# Patient Record
Sex: Female | Born: 1960 | Hispanic: No | Marital: Single | Smoking: Current every day smoker
Health system: Southern US, Community
[De-identification: ages and names within clinical notes are randomized; demographics above are authoritative.]

## PROBLEM LIST (undated history)

## (undated) ENCOUNTER — Emergency Department (HOSPITAL_BASED_OUTPATIENT_CLINIC_OR_DEPARTMENT_OTHER): Admission: EM | Payer: Self-pay | Source: Home / Self Care

## (undated) DIAGNOSIS — K8020 Calculus of gallbladder without cholecystitis without obstruction: Secondary | ICD-10-CM

## (undated) DIAGNOSIS — F419 Anxiety disorder, unspecified: Secondary | ICD-10-CM

## (undated) DIAGNOSIS — F32A Depression, unspecified: Secondary | ICD-10-CM

## (undated) DIAGNOSIS — B1920 Unspecified viral hepatitis C without hepatic coma: Secondary | ICD-10-CM

## (undated) DIAGNOSIS — K5790 Diverticulosis of intestine, part unspecified, without perforation or abscess without bleeding: Secondary | ICD-10-CM

## (undated) DIAGNOSIS — I10 Essential (primary) hypertension: Secondary | ICD-10-CM

## (undated) DIAGNOSIS — F329 Major depressive disorder, single episode, unspecified: Secondary | ICD-10-CM

## (undated) DIAGNOSIS — M1990 Unspecified osteoarthritis, unspecified site: Secondary | ICD-10-CM

## (undated) DIAGNOSIS — M25569 Pain in unspecified knee: Secondary | ICD-10-CM

## (undated) DIAGNOSIS — A539 Syphilis, unspecified: Secondary | ICD-10-CM

## (undated) DIAGNOSIS — G8929 Other chronic pain: Secondary | ICD-10-CM

## (undated) HISTORY — DX: Unspecified viral hepatitis C without hepatic coma: B19.20

## (undated) HISTORY — DX: Syphilis, unspecified: A53.9

## (undated) HISTORY — DX: Diverticulosis of intestine, part unspecified, without perforation or abscess without bleeding: K57.90

## (undated) HISTORY — PX: ECTOPIC PREGNANCY SURGERY: SHX613

## (undated) HISTORY — PX: CARPAL TUNNEL RELEASE: SHX101

## (undated) HISTORY — DX: Unspecified osteoarthritis, unspecified site: M19.90

---

## 2004-03-22 ENCOUNTER — Emergency Department: Payer: Self-pay | Admitting: Emergency Medicine

## 2004-04-01 ENCOUNTER — Ambulatory Visit: Payer: Self-pay | Admitting: *Deleted

## 2004-10-07 ENCOUNTER — Emergency Department: Payer: Self-pay | Admitting: Emergency Medicine

## 2004-11-15 ENCOUNTER — Emergency Department: Payer: Self-pay | Admitting: Emergency Medicine

## 2005-04-20 ENCOUNTER — Emergency Department: Payer: Self-pay | Admitting: Emergency Medicine

## 2005-08-20 ENCOUNTER — Other Ambulatory Visit: Admission: RE | Admit: 2005-08-20 | Discharge: 2005-08-20 | Payer: Self-pay | Admitting: Family Medicine

## 2005-11-25 ENCOUNTER — Emergency Department (HOSPITAL_COMMUNITY): Admission: EM | Admit: 2005-11-25 | Discharge: 2005-11-25 | Payer: Self-pay | Admitting: Emergency Medicine

## 2005-12-28 ENCOUNTER — Emergency Department (HOSPITAL_COMMUNITY): Admission: EM | Admit: 2005-12-28 | Discharge: 2005-12-28 | Payer: Self-pay | Admitting: Emergency Medicine

## 2006-02-04 ENCOUNTER — Encounter: Admission: RE | Admit: 2006-02-04 | Discharge: 2006-02-04 | Payer: Self-pay | Admitting: Internal Medicine

## 2006-08-17 ENCOUNTER — Encounter: Admission: RE | Admit: 2006-08-17 | Discharge: 2006-08-17 | Payer: Self-pay | Admitting: Internal Medicine

## 2006-09-10 ENCOUNTER — Emergency Department (HOSPITAL_COMMUNITY): Admission: EM | Admit: 2006-09-10 | Discharge: 2006-09-10 | Payer: Self-pay | Admitting: Family Medicine

## 2006-10-28 ENCOUNTER — Emergency Department (HOSPITAL_COMMUNITY): Admission: EM | Admit: 2006-10-28 | Discharge: 2006-10-28 | Payer: Self-pay | Admitting: Family Medicine

## 2006-12-27 ENCOUNTER — Encounter: Admission: RE | Admit: 2006-12-27 | Discharge: 2006-12-27 | Payer: Self-pay | Admitting: Neurological Surgery

## 2007-02-18 ENCOUNTER — Telehealth (INDEPENDENT_AMBULATORY_CARE_PROVIDER_SITE_OTHER): Payer: Self-pay | Admitting: *Deleted

## 2007-02-22 ENCOUNTER — Emergency Department (HOSPITAL_COMMUNITY): Admission: EM | Admit: 2007-02-22 | Discharge: 2007-02-22 | Payer: Self-pay | Admitting: Emergency Medicine

## 2007-04-03 ENCOUNTER — Emergency Department (HOSPITAL_COMMUNITY): Admission: EM | Admit: 2007-04-03 | Discharge: 2007-04-03 | Payer: Self-pay | Admitting: Emergency Medicine

## 2007-07-20 ENCOUNTER — Ambulatory Visit (HOSPITAL_COMMUNITY): Admission: RE | Admit: 2007-07-20 | Discharge: 2007-07-20 | Payer: Self-pay | Admitting: Family Medicine

## 2007-09-27 ENCOUNTER — Emergency Department (HOSPITAL_COMMUNITY): Admission: EM | Admit: 2007-09-27 | Discharge: 2007-09-27 | Payer: Self-pay | Admitting: Emergency Medicine

## 2007-12-13 ENCOUNTER — Ambulatory Visit (HOSPITAL_COMMUNITY): Admission: RE | Admit: 2007-12-13 | Discharge: 2007-12-13 | Payer: Self-pay | Admitting: Orthopaedic Surgery

## 2007-12-25 ENCOUNTER — Emergency Department: Payer: Self-pay | Admitting: Internal Medicine

## 2008-01-05 ENCOUNTER — Inpatient Hospital Stay (HOSPITAL_COMMUNITY): Admission: AD | Admit: 2008-01-05 | Discharge: 2008-01-05 | Payer: Self-pay | Admitting: Gynecology

## 2008-01-31 ENCOUNTER — Ambulatory Visit: Payer: Self-pay | Admitting: Psychiatry

## 2008-01-31 ENCOUNTER — Emergency Department (HOSPITAL_COMMUNITY): Admission: EM | Admit: 2008-01-31 | Discharge: 2008-02-01 | Payer: Self-pay | Admitting: Emergency Medicine

## 2008-02-01 ENCOUNTER — Inpatient Hospital Stay (HOSPITAL_COMMUNITY): Admission: RE | Admit: 2008-02-01 | Discharge: 2008-02-02 | Payer: Self-pay | Admitting: Psychiatry

## 2008-02-09 ENCOUNTER — Ambulatory Visit (HOSPITAL_COMMUNITY): Payer: Self-pay | Admitting: Psychiatry

## 2008-03-20 ENCOUNTER — Ambulatory Visit (HOSPITAL_COMMUNITY): Payer: Self-pay | Admitting: Psychiatry

## 2008-04-16 ENCOUNTER — Other Ambulatory Visit (HOSPITAL_COMMUNITY): Admission: RE | Admit: 2008-04-16 | Discharge: 2008-05-03 | Payer: Self-pay | Admitting: Psychiatry

## 2008-04-17 ENCOUNTER — Other Ambulatory Visit (HOSPITAL_COMMUNITY): Payer: Self-pay | Admitting: Psychiatry

## 2008-05-14 ENCOUNTER — Inpatient Hospital Stay (HOSPITAL_COMMUNITY): Admission: AD | Admit: 2008-05-14 | Discharge: 2008-05-14 | Payer: Self-pay | Admitting: Family Medicine

## 2008-08-27 ENCOUNTER — Emergency Department: Payer: Self-pay | Admitting: Emergency Medicine

## 2008-09-01 ENCOUNTER — Emergency Department: Payer: Self-pay | Admitting: Emergency Medicine

## 2008-09-09 ENCOUNTER — Emergency Department: Payer: Self-pay | Admitting: Emergency Medicine

## 2008-11-26 ENCOUNTER — Emergency Department (HOSPITAL_COMMUNITY): Admission: EM | Admit: 2008-11-26 | Discharge: 2008-11-26 | Payer: Self-pay | Admitting: Emergency Medicine

## 2009-07-20 ENCOUNTER — Emergency Department (HOSPITAL_COMMUNITY): Admission: EM | Admit: 2009-07-20 | Discharge: 2009-07-20 | Payer: Self-pay | Admitting: Emergency Medicine

## 2009-07-29 ENCOUNTER — Emergency Department (HOSPITAL_COMMUNITY): Admission: EM | Admit: 2009-07-29 | Discharge: 2009-07-29 | Payer: Self-pay | Admitting: Family Medicine

## 2010-04-02 ENCOUNTER — Emergency Department (HOSPITAL_COMMUNITY)
Admission: EM | Admit: 2010-04-02 | Discharge: 2010-04-02 | Payer: Self-pay | Source: Home / Self Care | Admitting: Emergency Medicine

## 2010-05-09 ENCOUNTER — Ambulatory Visit (HOSPITAL_COMMUNITY)
Admission: RE | Admit: 2010-05-09 | Discharge: 2010-05-09 | Payer: Self-pay | Source: Home / Self Care | Attending: Internal Medicine | Admitting: Internal Medicine

## 2010-06-15 ENCOUNTER — Encounter: Payer: Self-pay | Admitting: Family Medicine

## 2010-08-05 LAB — URINALYSIS, ROUTINE W REFLEX MICROSCOPIC
Glucose, UA: NEGATIVE mg/dL
Hgb urine dipstick: NEGATIVE
pH: 7 (ref 5.0–8.0)

## 2010-08-05 LAB — POCT CARDIAC MARKERS: Troponin i, poc: <0.05 ng/mL (ref 0.00–0.09)

## 2010-08-05 LAB — CBC
HCT: 38.4 % (ref 36.0–46.0)
Hemoglobin: 13.3 g/dL (ref 12.0–15.0)
MCH: 31.1 pg (ref 26.0–34.0)
MCV: 89.8 fL (ref 78.0–100.0)
Platelets: 229 10*3/uL (ref 150–400)
RBC: 4.28 MIL/uL (ref 3.87–5.11)
WBC: 9.1 10*3/uL (ref 4.0–10.5)

## 2010-08-05 LAB — HEPATIC FUNCTION PANEL
ALT: 15 U/L (ref 0–35)
AST: 21 U/L (ref 0–37)
Albumin: 3.2 g/dL — ABNORMAL LOW (ref 3.5–5.2)
Total Bilirubin: 0.2 mg/dL — ABNORMAL LOW (ref 0.3–1.2)

## 2010-08-05 LAB — BASIC METABOLIC PANEL
CO2: 28 mEq/L (ref 19–32)
Chloride: 105 mEq/L (ref 96–112)
GFR calc Af Amer: >60 mL/min (ref >60)
Potassium: 3.8 mEq/L (ref 3.5–5.1)
Sodium: 140 mEq/L (ref 135–145)

## 2010-08-05 LAB — PREGNANCY, URINE: Preg Test, Ur: NEGATIVE

## 2010-08-17 LAB — CBC
Hemoglobin: 14.5 g/dL (ref 12.0–15.0)
Platelets: 213 10*3/uL (ref 150–400)
RDW: 13.4 % (ref 11.5–15.5)
WBC: 8.9 10*3/uL (ref 4.0–10.5)

## 2010-08-17 LAB — POCT I-STAT, CHEM 8
BUN: 11 mg/dL (ref 6–23)
Chloride: 107 mEq/L (ref 96–112)
Glucose, Bld: 103 mg/dL — ABNORMAL HIGH (ref 70–99)
HCT: 45 % (ref 36.0–46.0)
Potassium: 4.2 mEq/L (ref 3.5–5.1)

## 2010-10-07 NOTE — Discharge Summary (Signed)
NAME:  [name], Vanessa              ACCOUNT NO.:  [id number]   MEDICAL RECORD NO.:  [id number]          PATIENT TYPE:  IPS   LOCATION:  0501                          FACILITY:  [facility]   PHYSICIAN:  Geoffery Lyons, M.D.      DATE OF BIRTH:  Dec 09, 1960   DATE OF ADMISSION:  02/01/2008  DATE OF DISCHARGE:  02/02/2008                               DISCHARGE SUMMARY   CHIEF COMPLAINT:  This was the first admission to [facility] for this 50 year old female voluntarily admitted.  History of  opiate dependence.  Has been using Vicodin up to 10 per day.  Has been  using for 4 years.  Using medication that are prescribed and buying them  off the street.  Last used the day before.  Tried to taper herself off  but developed significant withdrawal.  She has interest in going to  school and needs to get off this medication.   PAST PSYCHIATRIC HISTORY:  [facility] time at [facility].  Has been on  Zoloft in the past.   ALCOHOL AND DRUG HISTORY:  As already stated persistent use of opiates.   MEDICAL HISTORY:  Hand surgery when she got started on Vicodin.   MEDICATION:  Effexor XR, also has been on hydrochlorothiazide and  lisinopril.   Physical examination failed to show any acute findings.   LABORATORY:  Urinalysis red blood cells 3-6.  CBC within normal limits.  UDS positive for opiates.   MENTAL STATUS EXAM:  Reveals a fully alert cooperative female, good eye  contact.  Speech is clear, normal rate, tempo and production.  Endorsed  feeling weak, having some withdrawal symptoms.  Appears in no acute  distress.  No suicidal or homicidal ideas.  No hallucinations.  No  delusions.  Cognition well-preserved.   DIAGNOSES:  AXIS I:  Opiate dependence, opiate withdrawal.  AXIS II:  No diagnosis.  AXIS III:  Hypertension.  AXIS IV:  Moderate.  AXIS V:  Upon admission 35, highest GAF in the last year 70.   COURSE IN THE HOSPITAL:  She was admitted, started individual and group  psychotherapy.  We went ahead and start detox with clonidine.  She was  placed back on the Effexor.  As already stated got dependent on Vicodin  when she had surgery.  She had nausea, vomiting, diarrhea, weakness,  aches.  Has a daughter that is 89 years old.  She needs to let this go.  Has prior history of depression on Zoloft.  We started detox with  clonidine.  Detox went uneventfully.  By September 10 she felt so much  better.  Endorsed that she was ready to go, was willing to pursue the  taper back at home but said she was ready to get her life back together  and pursue outpatient followup.   AXIS I:  Opiate dependence, opiate withdrawal.  Depressive disorder not  otherwise specified.  AXIS II:  No diagnosis.  AXIS III:  Hypertension.  AXIS IV:  Moderate.  AXIS V:  Upon discharge 50/55.   Discharged home on clonidine 0.1 to taper per  protocol, Effexor XR 75 mg  per day, Librium 25 one every 6 hours as needed for anxiety, 3 capsules  given.  Follow up with Dr. Lolly Mustache and Florencia Reasons in [place].      Geoffery Lyons, M.D.  Electronically Signed     IL/MEDQ  D:  02/08/2008  T:  02/10/2008  Job:  [id number]

## 2010-10-07 NOTE — Op Note (Signed)
NAME:  Vanessa [name], Vanessa [name]              ACCOUNT NO.:  [id number]   MEDICAL RECORD NO.:  [id number]          PATIENT TYPE:  AMB   LOCATION:  DAY                           FACILITY:  [facility]   PHYSICIAN:  J. Darreld Mclean, M.D. DATE OF BIRTH:  08-Jul-1960   DATE OF PROCEDURE:  DATE OF DISCHARGE:                               OPERATIVE REPORT   PREOPERATIVE DIAGNOSIS:  Carpal tunnel syndrome, left.   POSTOPERATIVE DIAGNOSIS:  Carpal tunnel syndrome, left.   PROCEDURE:  Open release of left carpal tunnel with incision of volar  carpal ligament, saline neurolysis, epineurectomy.   ANESTHESIA:  Bier block.   TOURNIQUET TIME:  Per anesthesia.   SURGEON:  J. Darreld Mclean, MD   INDICATIONS:  The patient is a 50 year old female with pain and  tenderness in both hands, left greater than right with night  paresthesias dropping things.  She has not gotten better with  conservative treatment.  EMG show carpal tunnel syndrome on the left.  Surgery has been recommended if she is not improved with conservative  treatment.  Risks and imponderables of the procedure were discussed  preoperatively and she appears to understand and agree with the  procedure as outlined.   The patient was seen in the holding area.  She previously marked the  left wrist area, I marked the left wrist.  She was brought to the  operating room and placed supine on the operating room table.  Bier  block anesthesia was given by Anesthesia.  She was prepped and draped in  the usual manner.   We had a time-out identify the patient, identify the left hand as the  correct surgical site.  The entire staff was introduced, stated the  length of the case anticipated to make sure that all instrumentation was  properly placed, positioned, and available.   Incision was made over the volar carpal ligament area volarly and  proximally the palmaris longus was identified.  This was retracted.  The  retinaculum was entered and the median  nerve was identified proximally.  A vessel loop placed around the nerve.  A groove director was then  placed within the carpal tunnel space.  The volar carpal ligaments were  easily identified and then incised.  Nerve was obviously compressed.  The retinaculum was cut proximally.  Specimen of volar carpal ligament  was sent to pathology.  Saline neurolysis and epineurectomy carried out.  The nerve was inspected, no injury apparent.  The wound was then  reapproximated using 3-0 nylon interrupted vertical mattress manner.  The hand was cleansed with Betadine and then a sterile dressing was  applied.  Bulky dressing applied.  Ace bandage applied loosely.  The  sheet cotton was applied, was cut on the dorsal side of the hand  before application of the Ace bandage.  She tolerated the procedure  well.  Prescription of Vicodin ES given for pain.  I will see her in the  office in approximately a week for dressing change and then a week after  that for removal of sutures.  If she has any difficulty at all, she  is  to contact me through the office or through the hospital beeper system.           ______________________________  Shela Commons. Darreld Mclean, M.D.     JWK/MEDQ  D:  12/13/2007  T:  12/13/2007  Job:  [id number]

## 2010-10-07 NOTE — H&P (Signed)
NAME:  Vanessa [name], Vanessa [name]              ACCOUNT NO.:  [id number]   MEDICAL RECORD NO.:  [id number]          PATIENT TYPE:  AMB   LOCATION:  DAY                           FACILITY:  [facility]   PHYSICIAN:  J. Darreld Mclean, M.D. DATE OF BIRTH:  [birth year]   DATE OF ADMISSION:  DATE OF DISCHARGE:  LH                              HISTORY & PHYSICAL   CHIEF COMPLAINT:  Carpal tunnel syndrome left.   The patient is a 50 year old female with pain and tenderness in both  hands, particularly the left, since around February or March of this  year.  I first saw her on May 14 with complaints of pain and tenderness  more on the left than the right.  She had night pain, night  paresthesias.  She decreased sensation in the ulnar nerve.  I was  concerned about carpal tunnel syndrome more on the left than on the  right.  She underwent EMGs in Dr. Chriss Driver office on Oct 07, 2007.  EMG  showed left median mononeuropathy across the wrist consistent with  carpal tunnel syndrome without denervation.  I informed her of the  findings of the study on May 19.  She wanted to think about having any  surgery until later in the mid summer.  She returned to the office on  July 6 stating she wanted to have surgery on July 21.  The risks and  imponderables of the procedure were discussed with her.  She appeared to  understand.   PAST HISTORY:  Negative.  She denies heart disease, lung disease, kidney  disease, stroke, hypertension, diabetes, cancer, ulcer disease, or  circulatory problems.   She has no allergies.   She takes Vicodin ES for pain.   She smokes.  She does not use alcohol.   Dr. Mikeal Hawthorne is her family doctor.   She denies any previous surgery.   She denies any diseases that run in the family.   She works for [employer] [place] in [place] and lives in [place].  She is not married.   PHYSICAL EXAMINATION:  VITAL SIGNS:  Normal.  HEENT: Negative.  NECK:  Supple.  LUNGS:  Clear to P&A.  HEART:   Regular rhythm without murmur heard.  ABDOMEN:  Soft, nontender without masses.  EXTREMITIES:  Positive Phalen's, positive Tinel's on the left.  Decreased sensation in median nerve distribution.  Other extremities  negative.  CNS:  Intact except as stated.  SKIN:  Intact.   IMPRESSION:  Carpal tunnel syndrome on the left.   PLAN:  Carpal tunnel release on the left.  Labs pending.                                            ______________________________  Shela Commons. Darreld Mclean, M.D.     JWK/MEDQ  D:  12/12/2007  T:  12/12/2007  Job:  [id number]

## 2010-10-07 NOTE — H&P (Signed)
NAME:  Vanessa [name], Vanessa [name]              ACCOUNT NO.:  [id number]   MEDICAL RECORD NO.:  [id number]         PATIENT TYPE:  BIPS   LOCATION:  501                           FACILITY:  [facility]   PHYSICIAN:  Landry Corporal, N.P.    DATE OF BIRTH:  1960/07/28   DATE OF ADMISSION:  01/31/2008  DATE OF DISCHARGE:                       PSYCHIATRIC ADMISSION ASSESSMENT   This is a 50 year old female voluntarily admitted on January 31, 2008.   HISTORY OF PRESENT ILLNESS:  The patient presents with a history of  opiate addiction.  Has been using Vicodin up to 10 per day.  Has been  using for approximately 4 years.  Using medications that are prescribed  and buying them off the streets.  Her last use was yesterday.  She had  tried to taper herself off, but developed significant withdrawal  symptoms.  She has interest in going to school and needs to get off  these medications.   PAST PSYCHIATRIC HISTORY:  First admission to [facility].  Has been on Zoloft in the past.   SOCIAL HISTORY:  A 50 year old female.  She lives with her 49 year old  daughter.  She reports her financial issues are satisfactory.  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   FAMILY HISTORY:  Unknown.   ALCOHOL/DRUG HISTORY:  She denies any alcohol use.  Again has been using  opiates as above.  Denies any IV drug use.   PRIMARY CARE Rajendra Spiller:  Dr. Vedia Coffer in [facility].   MEDICAL PROBLEMS:  Had some recent hand surgery which had got her  started on the Vicodin.   MEDICATIONS:  1. Effexor SR, the dose is unclear.  2. Also was on blood pressure medicine.  3. Hydrochlorothiazide.  4. Lisinopril.  Again, the patient is unclear on her dose.   DRUG ALLERGIES:  NO KNOWN ALLERGIES.   PHYSICAL EXAMINATION:  GENERAL:  Feeling very weak this morning.  She  was assessed at [facility].  Her physical exam was  reviewed with no significant findings.   VITAL SIGNS:  The patient did  receive clonidine 0.1 mg for a blood pressure 178/108, temperature 98,  82 heart rate, 22 respirations, blood pressure is 150/96, 155 pounds,  98% saturated.   LABORATORY DATA:  Urinalysis shows RBCs 3-6.  CBC within normal limits.  Urine drug screen is positive for opiates.  Alcohol level less than 5.   MENTAL STATUS EXAM:  She is fully alert, cooperative, good eye contact.  Her speech is clear, normal pace and tone.  The patient's mood is again  feeling weak and having some withdrawal symptoms.  The patient appears  in no acute distress.  Thought processes are coherent.  No evidence of  psychotic symptoms.  Cognitive function intact.  Her memory is good.  Judgment and insight is good.   AXIS I:  Opiate dependence.  AXIS II:  Deferred.  AXIS III:  Hypertension.  AXIS IV:  Psychosocial problems related to substance use.  AXIS V:  Current is 45-50.   PLAN:  Detox the patient  with the clonidine protocol.  We will work on  relapse prevention.  We will continue to assess comorbidities.  Case  manager and interdisciplinary team will assess her follow up plan along  with the patient.  Will have trazodone as well for sleep.  Her tentative  length of stay is 3-4 days.      Landry Corporal, N.P.     JO/MEDQ  D:  02/01/2008  T:  02/01/2008  Job:  [id number]

## 2010-11-03 ENCOUNTER — Other Ambulatory Visit: Payer: Self-pay | Admitting: Internal Medicine

## 2010-11-03 DIAGNOSIS — Z1231 Encounter for screening mammogram for malignant neoplasm of breast: Secondary | ICD-10-CM

## 2010-11-07 ENCOUNTER — Inpatient Hospital Stay: Admission: RE | Admit: 2010-11-07 | Payer: Self-pay | Source: Ambulatory Visit

## 2011-02-20 LAB — DIFFERENTIAL
Basophils Absolute: 0.1
Basophils Relative: 1
Eosinophils Absolute: 0.1
Eosinophils Relative: 1
Lymphocytes Relative: 30
Lymphs Abs: 2.7
Monocytes Absolute: 0.3
Monocytes Relative: 3
Neutro Abs: 5.8
Neutrophils Relative %: 65

## 2011-02-20 LAB — URINALYSIS, ROUTINE W REFLEX MICROSCOPIC
Bilirubin Urine: NEGATIVE
Bilirubin Urine: NEGATIVE
Glucose, UA: NEGATIVE
Hgb urine dipstick: NEGATIVE
Hgb urine dipstick: NEGATIVE
Ketones, ur: NEGATIVE
Nitrite: NEGATIVE
Protein, ur: NEGATIVE
Protein, ur: NEGATIVE
Urobilinogen, UA: 0.2
Urobilinogen, UA: 0.2

## 2011-02-20 LAB — WET PREP, GENITAL
Trich, Wet Prep: NONE SEEN
Yeast Wet Prep HPF POC: NONE SEEN

## 2011-02-20 LAB — CBC
HCT: 36.6
Hemoglobin: 12.6
MCHC: 34.4
MCV: 92.9
Platelets: 233
RBC: 3.94
RDW: 13.3
WBC: 8.9

## 2011-02-20 LAB — COMPREHENSIVE METABOLIC PANEL
Alkaline Phosphatase: 30 — ABNORMAL LOW
BUN: 13
Chloride: 107
GFR calc non Af Amer: >60
Glucose, Bld: 129 — ABNORMAL HIGH
Potassium: 4.1
Total Bilirubin: 0.5

## 2011-02-20 LAB — HCG, QUANTITATIVE, PREGNANCY: hCG, Beta Chain, Quant, S: <2

## 2011-02-20 LAB — POCT PREGNANCY, URINE: Preg Test, Ur: NEGATIVE

## 2011-02-24 LAB — OPIATE, QUANTITATIVE, URINE
Hydrocodone: NEGATIVE ng/mL
Hydromorphone GC/MS Conf: NEGATIVE ng/mL
Oxycodone, ur: >5000 ng/mL
Oxymorphone: 4300 ng/mL

## 2011-02-24 LAB — BENZODIAZEPINE, QUANTITATIVE, URINE: Alprazolam (GC/LC/MS), ur confirm: NEGATIVE

## 2011-02-24 LAB — URINE DRUGS OF ABUSE SCREEN W ALC, ROUTINE (REF LAB)
Barbiturate Quant, Ur: NEGATIVE
Cocaine Metabolites: NEGATIVE
Ethyl Alcohol: <10 mg/dL (ref <10)
Opiate Screen, Urine: POSITIVE — AB
Phencyclidine (PCP): NEGATIVE

## 2011-02-25 LAB — URINALYSIS, ROUTINE W REFLEX MICROSCOPIC
Bilirubin Urine: NEGATIVE
Glucose, UA: NEGATIVE
Ketones, ur: NEGATIVE
Leukocytes, UA: NEGATIVE
Nitrite: NEGATIVE
Protein, ur: NEGATIVE
Specific Gravity, Urine: 1.024
Urobilinogen, UA: 0.2
pH: 5.5

## 2011-02-25 LAB — COMPREHENSIVE METABOLIC PANEL
ALT: 16
AST: 17
Albumin: 3.3 — ABNORMAL LOW
Calcium: 8.7
GFR calc Af Amer: >60
Sodium: 140
Total Protein: 6.7

## 2011-02-25 LAB — RAPID URINE DRUG SCREEN, HOSP PERFORMED
Amphetamines: NOT DETECTED
Barbiturates: NOT DETECTED
Benzodiazepines: NOT DETECTED
Cocaine: NOT DETECTED
Opiates: POSITIVE — AB
Tetrahydrocannabinol: NOT DETECTED

## 2011-02-25 LAB — DIFFERENTIAL
Basophils Absolute: 0.1
Basophils Relative: 1
Eosinophils Absolute: 0.1
Eosinophils Relative: 1
Lymphocytes Relative: 33
Lymphs Abs: 2.8
Monocytes Absolute: 0.3
Monocytes Relative: 4
Neutro Abs: 5.2
Neutrophils Relative %: 61

## 2011-02-25 LAB — URINE MICROSCOPIC-ADD ON

## 2011-02-25 LAB — CBC
MCHC: 33.5
RDW: 14

## 2011-02-27 LAB — URINALYSIS, ROUTINE W REFLEX MICROSCOPIC
Glucose, UA: NEGATIVE mg/dL
Ketones, ur: NEGATIVE mg/dL
pH: 5.5 (ref 5.0–8.0)

## 2011-02-27 LAB — WET PREP, GENITAL: Clue Cells Wet Prep HPF POC: NONE SEEN

## 2011-03-03 LAB — BASIC METABOLIC PANEL
BUN: 6
CO2: 25
Chloride: 108
Glucose, Bld: 108 — ABNORMAL HIGH
Potassium: 4

## 2011-03-03 LAB — PREGNANCY, URINE: Preg Test, Ur: NEGATIVE

## 2011-03-03 LAB — URINALYSIS, ROUTINE W REFLEX MICROSCOPIC
Bilirubin Urine: NEGATIVE
Glucose, UA: NEGATIVE
Hgb urine dipstick: NEGATIVE
Ketones, ur: NEGATIVE
Nitrite: NEGATIVE
pH: 6

## 2011-03-03 LAB — DIFFERENTIAL
Basophils Absolute: 0
Eosinophils Absolute: 0
Eosinophils Relative: 0
Monocytes Absolute: 0.1 — ABNORMAL LOW

## 2011-03-03 LAB — CBC
HCT: 43.4
MCHC: 34
MCV: 90.9
Platelets: 230
RDW: 13.2

## 2011-03-05 LAB — URINALYSIS, ROUTINE W REFLEX MICROSCOPIC
Bilirubin Urine: NEGATIVE
Ketones, ur: NEGATIVE
Nitrite: POSITIVE — AB
Protein, ur: NEGATIVE

## 2011-03-05 LAB — I-STAT 8, (EC8 V) (CONVERTED LAB)
BUN: 11
Bicarbonate: 26.1 — ABNORMAL HIGH
Chloride: 107
Hemoglobin: 14.3
Operator id: 265201
Potassium: 4.2
Sodium: 139

## 2011-03-05 LAB — CBC
HCT: 38.2
Hemoglobin: 12.9
MCV: 91.5
RBC: 4.17
WBC: 5.7

## 2011-03-05 LAB — RPR: RPR Ser Ql: NONREACTIVE

## 2011-03-05 LAB — DIFFERENTIAL
Eosinophils Absolute: 0.1
Eosinophils Relative: 2
Lymphocytes Relative: 40
Lymphs Abs: 2.3
Monocytes Absolute: 0.3
Monocytes Relative: 5

## 2011-03-05 LAB — URINE MICROSCOPIC-ADD ON

## 2011-08-24 ENCOUNTER — Ambulatory Visit: Payer: Self-pay

## 2011-11-05 ENCOUNTER — Ambulatory Visit
Admission: RE | Admit: 2011-11-05 | Discharge: 2011-11-05 | Disposition: A | Discharge status: Home / Self Care | Payer: Medicaid Other | Source: Ambulatory Visit | Attending: Internal Medicine | Admitting: Internal Medicine

## 2011-11-05 DIAGNOSIS — Z1231 Encounter for screening mammogram for malignant neoplasm of breast: Secondary | ICD-10-CM

## 2012-07-16 ENCOUNTER — Encounter (HOSPITAL_BASED_OUTPATIENT_CLINIC_OR_DEPARTMENT_OTHER): Payer: Self-pay | Admitting: *Deleted

## 2012-07-16 ENCOUNTER — Emergency Department (HOSPITAL_BASED_OUTPATIENT_CLINIC_OR_DEPARTMENT_OTHER): Payer: BC Managed Care – PPO

## 2012-07-16 ENCOUNTER — Emergency Department (HOSPITAL_BASED_OUTPATIENT_CLINIC_OR_DEPARTMENT_OTHER)
Admission: EM | Admit: 2012-07-16 | Discharge: 2012-07-16 | Disposition: A | Discharge status: Home / Self Care | Payer: BC Managed Care – PPO | Attending: Emergency Medicine | Admitting: Emergency Medicine

## 2012-07-16 DIAGNOSIS — I10 Essential (primary) hypertension: Secondary | ICD-10-CM | POA: Insufficient documentation

## 2012-07-16 DIAGNOSIS — L0390 Cellulitis, unspecified: Secondary | ICD-10-CM

## 2012-07-16 DIAGNOSIS — F17200 Nicotine dependence, unspecified, uncomplicated: Secondary | ICD-10-CM | POA: Insufficient documentation

## 2012-07-16 DIAGNOSIS — Z79899 Other long term (current) drug therapy: Secondary | ICD-10-CM | POA: Insufficient documentation

## 2012-07-16 DIAGNOSIS — L02619 Cutaneous abscess of unspecified foot: Secondary | ICD-10-CM | POA: Insufficient documentation

## 2012-07-16 HISTORY — DX: Essential (primary) hypertension: I10

## 2012-07-16 MED ORDER — CLINDAMYCIN HCL 150 MG PO CAPS: 300.0000 mg | ORAL_CAPSULE | Freq: Three times a day (TID) | ORAL | Status: DC

## 2012-07-16 MED ORDER — IBUPROFEN 800 MG PO TABS
800.0000 mg | ORAL_TABLET | Freq: Once | ORAL | Status: AC
  Administered 2012-07-16: 800 mg via ORAL
  Filled 2012-07-16: qty 1

## 2012-07-16 NOTE — ED Notes (Signed)
ED PAC at bedside. 

## 2012-07-16 NOTE — ED Notes (Addendum)
Pt states she may have injured her left foot while at work last Tuesday "trying to help everybody else" Takes medication for BP and goes to pain clinic, but does not know name of meds taken.

## 2012-07-16 NOTE — ED Provider Notes (Signed)
Medical screening examination/treatment/procedure(s) were conducted as a shared visit with non-physician practitioner(s) and myself.  I personally evaluated the patient during the encounter  Derwood Kaplan, MD 07/16/12 2338

## 2012-07-16 NOTE — ED Provider Notes (Signed)
History     CSN: [id number]  Arrival date & time 07/16/12  1441   First MD Initiated Contact with Patient 07/16/12 1525      Chief Complaint  Patient presents with  . Foot Injury    (Consider location/radiation/quality/duration/timing/severity/associated sxs/prior treatment) HPI Comments: Patient is a 52 year old female who presents with left foot pain for the past 5 days. Patient reports the pain started gradually and progressively worsened since the onset. The pain is dull and moderate without radiation. Patient reports associated redness and swelling. Weight bearing activity makes the pain worse. Nothing makes the pain better. Patient has not tried anything for pain relief. No known injury.   Past Medical History  Diagnosis Date  . Hypertension     History reviewed. No pertinent past surgical history.  History reviewed. No pertinent family history.  History  Substance Use Topics  . Smoking status: Current Every Day Smoker  . Smokeless tobacco: Not on file  . Alcohol Use: No    OB History   Grav Para Term Preterm Abortions TAB SAB Ect Mult Living                  Review of Systems  Musculoskeletal: Positive for joint swelling and arthralgias.  All other systems reviewed and are negative.    Allergies  Review of patient's allergies indicates no known allergies.  Home Medications   Current Outpatient Rx  Name  Route  Sig  Dispense  Refill  . ALPRAZolam (XANAX) 0.5 MG tablet   Oral   Take 0.5 mg by mouth at bedtime as needed for sleep.           BP 125/83  Pulse 104  Temp(Src) 98.1 F (36.7 C) (Oral)  Resp 20  Ht 5\' 1"  (1.549 m)  Wt 160 lb (72.576 kg)  BMI 30.25 kg/m2  SpO2 98%  Physical Exam  Nursing note and vitals reviewed. Constitutional: She is oriented to person, place, and time. She appears well-developed and well-nourished. No distress.  HENT:  Head: Normocephalic and atraumatic.  Eyes: Conjunctivae are normal.  Neck: Normal range of  motion.  Cardiovascular: Normal rate and regular rhythm.  Exam reveals no gallop and no friction rub.   No murmur heard. Pulmonary/Chest: Effort normal and breath sounds normal. She has no wheezes. She has no rales. She exhibits no tenderness.  Abdominal: Soft. There is no tenderness.  Musculoskeletal: Normal range of motion.  Localized swelling to dorsal left foot that is tender to palpation with associated erythema.   Neurological: She is alert and oriented to person, place, and time. Coordination normal.  Speech is goal-oriented. Moves limbs without ataxia.   Skin: Skin is warm and dry.  See musculoskeletal.   Psychiatric: She has a normal mood and affect. Her behavior is normal.    ED Course  Procedures (including critical care time)  Labs Reviewed - No data to display Dg Foot Complete Left  07/16/2012  *RADIOLOGY REPORT*  Clinical Data: Foot injury  LEFT FOOT - COMPLETE 3+ VIEW  Comparison: None.  Findings: Three views of the left foot submitted.  No acute fracture or subluxation.  No radiopaque foreign body.  Tiny plantar spur of the calcaneus.  IMPRESSION: No acute fracture or subluxation.   Original Report Authenticated By: Natasha Mead, M.D.      1. Cellulitis       MDM  4:26 PM Patient given ibuprofen for pain. Xray unremarkable for acute changes. No signs of neurovascular compromise.  4:47 PM Dr. Rhunette Croft saw the patient who thinks the patient has an infectious etiology. I will order US soft tissue.   6:08 PM Ultrasound shows no abscess in the presence of soft tissue swelling indicating cellulitis. Patient will be treated with an antibiotic. Patient is afebrile and vitals stable for discharge. No further evaluation needed at this time. Patient instructed to return with worsening or concerning symptoms.    Emilia Beck, PA-C 07/16/12 1812

## 2012-10-23 LAB — HM MAMMOGRAPHY

## 2013-02-27 ENCOUNTER — Emergency Department (HOSPITAL_COMMUNITY)
Admission: EM | Admit: 2013-02-27 | Discharge: 2013-02-27 | Discharge status: Against Medical Advice | Payer: BC Managed Care – PPO | Attending: Emergency Medicine | Admitting: Emergency Medicine

## 2013-02-27 ENCOUNTER — Encounter (HOSPITAL_COMMUNITY): Payer: Self-pay | Admitting: Emergency Medicine

## 2013-02-27 ENCOUNTER — Emergency Department (HOSPITAL_COMMUNITY): Payer: BC Managed Care – PPO

## 2013-02-27 DIAGNOSIS — R1110 Vomiting, unspecified: Secondary | ICD-10-CM | POA: Insufficient documentation

## 2013-02-27 DIAGNOSIS — R1013 Epigastric pain: Secondary | ICD-10-CM | POA: Insufficient documentation

## 2013-02-27 DIAGNOSIS — I10 Essential (primary) hypertension: Secondary | ICD-10-CM | POA: Insufficient documentation

## 2013-02-27 DIAGNOSIS — R072 Precordial pain: Secondary | ICD-10-CM | POA: Insufficient documentation

## 2013-02-27 DIAGNOSIS — F17200 Nicotine dependence, unspecified, uncomplicated: Secondary | ICD-10-CM | POA: Insufficient documentation

## 2013-02-27 HISTORY — DX: Depression, unspecified: F32.A

## 2013-02-27 HISTORY — DX: Major depressive disorder, single episode, unspecified: F32.9

## 2013-02-27 NOTE — ED Notes (Signed)
Pt c/o mid sternal CP and epigastric pain x 3 days; pt sts switched depression meds last week and not feeling well since; pt sts vomiting x 3 days

## 2013-02-28 ENCOUNTER — Emergency Department: Payer: Self-pay | Admitting: Emergency Medicine

## 2013-02-28 LAB — TROPONIN I: Troponin-I: <0.02 ng/mL

## 2013-02-28 LAB — BASIC METABOLIC PANEL
Anion Gap: 5 — ABNORMAL LOW (ref 7–16)
Calcium, Total: 8.6 mg/dL (ref 8.5–10.1)
Chloride: 101 mmol/L (ref 98–107)
Co2: 30 mmol/L (ref 21–32)
EGFR (African American): >60
EGFR (Non-African Amer.): >60
Glucose: 114 mg/dL — ABNORMAL HIGH (ref 65–99)
Osmolality: 272 (ref 275–301)
Potassium: 3.5 mmol/L (ref 3.5–5.1)

## 2013-02-28 LAB — CBC
HCT: 40.1 % (ref 35.0–47.0)
MCH: 29.7 pg (ref 26.0–34.0)
MCHC: 34.2 g/dL (ref 32.0–36.0)
WBC: 9.4 10*3/uL (ref 3.6–11.0)

## 2013-02-28 LAB — URINALYSIS, COMPLETE
Bacteria: NONE SEEN
Glucose,UR: NEGATIVE mg/dL (ref 0–75)
Ketone: NEGATIVE
Leukocyte Esterase: NEGATIVE
Protein: NEGATIVE
Squamous Epithelial: 3
WBC UR: 1 /HPF (ref 0–5)

## 2013-03-27 DIAGNOSIS — F32A Depression, unspecified: Secondary | ICD-10-CM | POA: Insufficient documentation

## 2013-03-27 DIAGNOSIS — F329 Major depressive disorder, single episode, unspecified: Secondary | ICD-10-CM | POA: Insufficient documentation

## 2013-03-27 DIAGNOSIS — I10 Essential (primary) hypertension: Secondary | ICD-10-CM | POA: Insufficient documentation

## 2013-03-28 ENCOUNTER — Encounter: Payer: BC Managed Care – PPO | Admitting: Interventional Cardiology

## 2013-04-18 ENCOUNTER — Encounter: Payer: BC Managed Care – PPO | Admitting: Interventional Cardiology

## 2013-04-21 ENCOUNTER — Encounter: Payer: Self-pay | Admitting: Interventional Cardiology

## 2013-04-26 ENCOUNTER — Telehealth: Payer: Self-pay

## 2013-04-26 NOTE — Telephone Encounter (Signed)
New patient Left message for call back identifiable

## 2013-04-27 ENCOUNTER — Ambulatory Visit (INDEPENDENT_AMBULATORY_CARE_PROVIDER_SITE_OTHER): Payer: BC Managed Care – PPO | Admitting: Family Medicine

## 2013-04-27 ENCOUNTER — Encounter: Payer: Self-pay | Admitting: Gastroenterology

## 2013-04-27 ENCOUNTER — Encounter: Payer: Self-pay | Admitting: Family Medicine

## 2013-04-27 VITALS — BP 134/82 | HR 78 | Temp 98.3°F | Resp 18 | Ht 61.0 in | Wt 176.8 lb

## 2013-04-27 DIAGNOSIS — F1920 Other psychoactive substance dependence, uncomplicated: Secondary | ICD-10-CM

## 2013-04-27 DIAGNOSIS — R151 Fecal smearing: Secondary | ICD-10-CM

## 2013-04-27 DIAGNOSIS — F329 Major depressive disorder, single episode, unspecified: Secondary | ICD-10-CM

## 2013-04-27 DIAGNOSIS — B182 Chronic viral hepatitis C: Secondary | ICD-10-CM

## 2013-04-27 DIAGNOSIS — F32A Depression, unspecified: Secondary | ICD-10-CM

## 2013-04-27 DIAGNOSIS — Z8619 Personal history of other infectious and parasitic diseases: Secondary | ICD-10-CM | POA: Insufficient documentation

## 2013-04-27 DIAGNOSIS — IMO0002 Reserved for concepts with insufficient information to code with codable children: Secondary | ICD-10-CM

## 2013-04-27 DIAGNOSIS — F1911 Other psychoactive substance abuse, in remission: Secondary | ICD-10-CM | POA: Insufficient documentation

## 2013-04-27 DIAGNOSIS — I10 Essential (primary) hypertension: Secondary | ICD-10-CM

## 2013-04-27 DIAGNOSIS — K649 Unspecified hemorrhoids: Secondary | ICD-10-CM

## 2013-04-27 MED ORDER — VENLAFAXINE HCL ER 150 MG PO CP24: 150.0000 mg | ORAL_CAPSULE | Freq: Every day | ORAL | Status: DC

## 2013-04-27 NOTE — Progress Notes (Signed)
   Subjective:    Patient ID: Vanessa [name], female    DOB: 31-Aug-1960, 52 y.o.   MRN: [id number]  HPI Pre visit review using our clinic review tool, if applicable. No additional management support is needed unless otherwise documented below in the visit note.  New to establish.  Previous MD- Vanessa [name]  HTN- chronic problem, dx'd ~7 yrs ago.  On Benazepril HCTZ.  No current CP- had CP earlier this year when she was started on a new depression med.  Went to ER, had normal work up in [employer].  Was referred to Cardiology but missed appt.  No SOB, HAs, visual changes, edema.  Depression- chronic problem, on Venlafaxine and Alprazolam.  Pt doesn't feel like depression is well controlled.  'i go to work, i go home.  i don't do nothing'.  Very worried about her daughter- has mental health issues.  Has not seen psychiatrist.  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  Has never received treatment.  Substance abuse- previously used crack cocaine, ETOH.  'got my life together after my daughter was born'.  Got hooked on 'pain killers' and is currently on Suboxone (managed by [facility]).  Pt wants to wean off Suboxone and doesn't feel that Vanessa [name] is going to help her wean.  Bleeding hemorrhoids- hemorrhoids are so large that pt is having fecal leakage and bleeding when they flare.  Last flare 1 week ago.  Hemorrhoids are not painful.  sxs 1st became severe 4-6 months ago.   Review of Systems For ROS see HPI     Objective:   Physical Exam  Vitals reviewed. Constitutional: She is oriented to person, place, and time. She appears well-developed and well-nourished. No distress.  HENT:  Head: Normocephalic and atraumatic.  Eyes: Conjunctivae and EOM are normal. Pupils are equal, round, and reactive to light.  Neck: Normal range of motion. Neck supple. No thyromegaly present.  Cardiovascular: Normal rate, regular rhythm, normal heart sounds and intact distal pulses.   No murmur  heard. Pulmonary/Chest: Effort normal and breath sounds normal. No respiratory distress.  Abdominal: Soft. She exhibits no distension. There is no tenderness.  Musculoskeletal: She exhibits no edema.  Lymphadenopathy:    She has no cervical adenopathy.  Neurological: She is alert and oriented to person, place, and time.  Skin: Skin is warm and dry.  Psychiatric: She has a normal mood and affect. Her behavior is normal.          Assessment & Plan:

## 2013-04-27 NOTE — Patient Instructions (Signed)
Follow up in 1 month to recheck mood Increase the Effexor to 150mg  daily- 2 of your current med and 1 of the new script Continue the Benazepril daily for BP We'll call you with your GI, pain management, and Hepatitis appts We'll notify you of your lab results and make any changes if needed Call with any questions or concerns Welcome!  We're glad to have you!! Happy Holidays!!

## 2013-04-27 NOTE — Progress Notes (Signed)
Pre visit review using our clinic review tool, if applicable. No additional management support is needed unless otherwise documented below in the visit note. 

## 2013-04-28 ENCOUNTER — Encounter: Payer: Self-pay | Admitting: General Practice

## 2013-04-28 LAB — HEPATIC FUNCTION PANEL
AST: 30 U/L (ref 0–37)
Alkaline Phosphatase: 50 U/L (ref 39–117)
Total Bilirubin: 0.3 mg/dL (ref 0.3–1.2)

## 2013-04-28 LAB — CBC WITH DIFFERENTIAL/PLATELET
Eosinophils Absolute: 0.1 10*3/uL (ref 0.0–0.7)
Eosinophils Relative: 1.2 % (ref 0.0–5.0)
Lymphocytes Relative: 35.5 % (ref 12.0–46.0)
MCHC: 33.1 g/dL (ref 30.0–36.0)
MCV: 88.6 fl (ref 78.0–100.0)
Monocytes Absolute: 0.3 10*3/uL (ref 0.1–1.0)
Neutrophils Relative %: 58.8 % (ref 43.0–77.0)
Platelets: 229 10*3/uL (ref 150.0–400.0)
WBC: 8 10*3/uL (ref 4.5–10.5)

## 2013-04-28 LAB — LIPID PANEL
HDL: 43.1 mg/dL (ref >39.00)
LDL Cholesterol: 131 mg/dL — ABNORMAL HIGH (ref 0–99)
Total CHOL/HDL Ratio: 4
VLDL: 19.4 mg/dL (ref 0.0–40.0)

## 2013-04-28 LAB — BASIC METABOLIC PANEL
Chloride: 102 mEq/L (ref 96–112)
GFR: 98.33 mL/min (ref >60.00)
Potassium: 3.8 mEq/L (ref 3.5–5.1)
Sodium: 137 mEq/L (ref 135–145)

## 2013-04-30 NOTE — Assessment & Plan Note (Signed)
New.  Heme (-) in office today.  Refer to GI for evaluation and tx.

## 2013-04-30 NOTE — Assessment & Plan Note (Signed)
New.  Suspect this occurs when internal hemorrhoids get large/inflammed and she has leakage around the hemorrhoids due to rectal distension.  Refer to GI for complete evaluation and tx.

## 2013-04-30 NOTE — Assessment & Plan Note (Signed)
New to provider, ongoing for pt.  Adequate control.  Asymptomatic.  Check labs.  No anticipated med changes. 

## 2013-04-30 NOTE — Assessment & Plan Note (Signed)
New to provider, ongoing for pt.  Check LFTs.  Refer to hep C clinic.

## 2013-04-30 NOTE — Assessment & Plan Note (Signed)
New to provider.  Ongoing for pt.  Not well controlled.  Will increase the dose of Effexor and monitor for improvement.

## 2013-04-30 NOTE — Assessment & Plan Note (Signed)
New to provider, chronic for pt.  Currently on suboxone.  Would like to wean but doesn't feel current pain management office is willing to work w/ her to accomplish this.  Will refer to new provider.

## 2013-05-01 LAB — HEPATITIS C RNA QUANTITATIVE: HCV Quantitative: 13360057 IU/mL — ABNORMAL HIGH (ref <15)

## 2013-05-09 ENCOUNTER — Ambulatory Visit (INDEPENDENT_AMBULATORY_CARE_PROVIDER_SITE_OTHER): Payer: BC Managed Care – PPO | Admitting: Gastroenterology

## 2013-05-09 ENCOUNTER — Encounter: Payer: Self-pay | Admitting: Gastroenterology

## 2013-05-09 VITALS — BP 126/80 | HR 84 | Ht 61.0 in | Wt 179.4 lb

## 2013-05-09 DIAGNOSIS — K625 Hemorrhage of anus and rectum: Secondary | ICD-10-CM

## 2013-05-09 DIAGNOSIS — Z800 Family history of malignant neoplasm of digestive organs: Secondary | ICD-10-CM

## 2013-05-09 MED ORDER — MOVIPREP 100 G PO SOLR: 1.0000 | Freq: Once | ORAL | Status: DC

## 2013-05-09 NOTE — Progress Notes (Signed)
HPI: This is a   very pleasant 52 year old woman whom I am meeting for the first time today.  Recent cbc, cmet, 04/2013 were normal.  Fecal soilage, leakage. Occurs about every other week.  Tends to be constipated.  Can go 4-5 days without BM.  Reallly has to push and strain.  HAs tried stool softners.  Tried fiber a while ago, not much improvement.  She cannot tell if she has hemorrhoids.  Saw blood in her stool 3-4 months ago.  Never had colonoscopy.  Her brother has colon cancer.  Other brother died of esophageal cancer.  Overall stable weight.  Review of systems: Pertinent positive and negative review of systems were noted in the above HPI section. Complete review of systems was performed and was otherwise normal.    Past Medical History  Diagnosis Date  . Hypertension   . Depression   . Hepatitis C   . Frequent headaches   . History of heroin abuse     Past Surgical History  Procedure Laterality Date  . Ectopic pregnancy surgery    . Carpal tunnel release Left     Current Outpatient Prescriptions  Medication Sig Dispense Refill  . ALPRAZolam (XANAX) 0.5 MG tablet Take 0.5 mg by mouth at bedtime as needed for sleep.      . benazepril-hydrochlorthiazide (LOTENSIN HCT) 10-12.5 MG per tablet Take 1 tablet by mouth daily.      . fluticasone (FLONASE) 50 MCG/ACT nasal spray As directed      . ibuprofen (ADVIL,MOTRIN) 800 MG tablet Take 1 tablet by mouth 2 (two) times daily.      . SUBOXONE 8-2 MG FILM Place 0.5 strips under the tongue 2 (two) times daily.      Marland Kitchen venlafaxine XR (EFFEXOR-XR) 150 MG 24 hr capsule Take 1 capsule (150 mg total) by mouth daily with breakfast.  30 capsule  3   No current facility-administered medications for this visit.    Allergies as of 05/09/2013  . (No Known Allergies)    Family History  Problem Relation Age of Onset  . Drug abuse Brother   . Esophageal cancer Brother   . Hypertension Mother   . Colon cancer Brother   .  Hypertension Sister     x's 2  . Diabetes Sister     oldest sister  . Diabetes Mother     History   Social History  . Marital Status: Single    Spouse Name: N/A    Number of Children: 1  . Years of Education: N/A   Occupational History  . Dialysis Tech    Social History Main Topics  . Smoking status: Current Every Day Smoker  . Smokeless tobacco: Never Used  . Alcohol Use: No  . Drug Use: No  . Sexual Activity: Not on file   Other Topics Concern  . Not on file   Social History Narrative  . No narrative on file       Physical Exam: BP 126/80  Pulse 84  Ht 5\' 1"  (1.549 m)  Wt 179 lb 6.4 oz (81.375 kg)  BMI 33.91 kg/m2 Constitutional: generally well-appearing Psychiatric: alert and oriented x3 Eyes: extraocular movements intact Mouth: oral pharynx moist, no lesions Neck: supple no lymphadenopathy Cardiovascular: heart regular rate and rhythm Lungs: clear to auscultation bilaterally Abdomen: soft, nontender, nondistended, no obvious ascites, no peritoneal signs, normal bowel sounds Extremities: no lower extremity edema bilaterally Skin: no lesions on visible extremities Rectal exam deferred for upcoming  colonoscopy   Assessment and plan: 52 y.o. female with  constipation, intermittent fecal soilage, one episode of rectal bleeding 3-4 months ago., Family history of colon cancer  Her brother had colon cancer. She needs colonoscopy for screening and also to help explain her constipation, intermittent fecal soilage. Minor rectal bleeding. She is going to start fiber supplements on a daily basis in the meantime. I see no reason for any further blood tests or imaging studies prior to then.

## 2013-05-09 NOTE — Patient Instructions (Addendum)
One of your biggest health concerns is your smoking.  This increases your risk for most cancers and serious cardiovascular diseases such as strokes, heart attacks.  You should try your best to stop.  If you need assistance, please contact your PCP or Smoking Cessation Class at [facility] [phone]) or [facility] Quit-Line (1-800-QUIT-NOW). Please start taking citrucel (orange flavored) powder fiber supplement.  This may cause some bloating at first but that usually goes away. Begin with a small spoonful and work your way up to a large, heaping spoonful daily over a week. You will be set up for a colonoscopy (LEC, mac sedation).

## 2013-05-15 ENCOUNTER — Encounter: Payer: Self-pay | Admitting: Gastroenterology

## 2013-05-19 ENCOUNTER — Ambulatory Visit: Payer: BC Managed Care – PPO | Admitting: Nurse Practitioner

## 2013-05-19 ENCOUNTER — Encounter: Payer: Self-pay | Admitting: Family Medicine

## 2013-05-19 ENCOUNTER — Ambulatory Visit (INDEPENDENT_AMBULATORY_CARE_PROVIDER_SITE_OTHER): Payer: BC Managed Care – PPO | Admitting: Family Medicine

## 2013-05-19 VITALS — BP 133/93 | HR 92 | Temp 98.4°F | Resp 18 | Ht 61.0 in | Wt 179.0 lb

## 2013-05-19 DIAGNOSIS — J069 Acute upper respiratory infection, unspecified: Secondary | ICD-10-CM

## 2013-05-19 DIAGNOSIS — J029 Acute pharyngitis, unspecified: Secondary | ICD-10-CM

## 2013-05-19 NOTE — Progress Notes (Signed)
OFFICE NOTE  05/19/2013  CC:  Chief Complaint  Patient presents with  . Sinusitis    x couple days  . Otalgia     HPI: Patient is a 52 y.o. Caucasian female who is here for 2d hx of cough, nasal congestion/mucous primarily in mornings, pressure in head.  No fever.  +ST.   Ibuprofen taken, nothing else.  Decreased appetite.  No n/v/d. Smoker: current. She did get a flu vaccine this season.  Pertinent PMH:  Past Medical History  Diagnosis Date  . Hypertension   . Depression   . Hepatitis C   . Frequent headaches   . History of heroin abuse     MEDS:  Outpatient Prescriptions Prior to Visit  Medication Sig Dispense Refill  . ALPRAZolam (XANAX) 0.5 MG tablet Take 0.5 mg by mouth at bedtime as needed for sleep.      . benazepril-hydrochlorthiazide (LOTENSIN HCT) 10-12.5 MG per tablet Take 1 tablet by mouth daily.      Marland Kitchen ibuprofen (ADVIL,MOTRIN) 800 MG tablet Take 1 tablet by mouth 2 (two) times daily.      . SUBOXONE 8-2 MG FILM Place 0.5 strips under the tongue 2 (two) times daily.      Marland Kitchen venlafaxine XR (EFFEXOR-XR) 150 MG 24 hr capsule Take 1 capsule (150 mg total) by mouth daily with breakfast.  30 capsule  3  . fluticasone (FLONASE) 50 MCG/ACT nasal spray As directed      . MOVIPREP 100 G SOLR Take 1 kit (200 g total) by mouth once.  1 kit  0   No facility-administered medications prior to visit.    PE: Blood pressure 133/93, pulse 92, temperature 98.4 F (36.9 C), temperature source Temporal, resp. rate 18, height 5\' 1"  (1.549 m), weight 179 lb (81.194 kg), SpO2 94.00%. VS: noted--normal. Gen: alert, NAD, NONTOXIC APPEARING. HEENT: eyes without injection, drainage, or swelling.  Ears: EACs clear, TMs with normal light reflex and landmarks.  Nose: Clear rhinorrhea, with some dried, crusty exudate adherent to mildly injected mucosa.  No purulent d/c.  No paranasal sinus TTP.  No facial swelling.  Throat and mouth without focal lesion.  No pharyngial swelling, erythema,  or exudate.   Neck: supple, no LAD.   LUNGS: CTA bilat, nonlabored resps.   CV: RRR, no m/r/g. EXT: no c/c/e SKIN: no rash  LAB: rapid strep negative  IMPRESSION AND PLAN:  Viral URI, symptomatic care discussed. Add robitussin DM and allegra D. Push fluids, rest. Signs/symptoms to call or return for were reviewed and pt expressed understanding.  An After Visit Summary was printed and given to the patient.  FOLLOW UP: prn

## 2013-05-19 NOTE — Progress Notes (Signed)
Pre visit review using our clinic review tool, if applicable. No additional management support is needed unless otherwise documented below in the visit note. 

## 2013-05-19 NOTE — Patient Instructions (Signed)
Buy OTC robitussin DM (generic) and take as directed on package. Buy OTC allegra D 24h and take one per day as needed.

## 2013-05-21 LAB — CULTURE, GROUP A STREP: Organism ID, Bacteria: NORMAL

## 2013-05-24 ENCOUNTER — Telehealth: Payer: Self-pay | Admitting: *Deleted

## 2013-05-24 MED ORDER — AMOXICILLIN 875 MG PO TABS: 875.0000 mg | ORAL_TABLET | Freq: Two times a day (BID) | ORAL | Status: DC

## 2013-05-24 NOTE — Telephone Encounter (Signed)
Patient called and stated that she was seen at [facility] on 12/26 for facial pressure, nasal congestion, and cough. Patient states that she was told to take Robitussin DM and Allegra. Patients that she ha been taking this on a regular and is not feeling any better. Patient states that she believes that she needs an antibiotic. She states that she returns to work on Friday and needs to feel better. Please advise. SW

## 2013-05-24 NOTE — Telephone Encounter (Signed)
Med sent to pharmacy and patient notified 

## 2013-05-24 NOTE — Telephone Encounter (Signed)
Ok for Amoxicillin 875mg  bid x10 days, #20, no refills.  If no improvement after meds needs f/u appt

## 2013-05-26 ENCOUNTER — Encounter: Payer: BC Managed Care – PPO | Admitting: Gastroenterology

## 2013-05-29 ENCOUNTER — Other Ambulatory Visit: Payer: Self-pay | Admitting: Family Medicine

## 2013-05-29 NOTE — Telephone Encounter (Signed)
Med filled.  

## 2013-05-30 ENCOUNTER — Ambulatory Visit: Payer: BC Managed Care – PPO | Admitting: Family Medicine

## 2013-06-07 ENCOUNTER — Ambulatory Visit: Payer: BC Managed Care – PPO | Admitting: Internal Medicine

## 2013-06-07 ENCOUNTER — Telehealth: Payer: Self-pay | Admitting: *Deleted

## 2013-06-07 NOTE — Telephone Encounter (Signed)
Requested pt call for new appt for Hep C evaluation.

## 2013-06-21 ENCOUNTER — Telehealth: Payer: Self-pay | Admitting: Gastroenterology

## 2013-06-21 ENCOUNTER — Encounter: Payer: BC Managed Care – PPO | Admitting: Gastroenterology

## 2013-06-21 NOTE — Telephone Encounter (Signed)
I think she needs to try anti-emetics, different prep first. Admission to hospital just for prepping is quite unusual for people who are completely ambulatory, well otherwise.  Not sure that her insurance co would pay for that admission without at least trying usual OP ideas first.    I'm happy to discuss this in office if she needs. Otherwise, see before for prep.

## 2013-06-21 NOTE — Telephone Encounter (Signed)
Left message on machine to call back  

## 2013-06-21 NOTE — Telephone Encounter (Signed)
Dr Christella HartiganJacobs see below, do you want to admit for prep?

## 2013-06-21 NOTE — Telephone Encounter (Signed)
OK, please let me know.  If she will reschedule, let's try her on miralax prep (split dose).  Also, will give her script for zofran 4mg , two pills, take one pill about 2 hours prior to starting the prep in evening and may repeat one more if needed.

## 2013-06-21 NOTE — Telephone Encounter (Signed)
Pt called and scheduled previsit and colon note left for previsit to follow new prep instructions and zofran order

## 2013-06-21 NOTE — Telephone Encounter (Signed)
I tried to reschedule her Colonoscopy when she walked into our office now. But she insists she will need to be admitted. That there is no possible way she can drink any prep or any solutions at all. She is off from work next week on 06-28-13 & 06-29-13 and wonders if she can have at that time in the hospital?

## 2013-06-26 ENCOUNTER — Other Ambulatory Visit: Payer: Self-pay | Admitting: Family Medicine

## 2013-06-26 NOTE — Telephone Encounter (Signed)
Last OV 04-27-13 Do not show when last OV was filled.

## 2013-06-26 NOTE — Telephone Encounter (Signed)
Med filled and faxed.  

## 2013-06-30 ENCOUNTER — Ambulatory Visit: Payer: BC Managed Care – PPO | Admitting: Family Medicine

## 2013-07-05 ENCOUNTER — Ambulatory Visit (AMBULATORY_SURGERY_CENTER): Payer: BC Managed Care – PPO

## 2013-07-05 VITALS — Ht 61.0 in | Wt 170.0 lb

## 2013-07-05 DIAGNOSIS — K5900 Constipation, unspecified: Secondary | ICD-10-CM

## 2013-07-05 MED ORDER — ONDANSETRON HCL 4 MG PO TABS: 4.0000 mg | ORAL_TABLET | Freq: Three times a day (TID) | ORAL | Status: DC | PRN

## 2013-07-05 MED ORDER — METOCLOPRAMIDE HCL 10 MG PO TABS: 10.0000 mg | ORAL_TABLET | Freq: Four times a day (QID) | ORAL | Status: DC

## 2013-07-11 ENCOUNTER — Encounter: Payer: BC Managed Care – PPO | Admitting: Gastroenterology

## 2013-08-02 ENCOUNTER — Encounter: Payer: Self-pay | Admitting: Family Medicine

## 2013-08-07 ENCOUNTER — Encounter: Payer: Self-pay | Admitting: Family Medicine

## 2013-08-07 ENCOUNTER — Ambulatory Visit (INDEPENDENT_AMBULATORY_CARE_PROVIDER_SITE_OTHER): Payer: BC Managed Care – PPO | Admitting: Family Medicine

## 2013-08-07 VITALS — BP 118/80 | HR 100 | Temp 98.3°F | Resp 16 | Wt 181.1 lb

## 2013-08-07 DIAGNOSIS — M25569 Pain in unspecified knee: Secondary | ICD-10-CM

## 2013-08-07 DIAGNOSIS — M25561 Pain in right knee: Secondary | ICD-10-CM

## 2013-08-07 DIAGNOSIS — J410 Simple chronic bronchitis: Secondary | ICD-10-CM | POA: Insufficient documentation

## 2013-08-07 MED ORDER — BENAZEPRIL-HYDROCHLOROTHIAZIDE 10-12.5 MG PO TABS: ORAL_TABLET | ORAL | Status: DC

## 2013-08-07 MED ORDER — VENLAFAXINE HCL ER 150 MG PO CP24: 150.0000 mg | ORAL_CAPSULE | Freq: Every day | ORAL | Status: DC

## 2013-08-07 NOTE — Assessment & Plan Note (Signed)
New.  Pt advised to stop smoking to improve both cough and lung function.  Pt to start OTC mucinex DM prn.

## 2013-08-07 NOTE — Progress Notes (Signed)
Pre visit review using our clinic review tool, if applicable. No additional management support is needed unless otherwise documented below in the visit note. 

## 2013-08-07 NOTE — Assessment & Plan Note (Signed)
R knee swollen and w/ deformity typically associated w/ degenerative changes.  Due to pain w/ flexion, concern for possible degenerative meniscal tear.  Refer to ortho for complete evaluation and tx.

## 2013-08-07 NOTE — Progress Notes (Signed)
   Subjective:    Patient ID: Vanessa [name], female    DOB: 06/21/1960, 53 y.o.   MRN: [id number]  HPI R knee pain and swelling- sxs started 'a couple of months or weeks or so'.  'i think it's arthritis'.  Swelling will improve if pt is off feet for 3-4 days.  Painful.  Particularly difficult to get up and about after prolonged sitting or sleeping.  No known injury.  Cough- persistent, 'all the time i'm spitting up mucous'.  Typically white.  No fevers.  Current smoker.   Review of Systems For ROS see HPI     Objective:   Physical Exam  Vitals reviewed. Constitutional: She is oriented to person, place, and time. She appears well-developed and well-nourished.  Cardiovascular: Normal rate, regular rhythm, normal heart sounds and intact distal pulses.   Pulmonary/Chest: Effort normal and breath sounds normal. No respiratory distress. She has no wheezes. She has no rales.  Intermittent hacking cough heard  Musculoskeletal: She exhibits edema (of R knee both superior-lateral to patella and inferior medial to patella) and tenderness (over R knee).  Decreased flexion of R knee w/ pain and crepitus  Neurological: She is alert and oriented to person, place, and time. No cranial nerve deficit. Coordination normal.  Skin: Skin is warm and dry. No erythema.          Assessment & Plan:

## 2013-08-07 NOTE — Patient Instructions (Signed)
We'll notify you of your ortho appt Please ICE and elevate your knee as much as possible Start Mucinex DM for cough and chest congestion Try and stop smoking Call GI and get instructions on the Miralax Call with any questions or concerns Hang in there!!!

## 2013-08-08 ENCOUNTER — Telehealth: Payer: Self-pay | Admitting: Family Medicine

## 2013-08-08 NOTE — Telephone Encounter (Signed)
Relevant patient education mailed to patient.  

## 2013-08-17 ENCOUNTER — Telehealth: Payer: Self-pay | Admitting: Gastroenterology

## 2013-08-17 NOTE — Telephone Encounter (Signed)
Talked with pt: she wanted to know which dose of Miralax to buy to mix prep.  Instructed pt to buy a 238 GM bottle of Miralax.

## 2013-08-23 ENCOUNTER — Ambulatory Visit: Payer: BC Managed Care – PPO | Admitting: Family Medicine

## 2013-09-13 ENCOUNTER — Other Ambulatory Visit: Payer: Self-pay | Admitting: *Deleted

## 2013-09-18 ENCOUNTER — Telehealth: Payer: Self-pay | Admitting: Gastroenterology

## 2013-09-18 NOTE — Telephone Encounter (Signed)
Called patient back. No answer. Will try again later.

## 2013-09-18 NOTE — Telephone Encounter (Signed)
Pt reached at work.  Prep reviewed.  Pt voiced understanding.

## 2013-09-19 ENCOUNTER — Telehealth: Payer: Self-pay | Admitting: Family Medicine

## 2013-09-19 DIAGNOSIS — S83209A Unspecified tear of unspecified meniscus, current injury, unspecified knee, initial encounter: Secondary | ICD-10-CM

## 2013-09-19 NOTE — Telephone Encounter (Signed)
Order for ortho sx placed per last office visit note. Left detailed message on pts voice mail to make aware

## 2013-09-19 NOTE — Telephone Encounter (Signed)
Caller name:Rhys Armacost Relation to pt: patient Call back number:[phone] Pharmacy:  Reason for call: Patient called and stated that [facility]. There is no referral for ortho in the system for her please advise.

## 2013-09-20 ENCOUNTER — Other Ambulatory Visit: Payer: Self-pay | Admitting: Family Medicine

## 2013-09-20 ENCOUNTER — Telehealth: Payer: Self-pay | Admitting: *Deleted

## 2013-09-20 ENCOUNTER — Encounter: Payer: BC Managed Care – PPO | Admitting: Gastroenterology

## 2013-09-20 NOTE — Progress Notes (Signed)
Spoke with pt, who states she tried to call the on call MD.  She did the Miralax split prep as directed and has had no results.  I reviewed with her the instructions, which she states that she did exactly.  She does have constipation- only "1 or 2 BMs a week"

## 2013-09-20 NOTE — Telephone Encounter (Signed)
Pt will need 2 day prep with Moviprep for her next colonoscopy

## 2013-09-20 NOTE — Telephone Encounter (Signed)
Med filled and faxed.  

## 2013-09-20 NOTE — Progress Notes (Signed)
Spoke with Dr. Christella HartiganJacobs and procedure cancelled for today.  She is to be on a 2 day prep with moviprep the next time.  I spoke with pt who states she will call to set up a PV and procedure when she gets her work schedule

## 2013-09-20 NOTE — Telephone Encounter (Signed)
Last OV 08-07-13 Med filled 06-26-13 #30 with 2

## 2013-09-22 ENCOUNTER — Telehealth: Payer: Self-pay | Admitting: Gastroenterology

## 2013-09-22 NOTE — Telephone Encounter (Signed)
Pt has tried to do a movi prep and miralax prep with no results, she was told to reschedule procedure but is very concerned about prepping.  She wants to discuss being admitted for prepping.  She was put on [facility] sch for 09/25/13

## 2013-09-25 ENCOUNTER — Ambulatory Visit (INDEPENDENT_AMBULATORY_CARE_PROVIDER_SITE_OTHER): Payer: BC Managed Care – PPO | Admitting: Physician Assistant

## 2013-09-25 ENCOUNTER — Encounter: Payer: Self-pay | Admitting: Physician Assistant

## 2013-09-25 ENCOUNTER — Ambulatory Visit (INDEPENDENT_AMBULATORY_CARE_PROVIDER_SITE_OTHER)
Admission: RE | Admit: 2013-09-25 | Discharge: 2013-09-25 | Disposition: A | Discharge status: Home / Self Care | Payer: BC Managed Care – PPO | Source: Ambulatory Visit | Attending: Physician Assistant | Admitting: Physician Assistant

## 2013-09-25 VITALS — BP 112/70 | HR 80 | Ht 61.0 in | Wt 177.8 lb

## 2013-09-25 DIAGNOSIS — K5900 Constipation, unspecified: Secondary | ICD-10-CM

## 2013-09-25 DIAGNOSIS — Z800 Family history of malignant neoplasm of digestive organs: Secondary | ICD-10-CM

## 2013-09-25 DIAGNOSIS — K5909 Other constipation: Secondary | ICD-10-CM

## 2013-09-25 MED ORDER — LUBIPROSTONE 24 MCG PO CAPS: 24.0000 ug | ORAL_CAPSULE | Freq: Two times a day (BID) | ORAL | Status: DC

## 2013-09-25 MED ORDER — MOVIPREP 100 G PO SOLR: 1.0000 | ORAL | Status: DC

## 2013-09-25 NOTE — Patient Instructions (Addendum)
You have been given a separate informational sheet regarding your tobacco use, the importance of quitting and local resources to help you quit. We sent a prescription to [facility] [address] For the Amitiza 24 mcg , take 1 tab twice daily. Samples provided. Take 1 dose of Miralax 17 grams in 8 0a of juice or water daily at bedtime.  Please go to the basement level to our radiology department for an abdominal x-ray. We will call you with the results.

## 2013-09-25 NOTE — Progress Notes (Signed)
Subjective:    Patient ID: Vanessa [name], female    DOB: 12-29-1960, 53 y.o.   MRN: [id number]  HPI  Vanessa [name] is a 53 year old female known to Dr. Christella HartiganJacobs. She has history of hypertension, hepatitis C prior history of heroin abuse for which she is on Suboxone chronically over the past 5 years. She has family history of colon cancer with a brother diagnosed in his early 8650s.  Patient had come in for colon neoplasia surveillance and also with complaints of chronic constipation. She has been scheduled for colonoscopy a couple of times over the past 4 or 5 months but due to chronic constipation has had difficulty with the prep since says she has not had much if any result. Her last procedure was scheduled last week, she took the MiraLax prep incisional he passed a small amount of liquid and therefore the procedure was canceled. She complains of ongoing problems with abdominal bloating and discomfort and significant constipation. She says she usually just has a bowel movement once a week and may only pass a small amount of stool. She says she's been having problems over the past several months with worsening constipation intermittent small amounts of rectal bleeding and says that she will have seepage of loose to liquid stools very frequently. She is not on any chronic laxative regimen. She denies any current rectal pain.    Review of Systems  Constitutional: Negative.   HENT: Negative.   Eyes: Negative.   Respiratory: Negative.   Cardiovascular: Negative.   Gastrointestinal: Positive for abdominal pain, constipation and abdominal distention.  Endocrine: Negative.   Genitourinary: Negative.   Musculoskeletal: Negative.   Skin: Negative.   Allergic/Immunologic: Negative.   Neurological: Negative.   Hematological: Negative.   Psychiatric/Behavioral: Negative.    Outpatient Prescriptions Prior to Visit  Medication Sig Dispense Refill  . ALPRAZolam (XANAX) 0.5 MG tablet TAKE 1 TABLET BY MOUTH  DAILY AS NEEDED  30 tablet  2  . benazepril-hydrochlorthiazide (LOTENSIN HCT) 10-12.5 MG per tablet TAKE 1 TABLET EVERY DAY  90 tablet  1  . fluticasone (FLONASE) 50 MCG/ACT nasal spray As directed      . ibuprofen (ADVIL,MOTRIN) 800 MG tablet Take 1 tablet by mouth 2 (two) times daily.      . SUBOXONE 8-2 MG FILM Place 0.5 strips under the tongue 2 (two) times daily.      Marland Kitchen. venlafaxine XR (EFFEXOR-XR) 150 MG 24 hr capsule Take 1 capsule (150 mg total) by mouth daily with breakfast.  90 capsule  1  . metoCLOPramide (REGLAN) 10 MG tablet Take 1 tablet (10 mg total) by mouth 4 (four) times daily.  2 tablet  0   No facility-administered medications prior to visit.   No Known Allergies Patient Active Problem List   Diagnosis Date Noted  . Knee pain, right 08/07/2013  . Smokers' cough 08/07/2013  . Viral URI 05/19/2013  . Chronic hepatitis C 04/27/2013  . Substance abuse or dependence 04/27/2013  . Bleeding hemorrhoids 04/27/2013  . Fecal soiling 04/27/2013  . Hypertension   . Depression    History  Substance Use Topics  . Smoking status: Current Every Day Smoker  . Smokeless tobacco: Never Used  . Alcohol Use: No   family history includes Colon cancer in her brother; Diabetes in her mother and sister; Drug abuse in her brother; Esophageal cancer in her brother; Hypertension in her mother and sister.     Objective:   Physical Exam   white female in no  acute distress blood pressure 112/70 pulse 80 height 5 foot 1 weight 177. HEENT ;nontraumatic normocephalic EOMI PERRLA sclera anicteric, Supple ;no JVD, Cardiovascular; regular rate and rhythm with S1-S2 no murmur or gallop, Pulmonary; clear bilaterally, Abdomen ;protuberant but soft no focal tenderness no guarding or rebound no palpable mass or hepatosplenomegaly bowel sounds are present, Rectal ;exam no significant stool in the rectal vault no impaction stool is brown and Hemoccult negative is, Extremities; no clubbing cyanosis or edema  skin warm and dry, Psych; mood and affect appropriate        Assessment & Plan:  #601  53 year old female with chronic constipation and complaints of abdominal bloating discomfort and intermittent fecal soilage which may represent overflow. She does not have an impaction on exam today. I suspect that her constipation is exacerbated by Suboxone use. Patient had an unsuccessful bowel prep last week and colonoscopy was canceled  #2 history of heroin abuse on chronic Suboxone #3 hepatitis C #4 hypertension #5 family history of colon cancer in patient's brother  Plan; start on the cheese a high dose at 24 mcg twice daily samples and prescription given today Also add MiraLax 17 g in 8 ounces of water every day KUB today to assess degree of obstipation Will reschedule for colonoscopy with Dr. Christella HartiganJacobs in several weeks and try to get her on a good bowel regimen between now and then so  the prep will be more effective, and will need a 2 day prep prior to the colonoscopy. Will discuss Suboxone use with Dr. Rubye OaksJacobs/anesthesia regarding any difficulty with propofol sedation

## 2013-09-25 NOTE — Progress Notes (Signed)
i agree with the above note. OK to proceed with colonoscopy (no interactions between propofol and naloxone)

## 2013-10-04 ENCOUNTER — Encounter: Payer: Self-pay | Admitting: Gastroenterology

## 2013-10-20 ENCOUNTER — Ambulatory Visit (AMBULATORY_SURGERY_CENTER): Payer: BC Managed Care – PPO | Admitting: Gastroenterology

## 2013-10-20 ENCOUNTER — Encounter: Payer: Self-pay | Admitting: Gastroenterology

## 2013-10-20 VITALS — BP 120/85 | HR 74 | Temp 97.0°F | Resp 35 | Ht 61.0 in | Wt 177.0 lb

## 2013-10-20 DIAGNOSIS — Z800 Family history of malignant neoplasm of digestive organs: Secondary | ICD-10-CM

## 2013-10-20 DIAGNOSIS — K5900 Constipation, unspecified: Secondary | ICD-10-CM

## 2013-10-20 DIAGNOSIS — R933 Abnormal findings on diagnostic imaging of other parts of digestive tract: Secondary | ICD-10-CM

## 2013-10-20 DIAGNOSIS — K5730 Diverticulosis of large intestine without perforation or abscess without bleeding: Secondary | ICD-10-CM

## 2013-10-20 DIAGNOSIS — K5909 Other constipation: Secondary | ICD-10-CM

## 2013-10-20 DIAGNOSIS — K6389 Other specified diseases of intestine: Secondary | ICD-10-CM

## 2013-10-20 MED ORDER — SODIUM CHLORIDE 0.9 % IV SOLN: 500.0000 mL | INTRAVENOUS | Status: DC

## 2013-10-20 NOTE — Progress Notes (Signed)
Called to room to assist during endoscopic procedure.  Patient ID and intended procedure confirmed with present staff. Received instructions for my participation in the procedure from the performing physician.  

## 2013-10-20 NOTE — Patient Instructions (Addendum)
One of your biggest health concerns is your smoking.  This increases your risk for most cancers and serious cardiovascular diseases such as strokes, heart attacks.  You should try your best to stop.  If you need assistance, please contact your PCP or Smoking Cessation Class at [place] ([phone]) or [place] Quit-Line (1-800-QUIT-NOW).  YOU HAD AN ENDOSCOPIC PROCEDURE TODAY AT THE [facility]: Refer to the procedure report that was given to you for any specific questions about what was found during the examination.  If the procedure report does not answer your questions, please call your gastroenterologist to clarify.  If you requested that your care partner not be given the details of your procedure findings, then the procedure report has been included in a sealed envelope for you to review at your convenience later.  YOU SHOULD EXPECT: Some feelings of bloating in the abdomen. Passage of more gas than usual.  Walking can help get rid of the air that was put into your GI tract during the procedure and reduce the bloating. If you had a lower endoscopy (such as a colonoscopy or flexible sigmoidoscopy) you may notice spotting of blood in your stool or on the toilet paper. If you underwent a bowel prep for your procedure, then you may not have a normal bowel movement for a few days.  DIET: Your first meal following the procedure should be a light meal and then it is ok to progress to your normal diet.  A half-sandwich or bowl of soup is an example of a good first meal.  Heavy or fried foods are harder to digest and may make you feel nauseous or bloated.  Likewise meals heavy in dairy and vegetables can cause extra gas to form and this can also increase the bloating.  Drink plenty of fluids but you should avoid alcoholic beverages for 24 hours.  ACTIVITY: Your care partner should take you home directly after the procedure.  You should plan to take it easy, moving slowly for the rest of  the day.  You can resume normal activity the day after the procedure however you should NOT DRIVE or use heavy machinery for 24 hours (because of the sedation medicines used during the test).    SYMPTOMS TO REPORT IMMEDIATELY: A gastroenterologist can be reached at any hour.  During normal business hours, 8:30 AM to 5:00 PM Monday through Friday, call [phone].  After hours and on weekends, please call the GI answering service at [phone] who will take a message and have the physician on call contact you.   Following lower endoscopy (colonoscopy or flexible sigmoidoscopy):  Excessive amounts of blood in the stool  Significant tenderness or worsening of abdominal pains  Swelling of the abdomen that is new, acute  Fever of 100F or higher  FOLLOW UP: If any biopsies were taken you will be contacted by phone or by letter within the next 1-3 weeks.  Call your gastroenterologist if you have not heard about the biopsies in 3 weeks.  Our staff will call the home number listed on your records the next business day following your procedure to check on you and address any questions or concerns that you may have at that time regarding the information given to you following your procedure. This is a courtesy call and so if there is no answer at the home number and we have not heard from you through the emergency physician on call, we will assume that you have returned to   your regular daily activities without incident.  SIGNATURES/CONFIDENTIALITY: You and/or your care partner have signed paperwork which will be entered into your electronic medical record.  These signatures attest to the fact that that the information above on your After Visit Summary has been reviewed and is understood.  Full responsibility of the confidentiality of this discharge information lies with you and/or your care-partner. 

## 2013-10-20 NOTE — Op Note (Signed)
[facility] [address] [place], [address]   COLONOSCOPY PROCEDURE REPORT  PATIENT: Vanessa [name], Vanessa [name]  MR#: [id number] BIRTHDATE: Apr 15, 1961 , 53  yrs. old GENDER: Female ENDOSCOPIST: Rachael Fee, MD REFERRED JO:[id number] Assunta Found, M.D. PROCEDURE DATE:  10/20/2013 PROCEDURE:   Colonoscopy with biopsy First Screening Colonoscopy - Avg.  risk and is 50 yrs.  old or older - No.  Prior Negative Screening - Now for repeat screening. N/A  History of Adenoma - Now for follow-up colonoscopy & has been > or = to 3 yrs.  N/A  Polyps Removed Today? No.  Recommend repeat exam, <10 yrs? Yes.  High risk (family or personal hx). ASA CLASS:   Class III INDICATIONS:fecal incontinence, brother had colon cancer MEDICATIONS: MAC sedation, administered by CRNA and propofol (Diprivan) 250mg  IV  DESCRIPTION OF PROCEDURE:   After the risks benefits and alternatives of the procedure were thoroughly explained, informed consent was obtained.  A digital rectal exam revealed no abnormalities of the rectum.   The LB [id number]  endoscope was introduced through the anus and advanced to the terminal ileum which was intubated for a short distance. No adverse events experienced.   The quality of the prep was adequate.  The instrument was then slowly withdrawn as the colon was fully examined.  COLON FINDINGS: The terminal ileum was normal.  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  Biopseis were taken from the erythematous mucosa, sent to pathology.  The examination was otherwise normal. Retroflexed views revealed no abnormalities. The time to cecum=4 minutes 10 seconds.  Withdrawal time=5 minutes 35 seconds.  The scope was withdrawn and the procedure completed. COMPLICATIONS: There were no complications.  ENDOSCOPIC IMPRESSION: The terminal ileum was normal.  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  Biopseis were taken from the erythematous mucosa, sent to pathology.  The examination was otherwise normal.  RECOMMENDATIONS: 1.  Await final pathology. 2.  Given your significant family history of colon cancer, you should have a repeat colonoscopy in 5 years   eSigned:  Rachael Fee, MD 10/20/2013 2:52 PM

## 2013-10-20 NOTE — Progress Notes (Signed)
Procedure ends, to recovery, report given and VSS. 

## 2013-10-23 ENCOUNTER — Telehealth: Payer: Self-pay | Admitting: *Deleted

## 2013-10-23 NOTE — Telephone Encounter (Signed)
  Follow up Call-  Call back number 10/20/2013  Post procedure Call Back phone  # [phone]  Permission to leave phone message Yes     Patient questions:  Do you have a fever, pain , or abdominal swelling? no Pain Score  0 *  Have you tolerated food without any problems? yes  Have you been able to return to your normal activities? yes  Do you have any questions about your discharge instructions: Diet   no Medications  no Follow up visit  no  Do you have questions or concerns about your Care? no  Actions: * If pain score is 4 or above: No action needed, pain <4.

## 2013-10-25 ENCOUNTER — Encounter: Payer: Self-pay | Admitting: Gastroenterology

## 2013-11-25 ENCOUNTER — Other Ambulatory Visit: Payer: Self-pay | Admitting: Family Medicine

## 2013-11-27 NOTE — Telephone Encounter (Signed)
Rx sent to the pharmacy by e-script.//AB/CMA 

## 2013-11-29 ENCOUNTER — Ambulatory Visit (INDEPENDENT_AMBULATORY_CARE_PROVIDER_SITE_OTHER): Payer: BC Managed Care – PPO | Admitting: Internal Medicine

## 2013-11-29 ENCOUNTER — Telehealth: Payer: Self-pay | Admitting: Family Medicine

## 2013-11-29 ENCOUNTER — Encounter: Payer: Self-pay | Admitting: Internal Medicine

## 2013-11-29 VITALS — BP 115/76 | HR 83 | Temp 97.8°F | Wt 177.0 lb

## 2013-11-29 DIAGNOSIS — N390 Urinary tract infection, site not specified: Secondary | ICD-10-CM

## 2013-11-29 DIAGNOSIS — J069 Acute upper respiratory infection, unspecified: Secondary | ICD-10-CM

## 2013-11-29 LAB — POCT URINALYSIS DIPSTICK
Bilirubin, UA: NEGATIVE
Blood, UA: NEGATIVE
GLUCOSE UA: NEGATIVE
Ketones, UA: NEGATIVE
NITRITE UA: POSITIVE
PH UA: 5
Spec Grav, UA: 1.02
UROBILINOGEN UA: 0.2

## 2013-11-29 MED ORDER — CIPROFLOXACIN HCL 500 MG PO TABS: 500.0000 mg | ORAL_TABLET | Freq: Two times a day (BID) | ORAL | Status: DC

## 2013-11-29 NOTE — Progress Notes (Signed)
Subjective:    Patient ID: Jana HakimSusie Vickers, female    DOB: [birth year], 53 y.o.   MRN: [id number]  DOS:  11/29/2013 Type of visit - description: acute, here for urinary sx  History: Symptoms started a week ago with dysuria. Also reports postnasal dripping and sinus pain for the last few days. Not taking any specific medication for her symptoms.   ROS Denies fever, chills no sore throat. Denies nausea, vomiting, abdominal or flank pain. Appetite normal Mild cough with small amount of white sputum.  Denies any suicidal ideas No hematuria, difficulty urinating. +Urinary frequency.   Past Medical History  Diagnosis Date  . Hypertension   . Depression   . Hepatitis C   . Frequent headaches   . History of heroin abuse     Past Surgical History  Procedure Laterality Date  . Ectopic pregnancy surgery    . Carpal tunnel release Left     History   Social History  . Marital Status: Single    Spouse Name: N/A    Number of Children: 1  . Years of Education: N/A   Occupational History  . Dialysis Tech    Social History Main Topics  . Smoking status: Current Every Day Smoker -- 0.50 packs/day  . Smokeless tobacco: Never Used  . Alcohol Use: No  . Drug Use: No  . Sexual Activity: Not on file   Other Topics Concern  . Not on file   Social History Narrative  . No narrative on file        Medication List       This list is accurate as of: 11/29/13  6:06 PM.  Always use your most recent med list.               ALPRAZolam 0.5 MG tablet  Commonly known as:  XANAX  TAKE 1 TABLET BY MOUTH DAILY AS NEEDED     benazepril-hydrochlorthiazide 10-12.5 MG per tablet  Commonly known as:  LOTENSIN HCT  TAKE 1 TABLET EVERY DAY     ciprofloxacin 500 MG tablet  Commonly known as:  CIPRO  Take 1 tablet (500 mg total) by mouth 2 (two) times daily.     fluticasone 50 MCG/ACT nasal spray  Commonly known as:  FLONASE  As directed     ibuprofen 800 MG tablet  Commonly known  as:  ADVIL,MOTRIN  Take 1 tablet by mouth 2 (two) times daily.     SUBOXONE 8-2 MG Film  Generic drug:  Buprenorphine HCl-Naloxone HCl  Place 0.5 strips under the tongue 2 (two) times daily.     venlafaxine XR 150 MG 24 hr capsule  Commonly known as:  EFFEXOR-XR  Take 1 capsule (150 mg total) by mouth daily with breakfast.           Objective:   Physical Exam BP 115/76  Pulse 83  Temp(Src) 97.8 F (36.6 C)  Wt 177 lb (80.287 kg)  SpO2 95% General -- alert, well-developed, NAD.    HEENT-- Not pale. TMs: R  Normal, L obscured by wax ; throat symmetric, no redness or discharge. Face symmetric, sinuses not tender to palpation. Nose slt  congested. Lungs -- normal respiratory effort, no intercostal retractions, no accessory muscle use, and normal breath sounds.  Heart-- normal rate, regular rhythm, no murmur.  Abdomen-- Not distended, good bowel sounds,soft,  slt tender lower abd w/o  rebound or rigidity.  No CVA tenderness   Extremities-- no pretibial edema bilaterally  Neurologic--  alert & oriented X3. Speech normal, gait appropriate for age, strength symmetric and appropriate for age.  Psych-- Cognition and judgment appear intact. Cooperative with normal attention span and concentration. No anxious or depressed appearing.        Assessment & Plan:   UTI, Sx and udip c/w UTI, Will send a urine culture and start empiric Cipro  URI, Recommend conservative treatment, see Instructions  The patient also likes to increase her benzodiazepines and Effexor doses, she has a history of polysubstance abuse, denies suicidality. Recommend to schedule a visit with PCP to discuss those issues.

## 2013-11-29 NOTE — Progress Notes (Signed)
Pre visit review using our clinic review tool, if applicable. No additional management support is needed unless otherwise documented below in the visit note. 

## 2013-11-29 NOTE — Telephone Encounter (Signed)
Since i haven't seen pt since March, I would like to see her prior to adjusting meds.

## 2013-11-29 NOTE — Telephone Encounter (Signed)
Pt came in today for an acute visit for UTI and saw Dr. Drue NovelPaz.  At check out, pt mentioned she is also having issues with her meds.  She thinks the dosages need to be increased for her Alprazolam and Venlafaxine.  Panic attacks during the day and waking at night with hot flashes.  Please advise.

## 2013-11-29 NOTE — Telephone Encounter (Signed)
Please advise, Pt has not been seen by you since march. I know she needs a 15 minute mood follow up. Just not sure about changing meds and having her come back in 4-6 weeks?

## 2013-11-29 NOTE — Patient Instructions (Signed)
Drink plenty of fluids Take ciprofloxacin for 5 days    If  cough, take Mucinex DM twice a day as needed  If nasal congestion use OTC Nasocort: 2 nasal sprays on each side of the nose daily until you feel better Call if no better in few days   Call anytime if the symptoms are severe,   high fever, stomach pain, blood in the urine  See Dr Beverely Lowabori regards xanax and Effexor

## 2013-11-29 NOTE — Telephone Encounter (Signed)
Spoke with pt and informed on the need for appt. Pt scheduled for 7/15 due to her being off of work.

## 2013-12-02 LAB — URINE CULTURE

## 2013-12-06 ENCOUNTER — Encounter: Payer: Self-pay | Admitting: General Practice

## 2013-12-06 ENCOUNTER — Encounter: Payer: Self-pay | Admitting: Family Medicine

## 2013-12-06 ENCOUNTER — Ambulatory Visit (INDEPENDENT_AMBULATORY_CARE_PROVIDER_SITE_OTHER): Payer: BC Managed Care – PPO | Admitting: Family Medicine

## 2013-12-06 VITALS — BP 130/82 | HR 79 | Temp 98.0°F | Resp 16 | Wt 180.0 lb

## 2013-12-06 DIAGNOSIS — F329 Major depressive disorder, single episode, unspecified: Secondary | ICD-10-CM

## 2013-12-06 DIAGNOSIS — F3289 Other specified depressive episodes: Secondary | ICD-10-CM

## 2013-12-06 DIAGNOSIS — F32A Depression, unspecified: Secondary | ICD-10-CM

## 2013-12-06 MED ORDER — BUPROPION HCL ER (XL) 150 MG PO TB24: 150.0000 mg | ORAL_TABLET | Freq: Every day | ORAL | Status: DC

## 2013-12-06 NOTE — Patient Instructions (Signed)
Follow up in 4-6 weeks to recheck mood Continue the Effexor daily at 150mg  ADD the Wellbutrin 150mg  daily Use the alprazolam as needed for those panicked moments Call with any questions or concerns Hang in there!  You can do this!

## 2013-12-06 NOTE — Progress Notes (Signed)
Pre visit review using our clinic review tool, if applicable. No additional management support is needed unless otherwise documented below in the visit note. 

## 2013-12-06 NOTE — Assessment & Plan Note (Signed)
Deteriorated.  Pt able to contract for safety.  Will add Wellbutrin to current dose of Effexor as pt is hesitant to max out SNRI.  Will not increase dose or amount of xanax due to pt's previous hx of substance abuse.  Will follow closely.

## 2013-12-06 NOTE — Progress Notes (Signed)
   Subjective:    Patient ID: Vanessa [name], female    DOB: July 02, 1960, 53 y.o.   MRN: [id number]  HPI Depression/anxiety- pt reports increased anxiety recently, 'panic attacks'.  Also having hot flashes at night- menopausal x2 yrs.  Crying frequently, feeling overwhelmed.  Pt taking Effexor XR 150mg .  Taking Alprazolam 0.5mg  daily.  Pt feels Effexor was effective but has now plateau-d.    Hep C- pt has not been to Hep C clinic due to high copay.  Plans to go when financial situation has improved.   Review of Systems For ROS see HPI     Objective:   Physical Exam  Vitals reviewed. Constitutional: She is oriented to person, place, and time. She appears well-developed and well-nourished. No distress.  Neurological: She is alert and oriented to person, place, and time.  Skin: Skin is warm and dry.  Psychiatric: Her behavior is normal. Judgment and thought content normal.  Somewhat withdrawn, mildly flat affect          Assessment & Plan:

## 2013-12-10 ENCOUNTER — Other Ambulatory Visit: Payer: Self-pay | Admitting: Family Medicine

## 2013-12-11 NOTE — Telephone Encounter (Signed)
Med filled.  

## 2013-12-22 ENCOUNTER — Other Ambulatory Visit: Payer: Self-pay | Admitting: Family Medicine

## 2013-12-22 NOTE — Telephone Encounter (Signed)
Med filled.  

## 2014-01-05 ENCOUNTER — Emergency Department (HOSPITAL_BASED_OUTPATIENT_CLINIC_OR_DEPARTMENT_OTHER): Payer: BC Managed Care – PPO

## 2014-01-05 ENCOUNTER — Telehealth: Payer: Self-pay

## 2014-01-05 ENCOUNTER — Observation Stay (HOSPITAL_BASED_OUTPATIENT_CLINIC_OR_DEPARTMENT_OTHER)
Admission: EM | Admit: 2014-01-05 | Discharge: 2014-01-06 | Disposition: A | Discharge status: Home / Self Care | Payer: BC Managed Care – PPO | Attending: Internal Medicine | Admitting: Internal Medicine

## 2014-01-05 ENCOUNTER — Encounter (HOSPITAL_BASED_OUTPATIENT_CLINIC_OR_DEPARTMENT_OTHER): Payer: Self-pay | Admitting: Emergency Medicine

## 2014-01-05 ENCOUNTER — Ambulatory Visit (INDEPENDENT_AMBULATORY_CARE_PROVIDER_SITE_OTHER): Payer: BC Managed Care – PPO

## 2014-01-05 VITALS — BP 124/92 | HR 88 | Temp 98.0°F

## 2014-01-05 DIAGNOSIS — F17200 Nicotine dependence, unspecified, uncomplicated: Secondary | ICD-10-CM | POA: Insufficient documentation

## 2014-01-05 DIAGNOSIS — F3289 Other specified depressive episodes: Secondary | ICD-10-CM

## 2014-01-05 DIAGNOSIS — I159 Secondary hypertension, unspecified: Secondary | ICD-10-CM

## 2014-01-05 DIAGNOSIS — F329 Major depressive disorder, single episode, unspecified: Secondary | ICD-10-CM

## 2014-01-05 DIAGNOSIS — I10 Essential (primary) hypertension: Secondary | ICD-10-CM | POA: Diagnosis present

## 2014-01-05 DIAGNOSIS — R079 Chest pain, unspecified: Secondary | ICD-10-CM

## 2014-01-05 DIAGNOSIS — I158 Other secondary hypertension: Secondary | ICD-10-CM

## 2014-01-05 DIAGNOSIS — F32A Depression, unspecified: Secondary | ICD-10-CM | POA: Diagnosis present

## 2014-01-05 DIAGNOSIS — Z720 Tobacco use: Secondary | ICD-10-CM

## 2014-01-05 DIAGNOSIS — B1920 Unspecified viral hepatitis C without hepatic coma: Secondary | ICD-10-CM | POA: Insufficient documentation

## 2014-01-05 HISTORY — DX: Anxiety disorder, unspecified: F41.9

## 2014-01-05 LAB — CBC
HCT: 40.5 % (ref 36.0–46.0)
HEMATOCRIT: 39.6 % (ref 36.0–46.0)
Hemoglobin: 13.5 g/dL (ref 12.0–15.0)
Hemoglobin: 13 g/dL (ref 12.0–15.0)
MCH: 29.6 pg (ref 26.0–34.0)
MCH: 28.9 pg (ref 26.0–34.0)
MCHC: 33.3 g/dL (ref 30.0–36.0)
MCHC: 32.8 g/dL (ref 30.0–36.0)
MCV: 88.8 fL (ref 78.0–100.0)
MCV: 88 fL (ref 78.0–100.0)
PLATELETS: 248 10*3/uL (ref 150–400)
Platelets: 248 10*3/uL (ref 150–400)
RBC: 4.56 MIL/uL (ref 3.87–5.11)
RBC: 4.5 MIL/uL (ref 3.87–5.11)
RDW: 14.7 % (ref 11.5–15.5)
RDW: 14.5 % (ref 11.5–15.5)
WBC: 8.9 10*3/uL (ref 4.0–10.5)
WBC: 7.9 10*3/uL (ref 4.0–10.5)

## 2014-01-05 LAB — I-STAT CHEM 8, ED
BUN: 16 mg/dL (ref 6–23)
CHLORIDE: 98 meq/L (ref 96–112)
CREATININE: 0.7 mg/dL (ref 0.50–1.10)
Calcium, Ion: 1.24 mmol/L — ABNORMAL HIGH (ref 1.12–1.23)
GLUCOSE: 86 mg/dL (ref 70–99)
HCT: 43 % (ref 36.0–46.0)
Hemoglobin: 14.6 g/dL (ref 12.0–15.0)
POTASSIUM: 4.3 meq/L (ref 3.7–5.3)
Sodium: 141 mEq/L (ref 137–147)
TCO2: 27 mmol/L (ref 0–100)

## 2014-01-05 LAB — TROPONIN I
Troponin I: <0.3 ng/mL (ref <0.30)
Troponin I: <0.3 ng/mL (ref <0.30)

## 2014-01-05 LAB — TSH: TSH: 3.48 u[IU]/mL (ref 0.350–4.500)

## 2014-01-05 LAB — PHOSPHORUS: Phosphorus: 3.4 mg/dL (ref 2.3–4.6)

## 2014-01-05 LAB — CREATININE, SERUM
Creatinine, Ser: 0.66 mg/dL (ref 0.50–1.10)
GFR calc Af Amer: >90 mL/min (ref >90)
GFR calc non Af Amer: >90 mL/min (ref >90)

## 2014-01-05 LAB — MAGNESIUM: Magnesium: 1.9 mg/dL (ref 1.5–2.5)

## 2014-01-05 MED ORDER — ACETAMINOPHEN 325 MG PO TABS
650.0000 mg | ORAL_TABLET | ORAL | Status: DC | PRN
Start: 2014-01-05 — End: 2014-01-06
  Administered 2014-01-05: 650 mg via ORAL

## 2014-01-05 MED ORDER — VENLAFAXINE HCL ER 150 MG PO CP24
150.0000 mg | ORAL_CAPSULE | Freq: Every day | ORAL | Status: DC
  Administered 2014-01-06: 150 mg via ORAL
  Filled 2014-01-05 (×2): qty 1

## 2014-01-05 MED ORDER — NITROGLYCERIN 0.4 MG SL SUBL
SUBLINGUAL_TABLET | SUBLINGUAL | Status: AC
  Administered 2014-01-05: 0.4 mg via SUBLINGUAL
  Filled 2014-01-05: qty 3

## 2014-01-05 MED ORDER — BENAZEPRIL-HYDROCHLOROTHIAZIDE 10-12.5 MG PO TABS: 1.0000 | ORAL_TABLET | Freq: Every day | ORAL | Status: DC

## 2014-01-05 MED ORDER — FLUTICASONE PROPIONATE 50 MCG/ACT NA SUSP
1.0000 | Freq: Every day | NASAL | Status: DC
  Administered 2014-01-05 – 2014-01-06 (×2): 1 via NASAL
  Filled 2014-01-05: qty 16

## 2014-01-05 MED ORDER — MORPHINE SULFATE 2 MG/ML IJ SOLN: 2.0000 mg | INTRAMUSCULAR | Status: DC | PRN

## 2014-01-05 MED ORDER — ENOXAPARIN SODIUM 40 MG/0.4ML ~~LOC~~ SOLN
40.0000 mg | SUBCUTANEOUS | Status: DC
  Administered 2014-01-05: 40 mg via SUBCUTANEOUS
  Filled 2014-01-05 (×2): qty 0.4

## 2014-01-05 MED ORDER — BENAZEPRIL HCL 10 MG PO TABS
10.0000 mg | ORAL_TABLET | Freq: Every day | ORAL | Status: DC
  Administered 2014-01-06: 10 mg via ORAL
  Filled 2014-01-05: qty 1

## 2014-01-05 MED ORDER — ONDANSETRON HCL 4 MG/2ML IJ SOLN: 4.0000 mg | Freq: Four times a day (QID) | INTRAMUSCULAR | Status: DC | PRN

## 2014-01-05 MED ORDER — BUPROPION HCL ER (XL) 150 MG PO TB24
150.0000 mg | ORAL_TABLET | Freq: Every day | ORAL | Status: DC
  Filled 2014-01-05 (×2): qty 1

## 2014-01-05 MED ORDER — ASPIRIN 81 MG PO CHEW
324.0000 mg | CHEWABLE_TABLET | Freq: Once | ORAL | Status: AC
  Administered 2014-01-05: 324 mg via ORAL

## 2014-01-05 MED ORDER — HYDROCHLOROTHIAZIDE 12.5 MG PO CAPS
12.5000 mg | ORAL_CAPSULE | Freq: Every day | ORAL | Status: DC
Start: 2014-01-06 — End: 2014-01-06
  Administered 2014-01-06: 12.5 mg via ORAL
  Filled 2014-01-05: qty 1

## 2014-01-05 MED ORDER — ALPRAZOLAM 0.5 MG PO TABS: 0.5000 mg | ORAL_TABLET | Freq: Every day | ORAL | Status: DC | PRN

## 2014-01-05 MED ORDER — ASPIRIN 81 MG PO CHEW
CHEWABLE_TABLET | ORAL | Status: AC
  Administered 2014-01-05: 324 mg via ORAL
  Filled 2014-01-05: qty 4

## 2014-01-05 MED ORDER — ACETAMINOPHEN 500 MG PO TABS
1000.0000 mg | ORAL_TABLET | Freq: Once | ORAL | Status: AC
  Administered 2014-01-05: 1000 mg via ORAL
  Filled 2014-01-05: qty 2

## 2014-01-05 MED ORDER — NITROGLYCERIN 0.4 MG SL SUBL
0.4000 mg | SUBLINGUAL_TABLET | SUBLINGUAL | Status: DC | PRN
  Administered 2014-01-05: 0.4 mg via SUBLINGUAL

## 2014-01-05 MED ORDER — METOPROLOL TARTRATE 12.5 MG HALF TABLET
12.5000 mg | ORAL_TABLET | Freq: Two times a day (BID) | ORAL | Status: DC
  Administered 2014-01-05: 12.5 mg via ORAL
  Filled 2014-01-05 (×3): qty 1

## 2014-01-05 NOTE — ED Provider Notes (Signed)
I saw and evaluated the patient, reviewed the resident's note and I agree with the findings and plan.   EKG Interpretation EKG independently reviewed by myself: Normal sinus rhythm with a rate of 78, no obvious ST elevation, T-wave inversions limited V1, otherwise normal EKG      Patient presents with chest pain. Onset approximately 6:30 this morning. She states that she is having sharp pain in her neck and upper chest that is nonradiating. She is also having left-sided chest pain. The chest pain is worse with exertion. Neck pain is worse with movement. Patient has a history of hypertension and early family history of heart disease. Was seen by her primary doctor and referred here for further management. She still complaining of 2/10 pain. Denies any pleuritic nature to the pain. Denies any shortness of breath or diaphoresis. On exam, she is well-appearing in no acute distress. Vital signs are reassuring. She does have reproducible neck tenderness with range of motion of the neck. However, chest wall tenderness is not reproducible. EKG is largely reassuring but does have isolated T wave inversions. I have also reviewed EKG from the patient's primary doctor's office. Initial troponin is negative and chest x-rays reassuring. Patient was given full dose aspirin. Given continued pain, patient was given nitroglycerin with improvement of her symptoms. While her presentation is not fully consistent with ACS, there are some features suggestive of ACS and patient does have risk factors. Heart score is 4. She also appears to have a musculoskeletal component of neck pain. Discuss with patient admission for observation and chest pain rule out. Patient is in agreement.  Shon Batonourtney F Ariahna Smiddy, MD 01/05/14 [phone]

## 2014-01-05 NOTE — Telephone Encounter (Signed)
Agree w/ advice given- see OV from today.

## 2014-01-05 NOTE — H&P (Signed)
[facility] History and Physical  Vanessa HakimSusie Touchette ZOX:[id number] DOB: [birth year] DOA: 01/05/2014  Referring physician:  PCP: Neena RhymesKatherine Tabori, MD  Specialists:   Chief Complaint: Chest Pain  HPI: Vanessa [name] is a 53 y.o. female  With a history of tobacco abuse, hypertension, depression and anxiety that presented to [facility] today for chest pain. Patient was at work chest pain started. She states is a left-sided chest, characterized as a 9/10, sharp stabbing pain which radiated to her neck and left arm. Patient states her pain was better after receiving a nitroglycerin at the hospital. Patient denied any shortness of breath, nausea, vomiting, dizziness, diaphoresis with this pain. She denies any history of having the same pain in the past. Patient does not take any aspirin at home. Patient denies any lifting pushing pulling at work recently. 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 At this time patient no longer complains of chest pain.  Review of Systems:  Constitutional: Denies fever, chills, diaphoresis, appetite change and fatigue.  HEENT: Denies photophobia, eye pain, redness, hearing loss, ear pain, congestion, sore throat, rhinorrhea, sneezing, mouth sores, trouble swallowing, neck pain, neck stiffness and tinnitus.   Respiratory: Denies SOB, DOE, cough, chest tightness,  and wheezing.   Cardiovascular: Had chest pain. See HPI Gastrointestinal: Denies nausea, vomiting, abdominal pain, diarrhea, constipation, blood in stool and abdominal distention.  Genitourinary: Denies dysuria, urgency, frequency, hematuria, flank pain and difficulty urinating.  Musculoskeletal: Denies myalgias, back pain, joint swelling, arthralgias and gait problem.  Skin: Denies pallor, rash and wound.  Neurological: Denies dizziness, seizures, syncope, weakness, light-headedness, numbness and  headaches.  Hematological: Denies adenopathy. Easy bruising, personal or family bleeding history  Psychiatric/Behavioral: Denies suicidal ideation, mood changes, confusion, nervousness, sleep disturbance and agitation  Past Medical History  Diagnosis Date  . Hypertension   . Depression   . Hepatitis C   . Frequent headaches   . History of heroin abuse   . Anxiety    Past Surgical History  Procedure Laterality Date  . Ectopic pregnancy surgery    . Carpal tunnel release Left    Social History:  reports that she has been smoking Cigarettes.  She has been smoking about 0.50 packs per day. She has never used smokeless tobacco. She reports that she does not drink alcohol or use illicit drugs.   No Known Allergies  Family History  Problem Relation Age of Onset  . Drug abuse Brother   . Esophageal cancer Brother   . Hypertension Mother   . Diabetes Mother   . Colon cancer Brother   . Hypertension Sister     x's 2  . Diabetes Sister     oldest sister  . Stomach cancer Neg Hx     Prior to Admission medications   Medication Sig Start Date End Date Taking? Authorizing Provider  ALPRAZolam Prudy Feeler(XANAX) 0.5 MG tablet TAKE 1 TABLET BY MOUTH DAILY AS NEEDED 09/20/13  Yes Sheliah HatchKatherine E Tabori, MD  benazepril-hydrochlorthiazide (LOTENSIN HCT) 10-12.5 MG per tablet TAKE 1 TABLET EVERY DAY 12/22/13  Yes Sheliah HatchKatherine E Tabori, MD  ibuprofen (ADVIL,MOTRIN) 800 MG tablet Take 1 tablet by mouth 2 (two) times daily. 04/25/13  Yes Historical Provider, MD  SUBOXONE 8-2 MG FILM Place 0.5 strips under the tongue 2 (two) times daily. 04/18/13  Yes Historical Provider, MD  venlafaxine XR (EFFEXOR-XR) 150 MG 24 hr capsule TAKE 1  CAPSULE (150 MG TOTAL) BY MOUTH DAILY WITH BREAKFAST.   Yes Sheliah Hatch, MD  buPROPion (WELLBUTRIN XL) 150 MG 24 hr tablet Take 1 tablet (150 mg total) by mouth daily. 12/06/13   Sheliah Hatch, MD  fluticasone Aleda Grana) 50 MCG/ACT nasal spray As directed 01/24/13   Historical  Provider, MD   Physical Exam: Filed Vitals:   01/05/14 1721  BP: 125/81  Pulse: 71  Temp: 98.5 F (36.9 C)  Resp: 15     General: Well developed, well nourished, NAD, appears stated age  HEENT: NCAT, PERRLA, EOMI, Anicteic Sclera, mucous membranes moist.   Neck: Supple, no JVD, no masses  Cardiovascular: S1 S2 auscultated, no rubs, murmurs or gallops. Regular rate and rhythm.  Respiratory: Clear to auscultation bilaterally with equal chest rise  Abdomen: Soft, nontender, nondistended, + bowel sounds  Extremities: warm dry without cyanosis clubbing or edema  Neuro: AAOx3, cranial nerves grossly intact. Strength 5/5 in patient's upper and lower extremities bilaterally  Skin: Without rashes exudates or nodules  Psych: Normal affect and demeanor with intact judgement and insight  Labs on Admission:  Basic Metabolic Panel:  Recent Labs Lab 01/05/14 1136  NA 141  K 4.3  CL 98  GLUCOSE 86  BUN 16  CREATININE 0.70   Liver Function Tests: No results found for this basename: AST, ALT, ALKPHOS, BILITOT, PROT, ALBUMIN,  in the last 168 hours No results found for this basename: LIPASE, AMYLASE,  in the last 168 hours No results found for this basename: AMMONIA,  in the last 168 hours CBC:  Recent Labs Lab 01/05/14 1130 01/05/14 1136  WBC 8.9  --   HGB 13.5 14.6  HCT 40.5 43.0  MCV 88.8  --   PLT 248  --    Cardiac Enzymes:  Recent Labs Lab 01/05/14 1130  TROPONINI <0.30    BNP (last 3 results) No results found for this basename: PROBNP,  in the last 8760 hours CBG: No results found for this basename: GLUCAP,  in the last 168 hours  Radiological Exams on Admission: Dg Chest 2 View  01/05/2014   CLINICAL DATA:  LEFT side chest pain for 1 day, history hypertension, hepatitis-C  EXAM: CHEST  2 VIEW  COMPARISON:  04/29/2013  FINDINGS: Normal heart size, mediastinal contours, and pulmonary vascularity.  Lungs clear.  No pleural effusion or pneumothorax.   Bones unremarkable.  IMPRESSION: No acute abnormalities.   Electronically Signed   By: Ulyses Southward M.D.   On: 01/05/2014 12:46    EKG: Independently reviewed. Sinus rhythm, rate 78  Assessment/Plan  Acute Chest pain, rule out acute coronary syndrome -Patient [facility] -Patient will be admitted for observation on telemetry to monitor for any tachycardia or bradycardia arrhythmias -First troponin negative, will continue to cycle her troponins every 6 hours -Will place patient on aspirin, beta blocker, continue her ACE inhibitor -Will obtain lipid panel, TSH, magnesium, phosphate level -Patient does have risk factors for coronary artery disease including family history, hypertension, tobacco abuse -Will consult cardiology for further intervention and possible stress -Will obtain echocardiogram  Tobacco abuse -Smoking cessation counseling given  Hypertension -Controlled, continue Lotensin -Will add on metoprolol  Depression and anxiety -Continue Xanax, Effexor, Wellbutrin   DVT prophylaxis: Lovenox  Code Status: Full  Condition: Guarded  Family Communication: None at bedside. Admission, patients condition and plan of care including tests being ordered have been discussed with the patient, who indicates understanding  and agrees with the plan and Code Status.  Disposition Plan: Admitted for observation  Time spent: 45 minutes  Shazia Mitchener D.O. [facility] Pager [phone]  If 7PM-7AM, please contact night-coverage [email or web address] Password [email or web address] 01/05/2014, 5:43 PM

## 2014-01-05 NOTE — ED Notes (Signed)
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  Denies sob, nausea or sweating.  Was seen by her PCP and sent here for further evaluation.

## 2014-01-05 NOTE — Patient Instructions (Signed)
Go to ER at [facility].

## 2014-01-05 NOTE — Progress Notes (Signed)
Pt walked into the clinic today at 10:20 am with complaints of left sided chest pain, left sided neck pain, and Headache/sinus pressure. Chest pain started this morning. Rated 8/10. Described as sore, throbbing chest pain that worsens with normal respirations. Denied shortness of breath, diaphoresis, nausea/vomiting, jaw pain. Pt stated that she took Xanax today 0800 to see if it would relieve pain. No relief. Left sided neck pain radiated to the middle of patient's upper arm. Started this morning as well. Rated 8/10. Described as a sharp, shooting pain down the side of neck to left arm. Denies recent injury to neck or arm. Head pressure-frontal lobe. + nasal drainage.   VS: T-98, P- 88, BP- 124/92, 02-95% RA   Spoke with Dr. Beverely Lowabori regarding patient's complaints. Verbal order given for EKG.   EKG- Sinus rhythm. Old anterior infarct. Inverted T waves in V1.   [facility]. Offered to call EMS multiple times. Pt declined each time.  Pt stated that she would drive herself.  Charge nurse, Nelva Bushorma, was made aware that patient is soon to arrive.

## 2014-01-05 NOTE — ED Provider Notes (Signed)
CSN: [id number]     Arrival date & time 01/05/14  1118 History   First MD Initiated Contact with Patient 01/05/14 1134   History provided by patient.  Chief Complaint  Patient presents with  . Chest Pain   HPI  Patient reports sudden onset Left Chest pain around 0630 today, pain associated with Left neck pain radiating to top of left shoulder and some down left arm. Left chest pain radiates across under left breast and to left lateral chest wall. Describes pain is worse to the touch, sharp pressure, constant pain with some intermittent flares of "tightening twinges" in left neck radiating down into arm, worse with movements of lifting left arm and certain head movements. Admits frontal headache/pressure, generalized weakness (gradual onset). Denies any SOB, abdominal pain, nausea, vomiting, numbness, tingling. Denies history of similar pain before. 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 Additional recent history of significant exertion with left arm strain about 1 week ago while working with a patient >400 lbs, patient stated she hurt her left arm and elbow afterwards, but it has since felt better.  Significant PMH HTN, Depression/Anxiety. No prior cardiac history. Reports family member death from MI at age 85.  Past Medical History  Diagnosis Date  . Hypertension   . Depression   . Hepatitis C   . Frequent headaches   . History of heroin abuse   . Anxiety    Past Surgical History  Procedure Laterality Date  . Ectopic pregnancy surgery    . Carpal tunnel release Left    Family History  Problem Relation Age of Onset  . Drug abuse Brother   . Esophageal cancer Brother   . Hypertension Mother   . Diabetes Mother   . Colon cancer Brother   . Hypertension Sister     x's 2  . Diabetes Sister     oldest sister  .  Stomach cancer Neg Hx    History  Substance Use Topics  . Smoking status: Current Every Day Smoker -- 0.50 packs/day    Types: Cigarettes  . Smokeless tobacco: Never Used  . Alcohol Use: No   OB History   Grav Para Term Preterm Abortions TAB SAB Ect Mult Living                 Review of Systems  See above HPI  Allergies  Review of patient's allergies indicates no known allergies.  Home Medications   Prior to Admission medications   Medication Sig Start Date End Date Taking? Authorizing Provider  ALPRAZolam Prudy Feeler) 0.5 MG tablet TAKE 1 TABLET BY MOUTH DAILY AS NEEDED 09/20/13  Yes Sheliah Hatch, MD  benazepril-hydrochlorthiazide (LOTENSIN HCT) 10-12.5 MG per tablet TAKE 1 TABLET EVERY DAY 12/22/13  Yes Sheliah Hatch, MD  ibuprofen (ADVIL,MOTRIN) 800 MG tablet Take 1 tablet by mouth 2 (two) times daily. 04/25/13  Yes Historical Provider, MD  SUBOXONE 8-2 MG FILM Place 0.5 strips under the tongue 2 (two) times daily. 04/18/13  Yes Historical Provider, MD  venlafaxine XR (EFFEXOR-XR) 150 MG 24 hr capsule TAKE 1 CAPSULE (150 MG TOTAL) BY MOUTH DAILY WITH BREAKFAST.   Yes Sheliah Hatch, MD  buPROPion (WELLBUTRIN XL) 150 MG 24 hr tablet Take 1 tablet (150 mg total) by mouth daily. 12/06/13   Sheliah Hatch, MD  fluticasone (  FLONASE) 50 MCG/ACT nasal spray As directed 01/24/13   Historical Provider, MD   BP 117/76  Pulse 84  Temp(Src) 97.9 F (36.6 C) (Oral)  Resp 16  Ht 5\' 1"  (1.549 m)  Wt 170 lb (77.111 kg)  BMI 32.14 kg/m2  SpO2 99% Physical Exam  Gen - mild discomfort due to neck / chest pain, but cooperative and NAD HEENT - NCAT, PERRL, EOMI, oropharynx clear, MMM Neck - supple, no C-spine tenderness, +tenderness left lateral SCM and radiation to left posterior shoulder girdle, FROM with inc pain with Right rotation. Heart - RRR, no murmurs heard Chest Wall - +reproducible tenderness anterior left chest wall, lower left lateral chest wall under breast Lungs  - CTAB, no wheezing, crackles, or rhonchi. Normal work of breathing. MSK - Back non-tender to palpation Abd - soft, NTND, no masses, +active BS Ext - non-tender, no edema, peripheral pulses intact +2 b/l Skin - warm, dry, no rashes Neuro - awake, alert, oriented, grossly non-focal, intact muscle strength 5/5 b/l, intact distal sensation to light touch   ED Course  Procedures (including critical care time) Labs Review Labs Reviewed  I-STAT CHEM 8, ED - Abnormal; Notable for the following:    Calcium, Ion 1.24 (*)    All other components within normal limits  TROPONIN I  CBC    Imaging Review Dg Chest 2 View  01/05/2014   CLINICAL DATA:  LEFT side chest pain for 1 day, history hypertension, hepatitis-C  EXAM: CHEST  2 VIEW  COMPARISON:  04/29/2013  FINDINGS: Normal heart size, mediastinal contours, and pulmonary vascularity.  Lungs clear.  No pleural effusion or pneumothorax.  Bones unremarkable.  IMPRESSION: No acute abnormalities.   Electronically Signed   By: Ulyses SouthwardMark  Boles M.D.   On: 01/05/2014 12:46     EKG Interpretation None      MDM   Final diagnoses:  Chest pain, unspecified chest pain type   [age] yr F with PMH HTN, Depression/Anxiety, no prior cardiac hx, presents for acute onset Left chest and Left neck pain with radiations to lateral chest wall and left arm, associated with some non-focal generalized weakness. Denies chest tightness, SOB, nausea/vomiting, abdominal pain. Suspect MSK etiology with costochondritis vs muscle strain in neck with radiating pain, however significant concern for potential cardiac etiology of CP (some typical features, with exertion, some risk factors, with age 53, hx HTN, +family hx early cardiac death (age 52, MI).  Proceed with initial EKG (NSR, no acute ST changes, with mild T wave inversion V1 only, no evidence of ischemia), reviewed prior EKG from PCP copy provided. Ordered CXR, CHEM-I Stat lab, Troponin-I. Patient given full dose ASA on  arrival.  UPDATE @ 1240 - Patient with some mild improved pain overall, remains on telemetry, vitals stable, CXR (negative for infiltrate, without acute findings). Troponin-I (negative @ 1130, about 6 hours after onset), CHEM panel unremarkable. Plan to try Nitro-SL x 1 dose to monitor response. Consider repeat Trponin-I in 3 hours (@ 1430).  UDPATE @ 1409 - Patient with improved chest pain s/p Nitro x 1 dose. Given patient's risk factors, with atypical CP with exertional component, calculated Heart Score 4, decision [facility]. Patient agrees with plan.  Discussed case with Dr. Lafe GarinGherge ([facility] will be accepting patient for admission to telemetry bed for chest pain.    Saralyn PilarAlexander Quinnten Calvin, DO 01/05/14 1446

## 2014-01-05 NOTE — Consult Note (Signed)
Referring Physician: Dr. Catha Gosselin Primary Physician: Dr. Kirtland Bouchard. 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 She had headache when she went to work.She was at work this morning about 6:30 AM. She felt as usual. She had had cereal for breakfast. She was putting the patient on dialysis and had sudden onset of substernal chest pain. It was sharp and stabbing. It was an 8/10. She then got left-sided neck and shoulder pain that was also sharp and 8/10. She did not tell anyone about this pain. The chest pain hurt worse with deep inspiration and so she felt a little short of breath. She was not diaphoretic or nauseated. When her symptoms did not resolve, she told a coworker and was sent to her primary care physician.   Dr. Beverely Low evaluated her and did an ECG. She was sent to [facility]. She received aspirin and sublingual nitroglycerin, which improved her pain. . She said that the discomfort went away after nitroglycerin however she took a nitroglycerin and fell asleep and when she woke up the pain was gone however she still had neck and arm pain which persists even now. She was transferred to [facility].  Currently, she still has the chest pain with deep inspiration and her chest wall is tender to palpation. Her neck and left shoulder still hurts, but it is not as severe as it was previously. She has had some occasional brief chest pains in the past, but never anything like this before. She does not exercise regularly but has no history of exertional symptoms.   Review of Systems:    Cardiac Review of Systems: {Y] = yes [ ]  = no  Chest Pain [  y  ]  Resting SOB [   ] Exertional SOB  [  ]  Orthopnea [  ]   Pedal Edema [   ]     Palpitations [  ] Syncope  [  ]   Presyncope [   ]  General Review of Systems: [Y] = yes [  ]=no Constitional: recent weight change [  ]; anorexia [  ]; fatigue [  ]; nausea [  ]; night sweats [  ]; fever [  ]; or chills [  ];                                                                                                                                          Dental: poor dentition[  ];   Eye : blurred vision [  ]; diplopia [   ]; vision changes [  ];  Amaurosis fugax[  ]; Resp: cough [  ];  wheezing[  ];  hemoptysis[  ]; shortness of breath[  ]; paroxysmal nocturnal dyspnea[  ]; dyspnea on exertion[  ]; or orthopnea[  ];  GI:  gallstones[  ], vomiting[  ];  dysphagia[  ]; melena[  ];  hematochezia [  ]; heartburn[  ];   Hx of  Colonoscopy[  ]; GU: kidney stones [  ]; hematuria[  ];   dysuria [  ];  nocturia[  ];  history of     obstruction [  ];                 Skin: rash, swelling[  ];, hair loss[  ];  peripheral edema[  ];  or itching[  ]; Musculosketetal: myalgias[ y ];  joint swelling[  ];  joint erythema[  ];  joint pain[  ];  back pain[  ];  Heme/Lymph: bruising[  ];  bleeding[  ];  anemia[  ];  Neuro: TIA[  ];  headaches[  ];  stroke[  ];  vertigo[  ];  seizures[  ];   paresthesias[  ];  difficulty walking[  ];  Psych:depression[  ]; anxiety[  ];  Endocrine: diabetes[  ];  thyroid dysfunction[  ];  Immunizations: Flu [  ]; Pneumococcal[  ];  Other:  Past Medical History  Diagnosis Date  . Hypertension   . Depression   . Hepatitis C   . Frequent headaches   . History of heroin abuse   . Anxiety    Past Surgical History  Procedure Laterality Date  . Ectopic pregnancy surgery    . Carpal tunnel release Left    Medications Prior to Admission  Medication Sig Dispense Refill  . ALPRAZolam (XANAX) 0.5 MG tablet Take 0.5 mg by mouth daily as needed for anxiety.      . benazepril-hydrochlorthiazide (LOTENSIN HCT) 10-12.5 MG per tablet Take 1 tablet by mouth daily.      Marland Kitchen  ibuprofen (ADVIL,MOTRIN) 800 MG tablet Take 800 mg by mouth 2 (two) times daily as needed for headache or mild pain.       . SUBOXONE 8-2 MG FILM Place 0.5 strips under the tongue 2 (two) times daily.      Marland Kitchen venlafaxine XR (EFFEXOR-XR) 150 MG 24 hr capsule Take 150 mg by mouth daily with breakfast.       No Known Allergies Scheduled Meds: . [START ON 01/06/2014] benazepril  10 mg Oral Daily   And  . [START ON 01/06/2014] hydrochlorothiazide  12.5 mg Oral Daily  . buPROPion  150 mg Oral Daily  . enoxaparin (LOVENOX) injection  40 mg Subcutaneous Q24H  . fluticasone  1 spray Each Nare Daily  . metoprolol tartrate  12.5 mg Oral BID  . [START ON 01/06/2014] venlafaxine XR  150 mg Oral Q breakfast   PRN Meds:.acetaminophen, ALPRAZolam, morphine injection, nitroGLYCERIN, ondansetron (ZOFRAN) IV  History   Social History  . Marital Status: Single    Spouse Name: N/A    Number of Children: 1  . Years of Education: N/A   Occupational History  . Dialysis Tech    Social History Main Topics  . Smoking status: Current Every Day Smoker -- 0.85 packs/day for 35 years    Types: Cigarettes  . Smokeless tobacco: Never Used  . Alcohol Use: No  . Drug Use: No  . Sexual Activity: Not on file   Other Topics Concern  . Not on file  Social History Narrative   Works at [employer] in [place]. Lives with daughter.   Family Status  Relation Status Death Age  . Mother Alive     No known CAD, born 8  . Father Deceased 51    MI    Family History  Problem Relation Age of Onset  . Drug abuse Brother   . Esophageal cancer Brother   . Hypertension Mother   . Diabetes Mother   . Colon cancer Brother   . Hypertension Sister     x's 2  . Diabetes Sister     oldest sister  . Stomach cancer Neg Hx     PHYSICAL EXAM: Filed Vitals:   01/05/14 1721  BP: 125/81  Pulse: 71  Temp: 98.5 F (36.9 C)  Resp: 15   General:  Well appearing. No respiratory difficulty HEENT: normal Neck: supple.  no JVD. Carotids 2+ bilat; no bruits. No lymphadenopathy or thryomegaly appreciated. Cor: PMI nondisplaced. Regular rate & rhythm. No rubs or gallops,  soft murmur noted, mild chest wall tenderness anteriorly Lungs: clear Abdomen: soft, nontender, nondistended. No hepatosplenomegaly. No bruits or masses. Good bowel sounds. Extremities: no cyanosis, clubbing, rash, no edema; distal pulses 2+ in all 4 extremities, no bruits noted Neuro: alert & oriented x 3, cranial nerves grossly intact. moves all 4 extremities w/o difficulty. Affect pleasant.  ECG: 01/05/2014 Sinus rhythm, no acute ischemic changes  Results for orders placed during the hospital encounter of 01/05/14 (from the past 24 hour(s))  TROPONIN I     Status: None   Collection Time    01/05/14 11:30 AM      Result Value Ref Range   Troponin I <0.30  <0.30 ng/mL  CBC     Status: None   Collection Time    01/05/14 11:30 AM      Result Value Ref Range   WBC 8.9  4.0 - 10.5 K/uL   RBC 4.56  3.87 - 5.11 MIL/uL   Hemoglobin 13.5  12.0 - 15.0 g/dL   HCT 09.8  11.9 - 14.7 %   MCV 88.8  78.0 - 100.0 fL   MCH 29.6  26.0 - 34.0 pg   MCHC 33.3  30.0 - 36.0 g/dL   RDW 82.9  56.2 - 13.0 %   Platelets 248  150 - 400 K/uL  I-STAT CHEM 8, ED     Status: Abnormal   Collection Time    01/05/14 11:36 AM      Result Value Ref Range   Sodium 141  137 - 147 mEq/L   Potassium 4.3  3.7 - 5.3 mEq/L   Chloride 98  96 - 112 mEq/L   BUN 16  6 - 23 mg/dL   Creatinine, Ser 8.65  0.50 - 1.10 mg/dL   Glucose, Bld 86  70 - 99 mg/dL   Calcium, Ion 7.84 (*) 1.12 - 1.23 mmol/L   TCO2 27  0 - 100 mmol/L   Hemoglobin 14.6  12.0 - 15.0 g/dL   HCT 69.6  29.5 - 28.4 %    Radiology: Chest x-ray shows a normal-sized heart, clear lung fields and no acute abnormality.  IMPRESSION:  1. Chest discomfort that has some atypical features that is ongoing in a patient with multiple risk factors 2. Hypertension poorly controlled 3. Obesity 4. Possible history  of hyperlipidemia 5. Tobacco abuse advised to stop  PLAN/DIS    Patient symptoms have many atypical features but she got improvement with nitroglycerin and has  multiple cardiac risk factors. Agree with admission and serial cardiac enzymes. If cardiac enzymes remain negative, will order a Lexi scan Myoview for a.m. As her ECG is not acute and initial enzymes are negative, we'll not heparinize at this time. Add daily aspirin 81 mg, agree with beta blocker and lipid profile. Further evaluation and treatment depending on results of this testing. One would wonder with the headache and the neck pain whether she could possibly have an element of cervical disc disease also. Needs to stop smoking.  Otherwise, per primary M.D.

## 2014-01-05 NOTE — Telephone Encounter (Signed)
Opened chart in error.

## 2014-01-05 NOTE — Progress Notes (Signed)
Pre visit review using our clinic review tool, if applicable. No additional management support is needed unless otherwise documented below in the visit note. 

## 2014-01-05 NOTE — ED Notes (Addendum)
Placed called to [facility] for transport, stated that [facility] were in [facility] and would be here in 1 hour, [facility] notifed and there transport canceled due to no available truck and waiting time

## 2014-01-05 NOTE — Telephone Encounter (Addendum)
Pt walked into the clinic today with complaints of left sided chest pain, left sided neck pain, and Headache/sinus pressure.  Chest pain started this morning.  Rated 8/10.  Described as sore, throbbing chest pain that worsens with normal respirations.   Denied shortness of breath, diaphoresis, nausea/vomiting, jaw pain.  Pt stated that she took Xanax today 0800 to see if it would relieve pain.  No relief.  Left sided neck pain radiated to the middle of patient's upper arm. Started this morning as well. Rated 8/10.  Described as a sharp, shooting pain down the side of neck to left arm.  Denies recent injury to neck or arm.  Head pressure-frontal lobe. + nasal drainage.    VS: T-98, P- 88, BP- 124/92, 02-95% RA   Spoke with Dr. Beverely Lowabori regarding patient's complaints.  Verbal order given for EKG.    EKG- Sinus rhythm.  Old anterior infarct.  Inverted T waves in V1.     Pt was [facility].  Offered to call EMS multiple times.  Pt declined each time.  Pt stated that she would drive herself.    Charge nurse, Nelva Bushorma was made aware that patient is soon to arrive.

## 2014-01-05 NOTE — ED Provider Notes (Signed)
I saw and evaluated the patient, reviewed the resident's note and I agree with the findings and plan.   EKG Interpretation See additional note including EKG interpretation          Shon Batonourtney F Kayen Grabel, MD 01/05/14 1455

## 2014-01-06 ENCOUNTER — Observation Stay (HOSPITAL_COMMUNITY): Payer: BC Managed Care – PPO

## 2014-01-06 DIAGNOSIS — R079 Chest pain, unspecified: Principal | ICD-10-CM

## 2014-01-06 DIAGNOSIS — R072 Precordial pain: Secondary | ICD-10-CM

## 2014-01-06 LAB — LIPID PANEL
CHOL/HDL RATIO: 5.4 ratio
CHOLESTEROL: 182 mg/dL (ref 0–200)
HDL: 34 mg/dL — AB (ref >39)
LDL Cholesterol: 122 mg/dL — ABNORMAL HIGH (ref 0–99)
TRIGLYCERIDES: 131 mg/dL (ref <150)
VLDL: 26 mg/dL (ref 0–40)

## 2014-01-06 LAB — TROPONIN I: Troponin I: <0.3 ng/mL (ref <0.30)

## 2014-01-06 MED ORDER — TECHNETIUM TC 99M SESTAMIBI GENERIC - CARDIOLITE
10.0000 | Freq: Once | INTRAVENOUS | Status: AC | PRN
  Administered 2014-01-06: 10 via INTRAVENOUS

## 2014-01-06 MED ORDER — TECHNETIUM TC 99M SESTAMIBI GENERIC - CARDIOLITE
30.0000 | Freq: Once | INTRAVENOUS | Status: AC | PRN
  Administered 2014-01-06: 30 via INTRAVENOUS

## 2014-01-06 MED ORDER — REGADENOSON 0.4 MG/5ML IV SOLN
INTRAVENOUS | Status: AC
  Filled 2014-01-06: qty 5

## 2014-01-06 MED ORDER — REGADENOSON 0.4 MG/5ML IV SOLN
0.4000 mg | Freq: Once | INTRAVENOUS | Status: AC
  Administered 2014-01-06: 0.4 mg via INTRAVENOUS
  Filled 2014-01-06: qty 5

## 2014-01-06 NOTE — Progress Notes (Signed)
  Echocardiogram 2D Echocardiogram has been performed.  Arvil ChacoFoster, Iyesha Such 01/06/2014, 2:17 PM

## 2014-01-06 NOTE — Progress Notes (Signed)
Down in nuclear stress lab. Dr. Donnie Aho note reviewed. Troponins have been normal. If low risk, okay with discharge. Continue with tobacco cessation.  Donato Schultz, MD

## 2014-01-06 NOTE — Discharge Summary (Addendum)
Physician Discharge Summary  Vanessa [name] ZOX:[id number] DOB: 04-Jul-1960 DOA: 01/05/2014  PCP: Neena Rhymes, MD  Admit date: 01/05/2014 Discharge date: 01/06/2014  Time spent: >45 minutes    Discharge Diagnoses:  Principal Problem:   Chest pain Active Problems:   Hypertension   Depression   Discharge Condition: stable  Diet recommendation: heart healthy  Filed Weights   01/05/14 1128 01/05/14 1721 01/06/14 0400  Weight: 77.111 kg (170 lb) 77.111 kg (170 lb) 77.212 kg (170 lb 3.6 oz)    History of present illness:  Vanessa [name] is a 53 y.o. female with a history of tobacco abuse, hypertension, depression and anxiety that presented to [facility] for chest pain. Patient was at work chest pain started. She states is a left-sided chest, characterized as a 9/10, sharp stabbing pain which radiated to her neck and left arm. Pain improved after receiving a nitroglycerin at the hospital. She denied any shortness of breath, nausea, vomiting, dizziness, diaphoresis with this pain. She denies any history of having the same pain in the past. She does not take any aspirin at home. Patient denies any lifting pushing pulling at work recently. 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 At this time patient no longer complains of chest pain.   Hospital Course:  Chest pain - 4 sets of Troponins were negative.  - underwent myoview stress test which was negative- pain did not recur while in the hospital - ECHO revealed EF of 60-65% with no wall motion abnormalities and Grade 1 diastolic dysfunction  Left neck and shoulder pain - advised to try Motrin and heat for next 2-3 days- if no resolution, can further discuss with her PCP  Smoker - advised to stop smoking  All other medical issues remained stable  Procedures:  Myoview stress test  Consultations:  Cardiology    Discharge Exam: Filed Vitals:   01/06/14 1356  BP: 146/80  Pulse: 87  Temp: 98.4 F (36.9 C)  Resp: 16    General: AAO x 3, no distress Cardiovascular: RRR, no murmurs Respiratory: CTA b/l  MSK: tenderness along left trapezius muscle  Discharge Instructions You were cared for by a hospitalist during your hospital stay. If you have any questions about your discharge medications or the care you received while you were in the hospital after you are discharged, you can call the unit and asked to speak with the hospitalist on call if the hospitalist that took care of you is not available. Once you are discharged, your primary care physician will handle any further medical issues. Please note that NO REFILLS for any discharge medications will be authorized once you are discharged, as it is imperative that you return to your primary care physician (or establish a relationship with a primary care physician if you do not have one) for your aftercare needs so that they can reassess your need for medications and monitor your lab values.     Medication List         ALPRAZolam 0.5 MG tablet  Commonly known as:  XANAX  Take 0.5 mg by mouth daily as needed for anxiety.     benazepril-hydrochlorthiazide 10-12.5 MG per tablet  Commonly known as:  LOTENSIN HCT  Take 1 tablet by mouth daily.     ibuprofen 800 MG tablet  Commonly known as:  ADVIL,MOTRIN  Take 800 mg by mouth 2 (two) times  daily as needed for headache or mild pain.     SUBOXONE 8-2 MG Film  Generic drug:  Buprenorphine HCl-Naloxone HCl  Place 0.5 strips under the tongue 2 (two) times daily.     venlafaxine XR 150 MG 24 hr capsule  Commonly known as:  EFFEXOR-XR  Take 150 mg by mouth daily with breakfast.       No Known Allergies Follow-up Information   Follow up with Neena Rhymes, MD. Call in 3 days.   Specialty:  Family Medicine   Contact information:   [id number] [place] [address] [phone]         The results of significant diagnostics from this hospitalization (including imaging, microbiology, ancillary and laboratory) are listed below for reference.    Significant Diagnostic Studies: Dg Chest 2 View  01/05/2014   CLINICAL DATA:  LEFT side chest pain for 1 day, history hypertension, hepatitis-C  EXAM: CHEST  2 VIEW  COMPARISON:  04/29/2013  FINDINGS: Normal heart size, mediastinal contours, and pulmonary vascularity.  Lungs clear.  No pleural effusion or pneumothorax.  Bones unremarkable.  IMPRESSION: No acute abnormalities.   Electronically Signed   By: Ulyses Southward M.D.   On: 01/05/2014 12:46   Nm Myocar Multi W/spect W/wall Motion / Ef  01/06/2014   CLINICAL DATA:  Chest pain, history hypertension, smoker  EXAM: MYOCARDIAL IMAGING WITH SPECT (REST AND PHARMACOLOGIC-STRESS)  GATED LEFT VENTRICULAR WALL MOTION STUDY  LEFT VENTRICULAR EJECTION FRACTION  TECHNIQUE: Standard myocardial SPECT imaging was performed after resting intravenous injection of 10 mCi Tc-13m sestamibi. Subsequently, intravenous infusion of Lexiscan was performed under the supervision of the Cardiology staff. At peak effect of the drug, 30 mCi Tc-36m sestamibi was injected intravenously and standard myocardial SPECT imaging was performed. Quantitative gated imaging was also performed to evaluate left ventricular wall motion, and estimate left ventricular ejection fraction.  COMPARISON:  None  FINDINGS: Myocardial perfusion SPECT images obtained following pharmacologic stress demonstrate diminished perfusion at the apex and the inferolateral walls of the LEFT ventricle.  Resting exam unchanged.  No pulmonary uptake of tracer.  Slightly elevated LEFT ventricular ejection fraction of 83% is calculated on gated SPECT images following pharmacologic stress, derived from an end-diastolic volume calculation of 46 mL and end systolic volume of 8 mL.  LEFT ventricular ejection fractions may be slightly overestimated in patients with  low calculated end-diastolic volumes.  Wall motion analysis of the gated SPECT images following pharmacologic stress demonstrates hypokinesia of the apex and inferolateral walls.  IMPRESSION: Non reversible decreased myocardial profusion involving the apex and inferolateral walls of the LEFT ventricle.  Mildly elevated LEFT ventricular ejection fraction of 83% with mild hypokinesia of the apex and inferolateral walls.   Electronically Signed   By: Ulyses Southward M.D.   On: 01/06/2014 15:26    Microbiology: No results found for this or any previous visit (from the past 240 hour(s)).   Labs: Basic Metabolic Panel:  Recent Labs Lab 01/05/14 1136 01/05/14 2003  NA 141  --   K 4.3  --   CL 98  --   GLUCOSE 86  --   BUN 16  --   CREATININE 0.70 0.66  MG  --  1.9  PHOS  --  3.4   Liver Function Tests: No results found for this basename: AST, ALT, ALKPHOS, BILITOT, PROT, ALBUMIN,  in the last 168 hours No results found for this basename: LIPASE, AMYLASE,  in the last 168 hours No results found  for this basename: AMMONIA,  in the last 168 hours CBC:  Recent Labs Lab 01/05/14 1130 01/05/14 1136 01/05/14 2003  WBC 8.9  --  7.9  HGB 13.5 14.6 13.0  HCT 40.5 43.0 39.6  MCV 88.8  --  88.0  PLT 248  --  248   Cardiac Enzymes:  Recent Labs Lab 01/05/14 1130 01/05/14 2003 01/06/14 0305 01/06/14 0844  TROPONINI <0.30 <0.30 <0.30 <0.30   BNP: BNP (last 3 results) No results found for this basename: PROBNP,  in the last 8760 hours CBG: No results found for this basename: GLUCAP,  in the last 168 hours     Signed:  Calvert CantorIZWAN,Quinci Gavidia, MD [facility] 01/06/2014, 4:20 PM

## 2014-01-06 NOTE — Progress Notes (Signed)
Utilization Review Completed.Nakema Fake T8/15/2015  

## 2014-01-06 NOTE — Progress Notes (Signed)
Subjective:   Objective: Vital signs in last 24 hours: Temp:  [98.3 F (36.8 C)-98.5 F (36.9 C)] 98.3 F (36.8 C) (08/15 0400) Pulse Rate:  [64-84] 67 (08/15 1125) Resp:  [11-21] 18 (08/15 0400) BP: (105-132)/(63-81) 105/67 mmHg (08/15 1125) SpO2:  [96 %-100 %] 96 % (08/15 0400) Weight:  [170 lb (77.111 kg)-170 lb 3.6 oz (77.212 kg)] 170 lb 3.6 oz (77.212 kg) (08/15 0400) Last BM Date: 01/04/14  Intake/Output from previous day:   Intake/Output this shift:    Medications Current Facility-Administered Medications  Medication Dose Route Frequency Provider Last Rate Last Dose  . acetaminophen (TYLENOL) tablet 650 mg  650 mg Oral Q4H PRN Maryann Mikhail, DO   650 mg at 01/05/14 1957  . ALPRAZolam Prudy Feeler) tablet 0.5 mg  0.5 mg Oral Daily PRN Maryann Mikhail, DO      . benazepril (LOTENSIN) tablet 10 mg  10 mg Oral Daily Suzan Slick McCallister, RPH   10 mg at 01/06/14 1610   And  . hydrochlorothiazide (MICROZIDE) capsule 12.5 mg  12.5 mg Oral Daily Suzan Slick McCallister, RPH   12.5 mg at 01/06/14 9604  . buPROPion (WELLBUTRIN XL) 24 hr tablet 150 mg  150 mg Oral Daily Maryann Mikhail, DO      . enoxaparin (LOVENOX) injection 40 mg  40 mg Subcutaneous Q24H Maryann Mikhail, DO   40 mg at 01/05/14 1957  . fluticasone (FLONASE) 50 MCG/ACT nasal spray 1 spray  1 spray Each Nare Daily Maryann Mikhail, DO   1 spray at 01/06/14 0904  . metoprolol tartrate (LOPRESSOR) tablet 12.5 mg  12.5 mg Oral BID Maryann Mikhail, DO   12.5 mg at 01/05/14 2236  . morphine 2 MG/ML injection 2 mg  2 mg Intravenous Q2H PRN Maryann Mikhail, DO      . nitroGLYCERIN (NITROSTAT) SL tablet 0.4 mg  0.4 mg Sublingual Q5 min PRN Shon Baton, MD   0.4 mg at 01/05/14 1300  . ondansetron (ZOFRAN) injection 4 mg  4 mg Intravenous Q6H PRN Maryann Mikhail, DO      . regadenoson (LEXISCAN) 0.4 MG/5ML injection SOLN           . regadenoson (LEXISCAN) injection SOLN 0.4 mg  0.4 mg Intravenous Once Wilburt Finlay, PA-C       . venlafaxine XR (EFFEXOR-XR) 24 hr capsule 150 mg  150 mg Oral Q breakfast Maryann Mikhail, DO   150 mg at 01/06/14 5409    PE: General appearance: alert, cooperative and no distress Lungs: clear to auscultation bilaterally Heart: regular rate and rhythm, S1, S2 normal, no murmur, click, rub or gallop Extremities: No LEE Pulses: 2+ and symmetric Skin: WArm and dry Neurologic: Grossly normal  Lab Results:   Recent Labs  01/05/14 1130 01/05/14 1136 01/05/14 2003  WBC 8.9  --  7.9  HGB 13.5 14.6 13.0  HCT 40.5 43.0 39.6  PLT 248  --  248   BMET  Recent Labs  01/05/14 1136 01/05/14 2003  NA 141  --   K 4.3  --   CL 98  --   GLUCOSE 86  --   BUN 16  --   CREATININE 0.70 0.66   PT/INR No results found for this basename: LABPROT, INR,  in the last 72 hours Cholesterol  Recent Labs  01/06/14 0305  CHOL 182   Lipid Panel     Component Value Date/Time   CHOL 182 01/06/2014 0305   TRIG 131 01/06/2014 0305  HDL 34* 01/06/2014 0305   CHOLHDL 5.4 01/06/2014 0305   VLDL 26 01/06/2014 0305   LDLCALC 122* 01/06/2014 0305    Cardiac Panel (last 3 results)  Recent Labs  01/05/14 2003 01/06/14 0305 01/06/14 0844  TROPONINI <0.30 <0.30 <0.30    Assessment/Plan    Active Problems: 1 Chest pain 2  Hypertension poorly controlled  3. Obesity  4. Possible history of hyperlipidemia  5. Tobacco abuse advised to stop  Plan:   Ruled out for MI.  Lexiscan completed.  Interpretation to follow.   LOS: 1 day    Yukari Flax PA-C 01/06/2014 11:32 AM

## 2014-01-06 NOTE — Progress Notes (Signed)
   Nuclear stress test low risk. No ischemia. Echocardiogram with normal left ventricular function. Troponin are negative.  Okay with discharge.  Donato SchultzSKAINS, Kenniya Westrich, MD

## 2014-01-07 ENCOUNTER — Other Ambulatory Visit: Payer: Self-pay | Admitting: Family Medicine

## 2014-01-07 NOTE — Progress Notes (Signed)
Agree with above. Albert Hersch, MD  

## 2014-01-08 ENCOUNTER — Telehealth: Payer: Self-pay

## 2014-01-08 NOTE — Telephone Encounter (Signed)
No answer.  Unable to leave message.  No mail box set up.  Will call back at a later time.

## 2014-01-08 NOTE — Telephone Encounter (Signed)
Med filled.  

## 2014-01-09 NOTE — Telephone Encounter (Signed)
No answer.  Unable to leave a message due to no mailbox.  Called work number.  Pt has not been back to work.

## 2014-01-11 NOTE — Telephone Encounter (Signed)
Unable to reach patient.

## 2014-01-12 ENCOUNTER — Ambulatory Visit (INDEPENDENT_AMBULATORY_CARE_PROVIDER_SITE_OTHER): Payer: BC Managed Care – PPO | Admitting: Family Medicine

## 2014-01-12 ENCOUNTER — Encounter: Payer: Self-pay | Admitting: Family Medicine

## 2014-01-12 VITALS — BP 124/80 | HR 87 | Temp 98.0°F | Resp 16 | Wt 181.0 lb

## 2014-01-12 DIAGNOSIS — E785 Hyperlipidemia, unspecified: Secondary | ICD-10-CM

## 2014-01-12 DIAGNOSIS — R079 Chest pain, unspecified: Secondary | ICD-10-CM

## 2014-01-12 NOTE — Progress Notes (Signed)
   Subjective:    Patient ID: Vanessa HakimSusie [name], female    DOB: 1960-06-27, 53 y.o.   MRN: [id number]  HPI Hospital f/u- pt was admitted on 8/14 for L sided chest pain and radiation to neck and shoulder.  Was d/c'd 8/15 after 4 sets of negative troponins and normal stress test.  ECHO showed normal EF 60-65% and grade 1 diastolic dysfxn.  No CP, shoulder and neck pain have improved, no SOB, N/V, HA, visual changes, edema.  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  Pt had lipid panel while inpt and was told it was borderline.  Pt reports she will need FMLA forms completed for the time she missed   Review of Systems For ROS see HPI     Objective:   Physical Exam  Vitals reviewed. Constitutional: She is oriented to person, place, and time. She appears well-developed and well-nourished. No distress.  HENT:  Head: Normocephalic and atraumatic.  Eyes: Conjunctivae and EOM are normal. Pupils are equal, round, and reactive to light.  Neck: Normal range of motion. Neck supple. No thyromegaly present.  Cardiovascular: Normal rate, regular rhythm, normal heart sounds and intact distal pulses.   No murmur heard. Pulmonary/Chest: Effort normal and breath sounds normal. No respiratory distress.  Abdominal: Soft. She exhibits no distension. There is no tenderness.  Musculoskeletal: She exhibits no edema.  Lymphadenopathy:    She has no cervical adenopathy.  Neurological: She is alert and oriented to person, place, and time.  Skin: Skin is warm and dry.  Psychiatric: She has a normal mood and affect. Her behavior is normal.          Assessment & Plan:

## 2014-01-12 NOTE — Patient Instructions (Signed)
Schedule your complete physical in 6 months NO HEART ATTACK!!!  Renee RivalYAY!! Keep up the good work, you look great! Call with any questions or concerns Enjoy the rest of your summer!!!

## 2014-01-12 NOTE — Assessment & Plan Note (Signed)
Resolved.  Pt had negative troponins x4, normal stress test and ECHO w/ only Grade 1 diastolic dysfunction.  Pt's likely cause of CP was musculoskeletal and this has complete resolved.

## 2014-01-12 NOTE — Assessment & Plan Note (Signed)
New.  Pt's LDL is 122 which is better than previous.  Due to negative stress test and fact that she has chronic Hep C, will not start statin at this time to avoid any further damage to the liver.  Pt expressed understanding and is in agreement w/ plan.

## 2014-01-12 NOTE — Progress Notes (Signed)
Pre visit review using our clinic review tool, if applicable. No additional management support is needed unless otherwise documented below in the visit note. 

## 2014-02-06 ENCOUNTER — Other Ambulatory Visit: Payer: Self-pay | Admitting: Family Medicine

## 2014-02-06 ENCOUNTER — Telehealth: Payer: Self-pay | Admitting: Family Medicine

## 2014-02-06 ENCOUNTER — Telehealth: Payer: Self-pay | Admitting: General Practice

## 2014-02-06 DIAGNOSIS — Z0279 Encounter for issue of other medical certificate: Secondary | ICD-10-CM

## 2014-02-06 NOTE — Telephone Encounter (Signed)
Pt dropped off FMLA paperwork. This was completed today by provider. Copy made and sent to scanning. Papers given to Ashlee to contact pt and route billing sheet.

## 2014-02-06 NOTE — Telephone Encounter (Signed)
Called and made patient aware that her FMLA paperwork is up front and available for pick up.  Pt stated that she would come back tomorrow during her break to pick it up.  Billing sheet placed in red billing folder.

## 2014-02-06 NOTE — Telephone Encounter (Signed)
This medication was filled and faxed.

## 2014-02-06 NOTE — Telephone Encounter (Signed)
Caller name:Mcintyre, Madeliene Relation to WG:NFAO Call back number:[phone] Pharmacy: [facility]   Reason for call: pt is needing new rx ALPRAZolam (XANAX) 0.5 MG tablet,

## 2014-02-06 NOTE — Telephone Encounter (Signed)
Last OV 01-12-14 Med filled 09-20-13 #30 with 2

## 2014-02-06 NOTE — Telephone Encounter (Signed)
Med filled and faxed.  

## 2014-02-12 NOTE — Telephone Encounter (Addendum)
Forms were brought back in by patient for revision.  Dr. Beverely Low reviewed form.  Called and left a message making patient aware that her FMLA paperwork is available for pick up.

## 2014-04-02 ENCOUNTER — Other Ambulatory Visit: Payer: Self-pay | Admitting: Family Medicine

## 2014-04-02 ENCOUNTER — Other Ambulatory Visit: Payer: Self-pay | Admitting: General Practice

## 2014-04-02 MED ORDER — BENAZEPRIL-HYDROCHLOROTHIAZIDE 10-12.5 MG PO TABS: 1.0000 | ORAL_TABLET | Freq: Every day | ORAL | Status: DC

## 2014-04-02 NOTE — Telephone Encounter (Signed)
Med filled.  

## 2014-04-05 ENCOUNTER — Ambulatory Visit: Payer: Self-pay | Admitting: Family Medicine

## 2014-04-06 ENCOUNTER — Ambulatory Visit (HOSPITAL_BASED_OUTPATIENT_CLINIC_OR_DEPARTMENT_OTHER)
Admission: RE | Admit: 2014-04-06 | Discharge: 2014-04-06 | Disposition: A | Discharge status: Home / Self Care | Payer: BC Managed Care – PPO | Source: Ambulatory Visit | Attending: Nurse Practitioner | Admitting: Nurse Practitioner

## 2014-04-06 ENCOUNTER — Encounter: Payer: Self-pay | Admitting: Nurse Practitioner

## 2014-04-06 ENCOUNTER — Ambulatory Visit: Payer: Self-pay | Admitting: Nurse Practitioner

## 2014-04-06 ENCOUNTER — Ambulatory Visit (INDEPENDENT_AMBULATORY_CARE_PROVIDER_SITE_OTHER): Payer: BC Managed Care – PPO | Admitting: Nurse Practitioner

## 2014-04-06 ENCOUNTER — Telehealth: Payer: Self-pay | Admitting: *Deleted

## 2014-04-06 VITALS — BP 119/80 | HR 71 | Temp 97.9°F | Resp 18 | Wt 180.8 lb

## 2014-04-06 DIAGNOSIS — R05 Cough: Secondary | ICD-10-CM | POA: Diagnosis present

## 2014-04-06 DIAGNOSIS — R059 Cough, unspecified: Secondary | ICD-10-CM

## 2014-04-06 DIAGNOSIS — J209 Acute bronchitis, unspecified: Secondary | ICD-10-CM

## 2014-04-06 DIAGNOSIS — J410 Simple chronic bronchitis: Secondary | ICD-10-CM

## 2014-04-06 DIAGNOSIS — R062 Wheezing: Secondary | ICD-10-CM

## 2014-04-06 DIAGNOSIS — F17210 Nicotine dependence, cigarettes, uncomplicated: Secondary | ICD-10-CM

## 2014-04-06 MED ORDER — ALBUTEROL SULFATE HFA 108 (90 BASE) MCG/ACT IN AERS: 2.0000 | INHALATION_SPRAY | Freq: Four times a day (QID) | RESPIRATORY_TRACT | Status: DC | PRN

## 2014-04-06 MED ORDER — AMOXICILLIN-POT CLAVULANATE 875-125 MG PO TABS: 1.0000 | ORAL_TABLET | Freq: Two times a day (BID) | ORAL | Status: AC

## 2014-04-06 MED ORDER — PREDNISONE 10 MG PO TABS: ORAL_TABLET | ORAL | Status: DC

## 2014-04-06 NOTE — Progress Notes (Signed)
See phone note

## 2014-04-06 NOTE — Assessment & Plan Note (Signed)
Cough is worse with wheezing and chest sounds are tight. Albuterol inhaler prescribed and prednisone taper.

## 2014-04-06 NOTE — Assessment & Plan Note (Signed)
Albuterol Inhaler prescribed today.

## 2014-04-06 NOTE — Patient Instructions (Signed)
Please go down to the first floor for a chest x-ray and take as deep a breath as possible The albuterol and prednisone taper will help with cough and wheezing If your symptoms worsen, change, or do not get better please don't hesitate to call us

## 2014-04-06 NOTE — Assessment & Plan Note (Signed)
CXR is neg. For pneumonia, but looks to indicated bronchitis. Will call in Augmentin script. See Cough and Wheezes for other interventions.

## 2014-04-06 NOTE — Telephone Encounter (Signed)
Notified pt and Rx sent. 

## 2014-04-06 NOTE — Progress Notes (Signed)
Pre visit review using our clinic review tool, if applicable. No additional management support is needed unless otherwise documented below in the visit note. 

## 2014-04-06 NOTE — Telephone Encounter (Signed)
-----   Message from Carollee Leitzarrie M Doss, NP sent at 04/06/2014 11:08 AM EST ----- Please call Ms. Lin GivensJeffries and tell her that it looks like she has bronchitis (pneumonia is negative). Stay hydrated! Will you call in to her pharmacy: Augmentin 875/125 mg PO BID x 7 days. # 14 No refills. Thank you!

## 2014-04-06 NOTE — Progress Notes (Signed)
   Subjective:    Patient ID: Vanessa [name], female    DOB: [birth year], 53 y.o.   MRN: [id number]  HPI  Vanessa [name] is a 53 yo female with a chief complaint of URI and possible pneumonia.   1) Cough- She has been experiencing a 1 week cough that is worse today. 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 She denies travel out of the country. In addition to a productive cough with yellow/green sputum she has been experiencing SOB x 1 week with wheezing, and pleuritic pain. She states when she coughs she has "pain all over my chest, stomach, and back". This was in the rib cage area when she demonstrated location. Yellow and green sputum from chest described as "a lot" in the morning time with less throughout the day. She also states she has frontal sinus pain and a sinus headache. She is still smoking 0.5 ppd.   Treatment to date includes mucinex x4 doses without relief, ibuprofen x 3 doses without relief.   Last CXR was 01/05/14 and was negative   She knew the appropriate date and president of the [place]  BP 119/80 mmHg  Pulse 71  Temp(Src) 97.9 F (36.6 C) (Oral)  Resp 18  Wt 180 lb 12.8 oz (82.01 kg)  SpO2 95%    Review of Systems  Constitutional: Positive for chills and fatigue. Negative for fever, diaphoresis, appetite change and unexpected weight change.  HENT: Positive for congestion, rhinorrhea, sinus pressure, sneezing and sore throat. Negative for dental problem, ear discharge, ear pain and postnasal drip.   Eyes: Negative for pain, discharge, itching and visual disturbance.  Respiratory: Positive for cough, chest tightness, shortness of breath and wheezing.   Gastrointestinal: Negative for nausea, vomiting, diarrhea and abdominal distention.  Musculoskeletal: Negative for myalgias, back pain, neck pain and neck stiffness.  Skin: Negative for rash.  Neurological: Positive for headaches. Negative for weakness and numbness.       States sinus  headaches        Objective:   Physical Exam  Constitutional: She is oriented to person, place, and time. She appears well-developed and well-nourished. No distress.  HENT:  Left ear TM dull and canal is red  Eyes: Conjunctivae and EOM are normal. Pupils are equal, round, and reactive to light. Right eye exhibits no discharge. Left eye exhibits no discharge. No scleral icterus.  Neck: Normal range of motion. Neck supple. No thyromegaly present.  Cardiovascular: Normal rate, regular rhythm, normal heart sounds and intact distal pulses.  Exam reveals no gallop and no friction rub.   No murmur heard. Pulmonary/Chest: No respiratory distress. She has wheezes. She has rales. She exhibits tenderness.  Abdominal: Soft. Bowel sounds are normal. She exhibits no distension and no mass. There is no tenderness. There is no rebound and no guarding.  Lymphadenopathy:    She has no cervical adenopathy.  Neurological: She is alert and oriented to person, place, and time.  Skin: Skin is warm and dry. No rash noted. She is not diaphoretic.  Psychiatric: She has a normal mood and affect. Her behavior is normal. Judgment and thought content normal.          Assessment & Plan:

## 2014-04-09 ENCOUNTER — Other Ambulatory Visit: Payer: Self-pay | Admitting: General Practice

## 2014-04-09 ENCOUNTER — Telehealth: Payer: Self-pay | Admitting: *Deleted

## 2014-04-09 ENCOUNTER — Ambulatory Visit: Payer: Self-pay | Admitting: Family Medicine

## 2014-04-09 MED ORDER — VENLAFAXINE HCL ER 150 MG PO CP24: ORAL_CAPSULE | ORAL | Status: DC

## 2014-04-09 NOTE — Telephone Encounter (Signed)
error 

## 2014-04-11 ENCOUNTER — Telehealth: Payer: Self-pay | Admitting: Family Medicine

## 2014-04-11 ENCOUNTER — Ambulatory Visit (INDEPENDENT_AMBULATORY_CARE_PROVIDER_SITE_OTHER): Payer: BC Managed Care – PPO | Admitting: Family Medicine

## 2014-04-11 ENCOUNTER — Encounter: Payer: Self-pay | Admitting: Family Medicine

## 2014-04-11 VITALS — BP 160/88 | HR 83 | Temp 97.9°F | Resp 18 | Wt 183.0 lb

## 2014-04-11 DIAGNOSIS — R062 Wheezing: Secondary | ICD-10-CM

## 2014-04-11 MED ORDER — PROMETHAZINE-DM 6.25-15 MG/5ML PO SYRP: 5.0000 mL | ORAL_SOLUTION | Freq: Four times a day (QID) | ORAL | Status: DC | PRN

## 2014-04-11 MED ORDER — ALBUTEROL SULFATE (2.5 MG/3ML) 0.083% IN NEBU
2.5000 mg | INHALATION_SOLUTION | Freq: Once | RESPIRATORY_TRACT | Status: AC
  Administered 2014-04-11: 2.5 mg via RESPIRATORY_TRACT

## 2014-04-11 MED ORDER — IPRATROPIUM-ALBUTEROL 0.5-2.5 (3) MG/3ML IN SOLN
3.0000 mL | Freq: Once | RESPIRATORY_TRACT | Status: AC
  Administered 2014-04-11: 3 mL via RESPIRATORY_TRACT

## 2014-04-11 NOTE — Progress Notes (Signed)
Pre visit review using our clinic review tool, if applicable. No additional management support is needed unless otherwise documented below in the visit note. 

## 2014-04-11 NOTE — Telephone Encounter (Signed)
Mrs. Vanessa [name] will need to be seen in the office. Please call and schedule this. Thank you.

## 2014-04-11 NOTE — Telephone Encounter (Signed)
Appointment scheduled for this afternoon.

## 2014-04-11 NOTE — Progress Notes (Signed)
   Subjective:    Patient ID: Vanessa HakimSusie [name], female    DOB: 01/15/1961, 53 y.o.   MRN: [id number]  HPI URI- pt was seen on 11/13 and dx'd w/ bronchitis and ear infxn.  Treated w/ Augmentin and started Pred taper on Monday.  Pt reports increased difficulty w/ breathing- increased SOB.  Tm 100 on Friday evening.  Using inhaler w/o relief.  Pt having respiratory distress w/ exertion or long sentences.  + tobacco use.   Review of Systems For ROS see HPI     Objective:   Physical Exam  Constitutional: She is oriented to person, place, and time. She appears well-developed and well-nourished. She appears distressed (audibly wheezing w/ SOB).  HENT:  Head: Normocephalic and atraumatic.  Mouth/Throat: Oropharynx is clear and moist.  No TTP over sinuses  Neck: Normal range of motion. Neck supple.  Cardiovascular: Normal rate, regular rhythm and normal heart sounds.   Pulmonary/Chest: She is in respiratory distress (improved w/ 1st neb.  resolved w/ 2nd). She has wheezes (diffuse inspiratory and expiratory wheezes- audible w/o stethoscope.  minimal improvement after 1st neb.  much improved after 2nd).  Increased WOB  Lymphadenopathy:    She has no cervical adenopathy.  Neurological: She is alert and oriented to person, place, and time.  Skin: Skin is warm and dry.  Psychiatric: She has a normal mood and affect. Her behavior is normal. Thought content normal.  Vitals reviewed.         Assessment & Plan:

## 2014-04-11 NOTE — Assessment & Plan Note (Signed)
New to provider.  Given smoking hx, suspect some component of underlying COPD w/ acute exacerbation.  Pt already on abx.  Started pred taper 2 days ago.  Instructed to use inhaler regularly.  Took over 1 hr and 2 nebs to improve pt's SOB and wheezing today in office.  O2 sats improved to normal levels.  Stressed need for smoking cessation.  Cough meds provided.  Reviewed supportive care and red flags that should prompt return.  Pt expressed understanding and is in agreement w/ plan.

## 2014-04-11 NOTE — Patient Instructions (Signed)
Follow up as needed Finish the Augmentin as directed Continue the Prednisone as directed to improve your chest tightness/wheezing Use the Albuterol inhaler as needed for cough/wheezing/shortness of breath Use the cough syrup as needed- will cause drowsiness Drink plenty of fluids Please quit smoking!!! Call with any questions or concerns Happy Thanksgiving!!

## 2014-04-11 NOTE — Telephone Encounter (Signed)
Caller name: Courtnee Relation to pt: self Call back number: [phone] Pharmacy:  Reason for call:   Patient was seen last week and states that she is not feeling any better. She states that she is still wheezing.

## 2014-05-10 ENCOUNTER — Other Ambulatory Visit: Payer: Self-pay | Admitting: General Practice

## 2014-05-10 MED ORDER — ALPRAZOLAM 0.5 MG PO TABS: ORAL_TABLET | ORAL | Status: DC

## 2014-06-21 ENCOUNTER — Inpatient Hospital Stay (HOSPITAL_BASED_OUTPATIENT_CLINIC_OR_DEPARTMENT_OTHER): Admission: RE | Admit: 2014-06-21 | Payer: BLUE CROSS/BLUE SHIELD | Source: Ambulatory Visit

## 2014-06-21 ENCOUNTER — Telehealth: Payer: Self-pay | Admitting: Medical

## 2014-06-21 ENCOUNTER — Encounter: Payer: Self-pay | Admitting: Medical

## 2014-06-21 ENCOUNTER — Other Ambulatory Visit: Payer: BLUE CROSS/BLUE SHIELD

## 2014-06-21 ENCOUNTER — Ambulatory Visit (HOSPITAL_BASED_OUTPATIENT_CLINIC_OR_DEPARTMENT_OTHER)
Admission: RE | Admit: 2014-06-21 | Discharge: 2014-06-21 | Disposition: A | Discharge status: Home / Self Care | Payer: BLUE CROSS/BLUE SHIELD | Source: Ambulatory Visit | Attending: Medical | Admitting: Medical

## 2014-06-21 ENCOUNTER — Ambulatory Visit (INDEPENDENT_AMBULATORY_CARE_PROVIDER_SITE_OTHER): Payer: BLUE CROSS/BLUE SHIELD | Admitting: Medical

## 2014-06-21 VITALS — BP 132/90 | HR 89 | Temp 98.1°F | Ht 62.2 in | Wt 181.4 lb

## 2014-06-21 DIAGNOSIS — K7200 Acute and subacute hepatic failure without coma: Secondary | ICD-10-CM

## 2014-06-21 DIAGNOSIS — K8020 Calculus of gallbladder without cholecystitis without obstruction: Secondary | ICD-10-CM | POA: Diagnosis not present

## 2014-06-21 DIAGNOSIS — R1011 Right upper quadrant pain: Secondary | ICD-10-CM

## 2014-06-21 DIAGNOSIS — R109 Unspecified abdominal pain: Secondary | ICD-10-CM | POA: Diagnosis present

## 2014-06-21 LAB — COMPLETE METABOLIC PANEL WITH GFR
ALT: 54 U/L — ABNORMAL HIGH (ref 0–35)
AST: 44 U/L — AB (ref 0–37)
Albumin: 4.1 g/dL (ref 3.5–5.2)
Alkaline Phosphatase: 73 U/L (ref 39–117)
BUN: 12 mg/dL (ref 6–23)
CHLORIDE: 100 meq/L (ref 96–112)
CO2: 24 meq/L (ref 19–32)
Calcium: 9.4 mg/dL (ref 8.4–10.5)
Creat: 0.6 mg/dL (ref 0.50–1.10)
GFR, Est Non African American: >89 mL/min
Glucose, Bld: 104 mg/dL — ABNORMAL HIGH (ref 70–99)
Potassium: 4.2 mEq/L (ref 3.5–5.3)
Sodium: 135 mEq/L (ref 135–145)
TOTAL PROTEIN: 8.2 g/dL (ref 6.0–8.3)
Total Bilirubin: 0.4 mg/dL (ref 0.2–1.2)

## 2014-06-21 LAB — CBC WITH DIFFERENTIAL/PLATELET
BASOS PCT: 1.4 % (ref 0.0–3.0)
Basophils Absolute: 0.1 10*3/uL (ref 0.0–0.1)
EOS PCT: 1.5 % (ref 0.0–5.0)
Eosinophils Absolute: 0.1 10*3/uL (ref 0.0–0.7)
HCT: 44.3 % (ref 36.0–46.0)
HEMOGLOBIN: 14.9 g/dL (ref 12.0–15.0)
LYMPHS ABS: 2.3 10*3/uL (ref 0.7–4.0)
Lymphocytes Relative: 31.4 % (ref 12.0–46.0)
MCHC: 33.6 g/dL (ref 30.0–36.0)
MCV: 86.7 fl (ref 78.0–100.0)
MONO ABS: 0.2 10*3/uL (ref 0.1–1.0)
MONOS PCT: 3.2 % (ref 3.0–12.0)
Neutro Abs: 4.6 10*3/uL (ref 1.4–7.7)
Neutrophils Relative %: 62.5 % (ref 43.0–77.0)
PLATELETS: 222 10*3/uL (ref 150.0–400.0)
RBC: 5.12 Mil/uL — AB (ref 3.87–5.11)
RDW: 14.7 % (ref 11.5–15.5)
WBC: 7.4 10*3/uL (ref 4.0–10.5)

## 2014-06-21 LAB — H. PYLORI ANTIBODY, IGG: H PYLORI IGG: NEGATIVE

## 2014-06-21 LAB — LIPASE: Lipase: 10 U/L (ref 0–75)

## 2014-06-21 MED ORDER — CIPROFLOXACIN HCL 500 MG PO TABS: 500.0000 mg | ORAL_TABLET | Freq: Two times a day (BID) | ORAL | Status: DC

## 2014-06-21 NOTE — Assessment & Plan Note (Addendum)
You have abdominal pain that may be from your gallbladder. Pain after eating in ruq and pain no exam causes some suspicion.  You have epigastric pain. I would recommend very healthy diet and zantac otc after tesiting done.  I will put lab orders for cmp, lipase, cbc, and h. Pylori.  I will put order for stat abdominal ultrasound.  If your abdomen pain were to worsen or change after hours pending lab results then would advise ED evaluation.

## 2014-06-21 NOTE — Telephone Encounter (Signed)
Question on cbc.

## 2014-06-21 NOTE — Progress Notes (Signed)
Pre visit review using our clinic review tool, if applicable. No additional management support is needed unless otherwise documented below in the visit note. 

## 2014-06-21 NOTE — Patient Instructions (Addendum)
You have abdominal pain that may be from your gallbladder. Pain after eating in ruq and pain no exam causes some suspicion.  You have some epigastric pain. Would recommend very healthy diet and zantac otc after testing done.  I will put lab orders for cmp, lipase, cbc, and h. Pylori.  I will put order for stat abdominal ultrasound.  If your abdomen pain were to worsen or change after hours pending lab results then would advise ED evaluation.  Follow up in 7 days or as needed.  Post US her gallbladder was thickened. Early cholycystitis is possible. I will rx cipro antibiotic and follow cbc. Will try to refer pt to general surgeon by Monday this coming week(maybe tomorrow). But advise her if wbc real high or if her pain worsens then she will go to ED.

## 2014-06-21 NOTE — Telephone Encounter (Signed)
Note to lab. 

## 2014-06-21 NOTE — Progress Notes (Signed)
   Subjective:    Patient ID: Vanessa [name], female    DOB: 1960-09-28, 54 y.o.   MRN: [id number]  HPI   Pt in stating with epigastric area pain and some upperquadrants. Pt states pain going on for about a week. Some history reflux in the past but not daily. Pt states some pain that is worse after eating. Anything will hurt. At onset greasy foods made pain worse. Pt eating very little since yesterday. No vomiting. She feels some nausea. Pt does have her gallbladder. Also has gallbladder. Pt has mild chills but no fever.    Review of Systems  Constitutional: Positive for chills. Negative for fever and fatigue.  Respiratory: Negative for cough, shortness of breath and wheezing.   Cardiovascular: Negative for chest pain and palpitations.  Gastrointestinal: Positive for nausea and abdominal pain. Negative for vomiting, diarrhea, constipation, blood in stool and anal bleeding.  Genitourinary: Negative for dysuria, urgency, frequency, hematuria, flank pain, genital sores and pelvic pain.  Musculoskeletal: Negative for back pain.       No cva pain.  Skin: Negative for pallor and wound.  Neurological: Negative for dizziness, seizures, syncope, speech difficulty, weakness, numbness and headaches.  Hematological: Negative for adenopathy. Does not bruise/bleed easily.       Objective:   Physical Exam   General Appearance- Not in acute distress.  HEENT Eyes- Scleraeral/Conjuntiva-bilat- Not Yellow. Mouth & Throat- Normal.  Chest and Lung Exam Auscultation: Breath sounds:-Normal. Adventitious sounds:- No Adventitious sounds.  Cardiovascular Auscultation:Rythm - Regular. Heart Sounds -Normal heart sounds.  Abdomen Inspection:-Inspection Normal.  Palpation/Perucssion: Palpation and Percussion of the abdomen reveal-  Tender epigastric but more so in ruq(no appendix region pain), No Rebound tenderness, No rigidity(Guarding) and No Palpable abdominal masses.  Liver:-Normal.  Spleen:-  Normal.   Back- no cva tenderness          Assessment & Plan:

## 2014-06-22 ENCOUNTER — Telehealth: Payer: Self-pay | Admitting: Family Medicine

## 2014-06-22 NOTE — Telephone Encounter (Signed)
Caller name: Vayda Relation to pt: self Call back number: [phone] Pharmacy:  Reason for call:   Patient requesting last lab results. Saw Edward for this.

## 2014-06-22 NOTE — Telephone Encounter (Signed)
error 

## 2014-06-27 NOTE — Telephone Encounter (Signed)
Question on pt lab/lipase. Will send question to [facility]David in lab.

## 2014-06-27 NOTE — Telephone Encounter (Signed)
Looks like it was finally resulted. ?

## 2014-07-01 ENCOUNTER — Other Ambulatory Visit: Payer: Self-pay | Admitting: Family Medicine

## 2014-07-02 NOTE — Telephone Encounter (Signed)
Last filled: 04/02/14 Amt: 90, 0 Last OV:  04/11/14 Last labs: 06/21/14 Next appt: 07/17/14  Med filled x 1 mth

## 2014-07-04 ENCOUNTER — Telehealth: Payer: Self-pay | Admitting: Medical

## 2014-07-04 NOTE — Telephone Encounter (Signed)
Message to lab regarding lipase.

## 2014-07-04 NOTE — Telephone Encounter (Signed)
I see a final result of( 10 ) for lipase on the 28th of January.

## 2014-07-10 ENCOUNTER — Other Ambulatory Visit: Payer: Self-pay | Admitting: Family Medicine

## 2014-07-11 NOTE — Telephone Encounter (Signed)
Med filled.  

## 2014-07-17 ENCOUNTER — Encounter: Payer: Self-pay | Admitting: Family Medicine

## 2014-07-17 ENCOUNTER — Other Ambulatory Visit: Payer: Self-pay | Admitting: General Practice

## 2014-07-17 ENCOUNTER — Other Ambulatory Visit (HOSPITAL_COMMUNITY)
Admission: RE | Admit: 2014-07-17 | Discharge: 2014-07-17 | Disposition: A | Discharge status: Home / Self Care | Payer: BLUE CROSS/BLUE SHIELD | Source: Ambulatory Visit | Attending: Family Medicine | Admitting: Family Medicine

## 2014-07-17 ENCOUNTER — Ambulatory Visit (INDEPENDENT_AMBULATORY_CARE_PROVIDER_SITE_OTHER): Payer: BLUE CROSS/BLUE SHIELD | Admitting: Family Medicine

## 2014-07-17 VITALS — BP 142/88 | HR 79 | Temp 98.2°F | Resp 16 | Ht 62.0 in | Wt 182.5 lb

## 2014-07-17 DIAGNOSIS — K8020 Calculus of gallbladder without cholecystitis without obstruction: Secondary | ICD-10-CM | POA: Insufficient documentation

## 2014-07-17 DIAGNOSIS — Z1151 Encounter for screening for human papillomavirus (HPV): Secondary | ICD-10-CM | POA: Insufficient documentation

## 2014-07-17 DIAGNOSIS — Z124 Encounter for screening for malignant neoplasm of cervix: Secondary | ICD-10-CM | POA: Insufficient documentation

## 2014-07-17 DIAGNOSIS — Z01419 Encounter for gynecological examination (general) (routine) without abnormal findings: Secondary | ICD-10-CM | POA: Insufficient documentation

## 2014-07-17 DIAGNOSIS — Z0000 Encounter for general adult medical examination without abnormal findings: Secondary | ICD-10-CM | POA: Insufficient documentation

## 2014-07-17 DIAGNOSIS — B182 Chronic viral hepatitis C: Secondary | ICD-10-CM

## 2014-07-17 DIAGNOSIS — Z1239 Encounter for other screening for malignant neoplasm of breast: Secondary | ICD-10-CM

## 2014-07-17 DIAGNOSIS — M25561 Pain in right knee: Secondary | ICD-10-CM | POA: Insufficient documentation

## 2014-07-17 LAB — CBC WITH DIFFERENTIAL/PLATELET
Basophils Absolute: 0.1 10*3/uL (ref 0.0–0.1)
Basophils Relative: 0.7 % (ref 0.0–3.0)
Eosinophils Absolute: 0.1 10*3/uL (ref 0.0–0.7)
Eosinophils Relative: 1 % (ref 0.0–5.0)
HCT: 39.8 % (ref 36.0–46.0)
Hemoglobin: 13.5 g/dL (ref 12.0–15.0)
LYMPHS PCT: 29.4 % (ref 12.0–46.0)
Lymphs Abs: 2.2 10*3/uL (ref 0.7–4.0)
MCHC: 34 g/dL (ref 30.0–36.0)
MCV: 87 fl (ref 78.0–100.0)
MONOS PCT: 3.8 % (ref 3.0–12.0)
Monocytes Absolute: 0.3 10*3/uL (ref 0.1–1.0)
NEUTROS PCT: 65.1 % (ref 43.0–77.0)
Neutro Abs: 4.8 10*3/uL (ref 1.4–7.7)
PLATELETS: 234 10*3/uL (ref 150.0–400.0)
RBC: 4.58 Mil/uL (ref 3.87–5.11)
RDW: 14.8 % (ref 11.5–15.5)
WBC: 7.3 10*3/uL (ref 4.0–10.5)

## 2014-07-17 LAB — LIPID PANEL
CHOLESTEROL: 188 mg/dL (ref 0–200)
HDL: 38.1 mg/dL — AB (ref >39.00)
LDL Cholesterol: 126 mg/dL — ABNORMAL HIGH (ref 0–99)
NONHDL: 149.9
TRIGLYCERIDES: 122 mg/dL (ref 0.0–149.0)
Total CHOL/HDL Ratio: 5
VLDL: 24.4 mg/dL (ref 0.0–40.0)

## 2014-07-17 LAB — BASIC METABOLIC PANEL
BUN: 14 mg/dL (ref 6–23)
CHLORIDE: 104 meq/L (ref 96–112)
CO2: 30 mEq/L (ref 19–32)
Calcium: 9.2 mg/dL (ref 8.4–10.5)
Creatinine, Ser: 0.61 mg/dL (ref 0.40–1.20)
GFR: 131.96 mL/min (ref >60.00)
Glucose, Bld: 90 mg/dL (ref 70–99)
Potassium: 4 mEq/L (ref 3.5–5.1)
Sodium: 138 mEq/L (ref 135–145)

## 2014-07-17 LAB — VITAMIN D 25 HYDROXY (VIT D DEFICIENCY, FRACTURES): VITD: 23.17 ng/mL — ABNORMAL LOW (ref 30.00–100.00)

## 2014-07-17 LAB — HEPATIC FUNCTION PANEL
ALK PHOS: 52 U/L (ref 39–117)
ALT: 37 U/L — ABNORMAL HIGH (ref 0–35)
AST: 31 U/L (ref 0–37)
Albumin: 4 g/dL (ref 3.5–5.2)
BILIRUBIN DIRECT: 0.1 mg/dL (ref 0.0–0.3)
Total Bilirubin: 0.3 mg/dL (ref 0.2–1.2)
Total Protein: 7.9 g/dL (ref 6.0–8.3)

## 2014-07-17 LAB — TSH: TSH: 1.47 u[IU]/mL (ref 0.35–4.50)

## 2014-07-17 MED ORDER — VITAMIN D (ERGOCALCIFEROL) 1.25 MG (50000 UNIT) PO CAPS: 50000.0000 [IU] | ORAL_CAPSULE | ORAL | Status: DC

## 2014-07-17 NOTE — Assessment & Plan Note (Signed)
New.  Pt was referred to surgery but did not go b/c of cost.  Since she is asymptomatic at this time, she is not willing to go.  Reports she will call if sxs return.

## 2014-07-17 NOTE — Assessment & Plan Note (Signed)
Pap collected. 

## 2014-07-17 NOTE — Assessment & Plan Note (Signed)
Chronic problem.  Pt has been referred multiple times but has never followed up as directed.  Reports she 'really really wants to this time'.  Will re-refer.

## 2014-07-17 NOTE — Progress Notes (Signed)
Pre visit review using our clinic review tool, if applicable. No additional management support is needed unless otherwise documented below in the visit note. 

## 2014-07-17 NOTE — Patient Instructions (Signed)
Follow up in 6 months to recheck BP We'll notify you of your lab results and make any changes if needed Try healthy food choices and get regular exercise We'll call you with your Sports Med appt and Hepatitis appt They'll call you to schedule your mammogram Call with any questions or concerns Happy Early Birthday!!

## 2014-07-17 NOTE — Assessment & Plan Note (Signed)
Pt's PE WNL.  UTD on colonoscopy.  Due for mammo- referral entered.  Pap collected today.  Check labs.  Anticipatory guidance provided.

## 2014-07-17 NOTE — Progress Notes (Signed)
   Subjective:    Patient ID: Vanessa HakimSusie Mczeal, female    DOB: 02/26/1961, 54 y.o.   MRN: [id number]  HPI CPE- due for pap, mammo ([facility]).  UTD on colonoscopy.     Review of Systems Patient reports no vision/ hearing changes, adenopathy,fever, weight change,  persistant/recurrent hoarseness , swallowing issues, chest pain, palpitations, edema, persistant/recurrent cough, hemoptysis, dyspnea (rest/exertional/paroxysmal nocturnal), gastrointestinal bleeding (melena, rectal bleeding), significant heartburn, bowel changes, GU symptoms (dysuria, hematuria, incontinence), Gyn symptoms (abnormal  bleeding, pain),  syncope, focal weakness, memory loss, numbness & tingling, skin/hair/nail changes, abnormal bruising or bleeding, anxiety, or depression.   Recent abd pain w/ US showing gallstones.  Pt never went to surgeon- wants to hold at this time based on co-pay L knee pain- pt now interested in seeing Sports Med  Reviewed meds, allergies, problem list, and PMH in chart     Objective:   Physical Exam  General Appearance:    Alert, cooperative, no distress, appears stated age  Head:    Normocephalic, without obvious abnormality, atraumatic  Eyes:    PERRL, conjunctiva/corneas clear, EOM's intact, fundi    benign, both eyes  Ears:    Normal TM's and external ear canals, both ears  Nose:   Nares normal, septum midline, mucosa normal, no drainage    or sinus tenderness  Throat:   Lips, mucosa, and tongue normal; teeth and gums normal  Neck:   Supple, symmetrical, trachea midline, no adenopathy;    Thyroid: no enlargement/tenderness/nodules  Back:     Symmetric, no curvature, ROM normal, no CVA tenderness  Lungs:     Clear to auscultation bilaterally, respirations unlabored  Chest Wall:    No tenderness or deformity   Heart:    Regular rate and rhythm, S1 and S2 normal, no murmur, rub   or gallop  Breast Exam:    No tenderness, masses, or nipple abnormality  Abdomen:     Soft, non-tender,  bowel sounds active all four quadrants,    no masses, no organomegaly  Genitalia:    External genitalia normal, cervix normal in appearance, no CMT, uterus in normal size and position, adnexa w/out mass or tenderness, mucosa pink and moist, no lesions or discharge present  Rectal:    Normal external appearance  Extremities:   Extremities normal, atraumatic, no cyanosis or edema  Pulses:   2+ and symmetric all extremities  Skin:   Skin color, texture, turgor normal, no rashes or lesions  Lymph nodes:   Cervical, supraclavicular, and axillary nodes normal  Neurologic:   CNII-XII intact, normal strength, sensation and reflexes    throughout          Assessment & Plan:

## 2014-07-17 NOTE — Assessment & Plan Note (Signed)
Recurring problem for pt.  Will refer to Sports Med for evaluation and tx.

## 2014-07-18 ENCOUNTER — Telehealth: Payer: Self-pay | Admitting: Family Medicine

## 2014-07-18 LAB — CYTOLOGY - PAP

## 2014-07-18 NOTE — Telephone Encounter (Signed)
emmi emailed °

## 2014-07-19 ENCOUNTER — Ambulatory Visit: Payer: BLUE CROSS/BLUE SHIELD | Admitting: Family Medicine

## 2014-07-20 ENCOUNTER — Telehealth: Payer: Self-pay | Admitting: *Deleted

## 2014-07-20 NOTE — Telephone Encounter (Signed)
Called patient and left voice mail regarding referral to this clinic for Hep C. Patient needs to be scheduled for labs. Referral on hold, awaiting call back. Wendall MolaJacqueline Cockerham CMA

## 2014-07-25 ENCOUNTER — Ambulatory Visit: Payer: BLUE CROSS/BLUE SHIELD | Admitting: Family Medicine

## 2014-07-29 ENCOUNTER — Other Ambulatory Visit: Payer: Self-pay | Admitting: Family Medicine

## 2014-07-30 ENCOUNTER — Other Ambulatory Visit: Payer: Self-pay | Admitting: General Practice

## 2014-07-30 MED ORDER — BENAZEPRIL-HYDROCHLOROTHIAZIDE 10-12.5 MG PO TABS: 1.0000 | ORAL_TABLET | Freq: Every day | ORAL | Status: DC

## 2014-07-30 NOTE — Telephone Encounter (Signed)
Med filled.  

## 2014-07-31 NOTE — Telephone Encounter (Signed)
Left another voice mail regarding referral. Patient has not returned call to [facility] clinic.

## 2014-08-13 ENCOUNTER — Telehealth: Payer: Self-pay | Admitting: General Practice

## 2014-08-13 MED ORDER — ALPRAZOLAM 0.5 MG PO TABS: ORAL_TABLET | ORAL | Status: DC

## 2014-08-13 NOTE — Telephone Encounter (Signed)
Last OV 07-17-14 Alprazolam last filled 05-10-14 #30 with 2  CSC, low risk

## 2014-08-13 NOTE — Telephone Encounter (Signed)
Rx refill in PCPs absent. Rx printed signed and faxed to pharmacy.

## 2014-08-27 ENCOUNTER — Telehealth: Payer: Self-pay | Admitting: *Deleted

## 2014-08-27 NOTE — Telephone Encounter (Signed)
Noted.  Pt will need to call if she again expresses interest in treatment

## 2014-08-27 NOTE — Telephone Encounter (Signed)
Attempted to reach patient again regarding referral to this clinic for Hep C. Tried to reach in February of 2016 also. If patient is interested in treatment she can contact this clinic for lab work. Vanessa [name]

## 2014-08-31 ENCOUNTER — Other Ambulatory Visit: Payer: Self-pay | Admitting: Family Medicine

## 2014-10-05 ENCOUNTER — Other Ambulatory Visit: Payer: Self-pay | Admitting: Family Medicine

## 2014-10-05 NOTE — Telephone Encounter (Signed)
Med filled.  

## 2014-11-09 ENCOUNTER — Other Ambulatory Visit: Payer: Self-pay | Admitting: Family Medicine

## 2014-11-09 NOTE — Telephone Encounter (Signed)
Last ov 07-17-14 Alprazolam last filled 08/13/14 #30 with 2   CSC, low risk

## 2014-11-20 ENCOUNTER — Ambulatory Visit (INDEPENDENT_AMBULATORY_CARE_PROVIDER_SITE_OTHER): Payer: BLUE CROSS/BLUE SHIELD | Admitting: Medical

## 2014-11-20 ENCOUNTER — Encounter: Payer: Self-pay | Admitting: Medical

## 2014-11-20 VITALS — BP 130/76 | HR 83 | Temp 98.2°F | Ht 62.0 in | Wt 179.2 lb

## 2014-11-20 DIAGNOSIS — K8020 Calculus of gallbladder without cholecystitis without obstruction: Secondary | ICD-10-CM

## 2014-11-20 MED ORDER — RANITIDINE HCL 150 MG PO CAPS: 150.0000 mg | ORAL_CAPSULE | Freq: Two times a day (BID) | ORAL | Status: DC

## 2014-11-20 MED ORDER — CIPROFLOXACIN HCL 500 MG PO TABS: 500.0000 mg | ORAL_TABLET | Freq: Two times a day (BID) | ORAL | Status: DC

## 2014-11-20 NOTE — Progress Notes (Signed)
Subjective:    Patient ID: Vanessa HakimSusie Gargus, female    DOB: Nov 07, 1960, 54 y.o.   MRN: [id number]  HPI  Pt in with upper abdominal pain. Pain started on Sunday. Pt states not associated with eating. Can have pain even without foods. Pain will come and go and not associated with eating. No back pain with this pain.  Pt states on Sunday took ampicilin tablet from dentist. Prior rx and she thinks that did help.  No fever, no chills or sweats. No vomiting. Nausea on Sunday. No nausea since Sunday. No diarrhea. Last bm was today and normal.  Pt has hx of reflux but she thinks this is not case.  Pt thinks this is her gallbladder. Pt has gallstones and I saw her in January. She thinks pain is same as back then.  Pt has drank no alcohol.  I referred pt to GI in the past. She did not go to that apointment.    Review of Systems  Constitutional: Negative for fever, chills, diaphoresis, activity change and fatigue.  Respiratory: Negative for cough, chest tightness and shortness of breath.   Cardiovascular: Negative for chest pain, palpitations and leg swelling.  Gastrointestinal: Positive for nausea and abdominal pain. Negative for vomiting.       See hpi  Musculoskeletal: Negative for neck pain and neck stiffness.  Neurological: Negative for dizziness, light-headedness, numbness and headaches.  Psychiatric/Behavioral: Negative for behavioral problems, confusion and agitation. The patient is not nervous/anxious.      Past Medical History  Diagnosis Date  . Hypertension   . Depression   . Hepatitis C   . Anxiety     History   Social History  . Marital Status: Single    Spouse Name: N/A  . Number of Children: 1  . Years of Education: N/A   Occupational History  . Dialysis Tech    Social History Main Topics  . Smoking status: Current Every Day Smoker -- 1.00 packs/day for 35 years    Types: Cigarettes  . Smokeless tobacco: Never Used  . Alcohol Use: Yes     Comment: "last  alcohol was in the 1990's"  . Drug Use: Yes    Special: Marijuana, "Crack" cocaine     Comment: "last drug use was in the 1990's; never used heroin"  . Sexual Activity: Not Currently   Other Topics Concern  . Not on file   Social History Narrative   Works at [employer] in [place]. Lives with daughter.    Past Surgical History  Procedure Laterality Date  . Ectopic pregnancy surgery    . Carpal tunnel release Left     Family History  Problem Relation Age of Onset  . Drug abuse Brother   . Esophageal cancer Brother   . Hypertension Mother   . Diabetes Mother   . Colon cancer Brother   . Hypertension Sister     x's 2  . Diabetes Sister     oldest sister  . Stomach cancer Neg Hx     No Known Allergies  Current Outpatient Prescriptions on File Prior to Visit  Medication Sig Dispense Refill  . albuterol (PROVENTIL HFA;VENTOLIN HFA) 108 (90 BASE) MCG/ACT inhaler Inhale 2 puffs into the lungs every 6 (six) hours as needed for wheezing or shortness of breath. 1 Inhaler 2  . ALPRAZolam (XANAX) 0.5 MG tablet TAKE 1 TABLET BY MOUTH EVERY DAY AS NEEDED 30 tablet 0  . benazepril-hydrochlorthiazide (LOTENSIN HCT) 10-12.5 MG per tablet Take 1  tablet by mouth daily. 90 tablet 1  . ibuprofen (ADVIL,MOTRIN) 800 MG tablet TAKE 1 TABLET THREE TIMES A DAY AS NEEDED 30 tablet 5  . SUBOXONE 8-2 MG FILM Place 0.5 strips under the tongue 2 (two) times daily.    Marland Kitchen venlafaxine XR (EFFEXOR-XR) 150 MG 24 hr capsule TAKE 1 CAPSULE (150 MG TOTAL) BY MOUTH DAILY WITH BREAKFAST. 90 capsule 1  . Vitamin D, Ergocalciferol, (DRISDOL) 50000 UNITS CAPS capsule Take 1 capsule (50,000 Units total) by mouth every 7 (seven) days. 4 capsule 3   No current facility-administered medications on file prior to visit.    BP 130/76 mmHg  Pulse 83  Temp(Src) 98.2 F (36.8 C) (Oral)  Ht  (1.575 m)  Wt 179 lb 3.2 oz (81.285 kg)  BMI 32.77 kg/m2  SpO2 96%       Objective:   Physical Exam  General  Appearance- Not in acute distress.  HEENT Eyes- Scleraeral/Conjuntiva-bilat- Not Yellow. Mouth & Throat- Normal.  Chest and Lung Exam Auscultation: Breath sounds:-Normal. Adventitious sounds:- No Adventitious sounds.  Cardiovascular Auscultation:Rythm - Regular. Heart Sounds -Normal heart sounds.  Abdomen Inspection:-Inspection Normal.  Palpation/Perucssion: Palpation and Percussion of the abdomen reveal- epigastric and ruq  Tender moderate, No Rebound tenderness, No rigidity(Guarding) and No Palpable abdominal masses.  Liver:-Normal.  Spleen:- Normal.   Back - no cva tenderness.        Assessment & Plan:

## 2014-11-20 NOTE — Progress Notes (Signed)
Pre visit review using our clinic review tool, if applicable. No additional management support is needed unless otherwise documented below in the visit note. 

## 2014-11-20 NOTE — Patient Instructions (Signed)
Gallstones Hx of gallstones and thickened wall. Pain on exam epigastric and ruq. Will get cbc,cmp, amylase, lipase, and us abd stat. Rx cipro.. rx ranitidine. Refer to General surgeon again. If pain abd worsens pendng refer to surgeon then ED evaluation. Follow up in 7 days or as needed   Other dx are possible but by exam and history favors recurrent pain from gallbladder.

## 2014-11-20 NOTE — Assessment & Plan Note (Addendum)
Hx of gallstones and thickened wall. Pain on exam epigastric and ruq. Will get cbc,cmp, amylase, lipase, and us abd stat. Rx cipro.. rx ranitidine. Refer to General surgeon again. If pain abd worsens pendng refer to surgeon then ED evaluation. Follow up in 7 days or as needed

## 2014-11-21 LAB — COMPREHENSIVE METABOLIC PANEL
ALK PHOS: 58 U/L (ref 39–117)
ALT: 29 U/L (ref 0–35)
AST: 30 U/L (ref 0–37)
Albumin: 4 g/dL (ref 3.5–5.2)
BUN: 13 mg/dL (ref 6–23)
CALCIUM: 9.6 mg/dL (ref 8.4–10.5)
CHLORIDE: 100 meq/L (ref 96–112)
CO2: 34 mEq/L — ABNORMAL HIGH (ref 19–32)
CREATININE: 0.81 mg/dL (ref 0.40–1.20)
GFR: 95 mL/min (ref >60.00)
GLUCOSE: 78 mg/dL (ref 70–99)
POTASSIUM: 4.6 meq/L (ref 3.5–5.1)
SODIUM: 140 meq/L (ref 135–145)
Total Bilirubin: 0.3 mg/dL (ref 0.2–1.2)
Total Protein: 8.3 g/dL (ref 6.0–8.3)

## 2014-11-21 LAB — CBC WITH DIFFERENTIAL/PLATELET
Basophils Absolute: 0 10*3/uL (ref 0.0–0.1)
Basophils Relative: 0.4 % (ref 0.0–3.0)
Eosinophils Absolute: 0.1 10*3/uL (ref 0.0–0.7)
Eosinophils Relative: 1.8 % (ref 0.0–5.0)
HCT: 43 % (ref 36.0–46.0)
HEMOGLOBIN: 14.2 g/dL (ref 12.0–15.0)
Lymphocytes Relative: 34.9 % (ref 12.0–46.0)
Lymphs Abs: 2.7 10*3/uL (ref 0.7–4.0)
MCHC: 33 g/dL (ref 30.0–36.0)
MCV: 89.1 fl (ref 78.0–100.0)
MONOS PCT: 5.5 % (ref 3.0–12.0)
Monocytes Absolute: 0.4 10*3/uL (ref 0.1–1.0)
NEUTROS ABS: 4.4 10*3/uL (ref 1.4–7.7)
NEUTROS PCT: 57.4 % (ref 43.0–77.0)
Platelets: 216 10*3/uL (ref 150.0–400.0)
RBC: 4.82 Mil/uL (ref 3.87–5.11)
RDW: 15 % (ref 11.5–15.5)
WBC: 7.8 10*3/uL (ref 4.0–10.5)

## 2014-11-21 LAB — AMYLASE: Amylase: 24 U/L — ABNORMAL LOW (ref 27–131)

## 2014-11-21 LAB — LIPASE: Lipase: 16 U/L (ref 11.0–59.0)

## 2014-11-23 ENCOUNTER — Telehealth: Payer: Self-pay | Admitting: Family Medicine

## 2014-11-23 ENCOUNTER — Encounter (HOSPITAL_COMMUNITY): Payer: Self-pay | Admitting: Emergency Medicine

## 2014-11-23 ENCOUNTER — Observation Stay (HOSPITAL_BASED_OUTPATIENT_CLINIC_OR_DEPARTMENT_OTHER)
Admission: EM | Admit: 2014-11-23 | Discharge: 2014-11-25 | Disposition: A | Discharge status: Home / Self Care | Payer: BLUE CROSS/BLUE SHIELD | Attending: Surgery | Admitting: Surgery

## 2014-11-23 ENCOUNTER — Ambulatory Visit (HOSPITAL_BASED_OUTPATIENT_CLINIC_OR_DEPARTMENT_OTHER)
Admission: RE | Admit: 2014-11-23 | Discharge: 2014-11-23 | Disposition: A | Discharge status: Home / Self Care | Payer: BLUE CROSS/BLUE SHIELD | Source: Ambulatory Visit | Attending: Medical | Admitting: Medical

## 2014-11-23 DIAGNOSIS — F1290 Cannabis use, unspecified, uncomplicated: Secondary | ICD-10-CM | POA: Diagnosis not present

## 2014-11-23 DIAGNOSIS — F17220 Nicotine dependence, chewing tobacco, uncomplicated: Secondary | ICD-10-CM | POA: Insufficient documentation

## 2014-11-23 DIAGNOSIS — B1920 Unspecified viral hepatitis C without hepatic coma: Secondary | ICD-10-CM | POA: Diagnosis not present

## 2014-11-23 DIAGNOSIS — Z419 Encounter for procedure for purposes other than remedying health state, unspecified: Secondary | ICD-10-CM

## 2014-11-23 DIAGNOSIS — K8020 Calculus of gallbladder without cholecystitis without obstruction: Secondary | ICD-10-CM

## 2014-11-23 DIAGNOSIS — I10 Essential (primary) hypertension: Secondary | ICD-10-CM | POA: Insufficient documentation

## 2014-11-23 DIAGNOSIS — K8050 Calculus of bile duct without cholangitis or cholecystitis without obstruction: Secondary | ICD-10-CM | POA: Diagnosis present

## 2014-11-23 DIAGNOSIS — R110 Nausea: Secondary | ICD-10-CM | POA: Diagnosis not present

## 2014-11-23 DIAGNOSIS — F1190 Opioid use, unspecified, uncomplicated: Secondary | ICD-10-CM | POA: Diagnosis not present

## 2014-11-23 DIAGNOSIS — K811 Chronic cholecystitis: Secondary | ICD-10-CM | POA: Diagnosis present

## 2014-11-23 DIAGNOSIS — F329 Major depressive disorder, single episode, unspecified: Secondary | ICD-10-CM | POA: Insufficient documentation

## 2014-11-23 DIAGNOSIS — K5900 Constipation, unspecified: Secondary | ICD-10-CM | POA: Insufficient documentation

## 2014-11-23 DIAGNOSIS — Z6833 Body mass index (BMI) 33.0-33.9, adult: Secondary | ICD-10-CM | POA: Insufficient documentation

## 2014-11-23 DIAGNOSIS — F419 Anxiety disorder, unspecified: Secondary | ICD-10-CM | POA: Insufficient documentation

## 2014-11-23 DIAGNOSIS — Z79899 Other long term (current) drug therapy: Secondary | ICD-10-CM | POA: Insufficient documentation

## 2014-11-23 DIAGNOSIS — M25569 Pain in unspecified knee: Secondary | ICD-10-CM | POA: Insufficient documentation

## 2014-11-23 DIAGNOSIS — R109 Unspecified abdominal pain: Secondary | ICD-10-CM | POA: Insufficient documentation

## 2014-11-23 DIAGNOSIS — E669 Obesity, unspecified: Secondary | ICD-10-CM | POA: Diagnosis not present

## 2014-11-23 DIAGNOSIS — K8010 Calculus of gallbladder with chronic cholecystitis without obstruction: Principal | ICD-10-CM | POA: Insufficient documentation

## 2014-11-23 DIAGNOSIS — Z9049 Acquired absence of other specified parts of digestive tract: Secondary | ICD-10-CM

## 2014-11-23 HISTORY — DX: Other chronic pain: G89.29

## 2014-11-23 HISTORY — DX: Pain in unspecified knee: M25.569

## 2014-11-23 HISTORY — DX: Calculus of gallbladder without cholecystitis without obstruction: K80.20

## 2014-11-23 LAB — COMPREHENSIVE METABOLIC PANEL
ALBUMIN: 3.7 g/dL (ref 3.5–5.0)
ALT: 30 U/L (ref 14–54)
ANION GAP: 10 (ref 5–15)
AST: 28 U/L (ref 15–41)
Alkaline Phosphatase: 55 U/L (ref 38–126)
BUN: 14 mg/dL (ref 6–20)
CALCIUM: 8.9 mg/dL (ref 8.9–10.3)
CO2: 28 mmol/L (ref 22–32)
Chloride: 100 mmol/L — ABNORMAL LOW (ref 101–111)
Creatinine, Ser: 0.73 mg/dL (ref 0.44–1.00)
GFR calc Af Amer: >60 mL/min (ref >60)
GFR calc non Af Amer: >60 mL/min (ref >60)
Glucose, Bld: 108 mg/dL — ABNORMAL HIGH (ref 65–99)
POTASSIUM: 4.4 mmol/L (ref 3.5–5.1)
SODIUM: 138 mmol/L (ref 135–145)
Total Bilirubin: 0.2 mg/dL — ABNORMAL LOW (ref 0.3–1.2)
Total Protein: 8.1 g/dL (ref 6.5–8.1)

## 2014-11-23 LAB — CBC WITH DIFFERENTIAL/PLATELET
Basophils Absolute: 0 10*3/uL (ref 0.0–0.1)
Basophils Relative: 0 % (ref 0–1)
EOS ABS: 0.1 10*3/uL (ref 0.0–0.7)
EOS PCT: 1 % (ref 0–5)
HCT: 42.6 % (ref 36.0–46.0)
Hemoglobin: 14.2 g/dL (ref 12.0–15.0)
LYMPHS PCT: 38 % (ref 12–46)
Lymphs Abs: 3.2 10*3/uL (ref 0.7–4.0)
MCH: 29.9 pg (ref 26.0–34.0)
MCHC: 33.3 g/dL (ref 30.0–36.0)
MCV: 89.7 fL (ref 78.0–100.0)
Monocytes Absolute: 0.5 10*3/uL (ref 0.1–1.0)
Monocytes Relative: 6 % (ref 3–12)
Neutro Abs: 4.7 10*3/uL (ref 1.7–7.7)
Neutrophils Relative %: 55 % (ref 43–77)
PLATELETS: 143 10*3/uL — AB (ref 150–400)
RBC: 4.75 MIL/uL (ref 3.87–5.11)
RDW: 13.9 % (ref 11.5–15.5)
WBC: 8.5 10*3/uL (ref 4.0–10.5)

## 2014-11-23 LAB — URINALYSIS, ROUTINE W REFLEX MICROSCOPIC
Bilirubin Urine: NEGATIVE
Glucose, UA: NEGATIVE mg/dL
Hgb urine dipstick: NEGATIVE
KETONES UR: NEGATIVE mg/dL
Leukocytes, UA: NEGATIVE
Nitrite: NEGATIVE
PH: 5 (ref 5.0–8.0)
Protein, ur: NEGATIVE mg/dL
SPECIFIC GRAVITY, URINE: 1.01 (ref 1.005–1.030)
Urobilinogen, UA: 0.2 mg/dL (ref 0.0–1.0)

## 2014-11-23 LAB — LIPASE, BLOOD: Lipase: 14 U/L — ABNORMAL LOW (ref 22–51)

## 2014-11-23 MED ORDER — KCL IN DEXTROSE-NACL 20-5-0.45 MEQ/L-%-% IV SOLN
INTRAVENOUS | Status: DC
  Administered 2014-11-23 – 2014-11-24 (×2): 100 mL/h via INTRAVENOUS
  Administered 2014-11-25: 05:00:00 via INTRAVENOUS
  Filled 2014-11-23 (×5): qty 1000

## 2014-11-23 MED ORDER — HYDROCHLOROTHIAZIDE 12.5 MG PO CAPS
12.5000 mg | ORAL_CAPSULE | Freq: Every day | ORAL | Status: DC
  Administered 2014-11-24 – 2014-11-25 (×2): 12.5 mg via ORAL
  Filled 2014-11-23 (×3): qty 1

## 2014-11-23 MED ORDER — SODIUM CHLORIDE 0.9 % IV SOLN
INTRAVENOUS | Status: DC
  Administered 2014-11-23: 18:00:00 via INTRAVENOUS

## 2014-11-23 MED ORDER — BENAZEPRIL-HYDROCHLOROTHIAZIDE 10-12.5 MG PO TABS: 1.0000 | ORAL_TABLET | Freq: Every day | ORAL | Status: DC

## 2014-11-23 MED ORDER — DIPHENHYDRAMINE HCL 50 MG/ML IJ SOLN: 12.5000 mg | Freq: Four times a day (QID) | INTRAMUSCULAR | Status: DC | PRN

## 2014-11-23 MED ORDER — BENAZEPRIL HCL 10 MG PO TABS
10.0000 mg | ORAL_TABLET | Freq: Every day | ORAL | Status: DC
  Administered 2014-11-24 – 2014-11-25 (×2): 10 mg via ORAL
  Filled 2014-11-23 (×3): qty 1

## 2014-11-23 MED ORDER — HEPARIN SODIUM (PORCINE) 5000 UNIT/ML IJ SOLN
5000.0000 [IU] | Freq: Once | INTRAMUSCULAR | Status: AC
  Administered 2014-11-23: 5000 [IU] via SUBCUTANEOUS
  Filled 2014-11-23: qty 1

## 2014-11-23 MED ORDER — DIPHENHYDRAMINE HCL 12.5 MG/5ML PO ELIX: 12.5000 mg | ORAL_SOLUTION | Freq: Four times a day (QID) | ORAL | Status: DC | PRN

## 2014-11-23 MED ORDER — VENLAFAXINE HCL ER 150 MG PO CP24
150.0000 mg | ORAL_CAPSULE | Freq: Every day | ORAL | Status: DC
  Administered 2014-11-24 – 2014-11-25 (×2): 150 mg via ORAL
  Filled 2014-11-23 (×4): qty 1

## 2014-11-23 MED ORDER — PANTOPRAZOLE SODIUM 40 MG IV SOLR
40.0000 mg | Freq: Every day | INTRAVENOUS | Status: DC
  Administered 2014-11-23 – 2014-11-24 (×2): 40 mg via INTRAVENOUS
  Filled 2014-11-23 (×3): qty 40

## 2014-11-23 MED ORDER — PROMETHAZINE HCL 25 MG/ML IJ SOLN: 12.5000 mg | Freq: Four times a day (QID) | INTRAMUSCULAR | Status: DC | PRN

## 2014-11-23 MED ORDER — ONDANSETRON HCL 4 MG/2ML IJ SOLN
4.0000 mg | INTRAMUSCULAR | Status: AC | PRN
  Administered 2014-11-23 (×2): 4 mg via INTRAVENOUS
  Filled 2014-11-23 (×2): qty 2

## 2014-11-23 MED ORDER — ONDANSETRON HCL 4 MG/2ML IJ SOLN
4.0000 mg | Freq: Four times a day (QID) | INTRAMUSCULAR | Status: DC | PRN
  Administered 2014-11-24: 4 mg via INTRAVENOUS

## 2014-11-23 MED ORDER — ACETAMINOPHEN 650 MG RE SUPP: 650.0000 mg | Freq: Four times a day (QID) | RECTAL | Status: DC | PRN

## 2014-11-23 MED ORDER — CEFTRIAXONE SODIUM IN DEXTROSE 40 MG/ML IV SOLN
2.0000 g | INTRAVENOUS | Status: DC
  Administered 2014-11-23 – 2014-11-24 (×2): 2 g via INTRAVENOUS
  Filled 2014-11-23: qty 50

## 2014-11-23 MED ORDER — ACETAMINOPHEN 325 MG PO TABS: 650.0000 mg | ORAL_TABLET | Freq: Four times a day (QID) | ORAL | Status: DC | PRN

## 2014-11-23 MED ORDER — MORPHINE SULFATE 2 MG/ML IJ SOLN
1.0000 mg | INTRAMUSCULAR | Status: DC | PRN
  Administered 2014-11-23 – 2014-11-24 (×3): 2 mg via INTRAVENOUS
  Filled 2014-11-23 (×3): qty 1

## 2014-11-23 MED ORDER — MORPHINE SULFATE 4 MG/ML IJ SOLN
4.0000 mg | INTRAMUSCULAR | Status: AC | PRN
  Administered 2014-11-23 (×2): 4 mg via INTRAVENOUS
  Filled 2014-11-23 (×2): qty 1

## 2014-11-23 NOTE — Telephone Encounter (Signed)
Spoke with patient who requested we call [facility] to give them a heads-up that she was on the way and why we were recommended she be seen (see result note re: Ultrasound).  Spoke to charge nurse at [facility] and she stated understanding.

## 2014-11-23 NOTE — ED Notes (Signed)
MD at bedside. Surgery at bedside

## 2014-11-23 NOTE — H&P (Signed)
Vanessa [name] is an 54 y.o. female.   Chief Complaint: upper abd pain HPI: 54 yo wf with HTN, hep c, and known cholelithiasis reports ruq pain since this past Sunday with constant pain since Tuesday comes to ED at direction of her PCP for evaluation. Pt has been having intermittent colicky upper abdominal pain (RUQ rad to back and band like sensation across upper abd) for past 8 months or so. Episodes were very infrequent at first. Saw PCP earlier this week and had labs which were normal and repeat US which showed GS and a thicker gb wall. Pt reports nausea, decreased PO this week due to pain. No f/c/v/diarrhea/jaundice/weight loss.   Denies ETOH/drugs.   Past Medical History  Diagnosis Date  . Hypertension   . Depression   . Hepatitis C   . Anxiety   . Gall stone   . Chronic knee pain     Past Surgical History  Procedure Laterality Date  . Ectopic pregnancy surgery    . Carpal tunnel release Left     Family History  Problem Relation Age of Onset  . Drug abuse Brother   . Esophageal cancer Brother   . Hypertension Mother   . Diabetes Mother   . Colon cancer Brother   . Hypertension Sister     x's 2  . Diabetes Sister     oldest sister  . Stomach cancer Neg Hx    Social History:  reports that she has been smoking Cigarettes.  She has a 35 pack-year smoking history. She has never used smokeless tobacco. She reports that she drinks alcohol. She reports that she uses illicit drugs (Marijuana and "Crack" cocaine).  Allergies: No Known Allergies   (Not in a hospital admission)  Results for orders placed or performed during the hospital encounter of 11/23/14 (from the past 48 hour(s))  CBC with Differential     Status: Abnormal   Collection Time: 11/23/14  4:44 PM  Result Value Ref Range   WBC 8.5 4.0 - 10.5 K/uL   RBC 4.75 3.87 - 5.11 MIL/uL   Hemoglobin 14.2 12.0 - 15.0 g/dL   HCT 42.6 36.0 - 46.0 %   MCV 89.7 78.0 - 100.0 fL   MCH 29.9 26.0 - 34.0 pg   MCHC 33.3 30.0  - 36.0 g/dL   RDW 13.9 11.5 - 15.5 %   Platelets 143 (L) 150 - 400 K/uL   Neutrophils Relative % 55 43 - 77 %   Neutro Abs 4.7 1.7 - 7.7 K/uL   Lymphocytes Relative 38 12 - 46 %   Lymphs Abs 3.2 0.7 - 4.0 K/uL   Monocytes Relative 6 3 - 12 %   Monocytes Absolute 0.5 0.1 - 1.0 K/uL   Eosinophils Relative 1 0 - 5 %   Eosinophils Absolute 0.1 0.0 - 0.7 K/uL   Basophils Relative 0 0 - 1 %   Basophils Absolute 0.0 0.0 - 0.1 K/uL  Comprehensive metabolic panel     Status: Abnormal   Collection Time: 11/23/14  4:44 PM  Result Value Ref Range   Sodium 138 135 - 145 mmol/L   Potassium 4.4 3.5 - 5.1 mmol/L   Chloride 100 (L) 101 - 111 mmol/L   CO2 28 22 - 32 mmol/L   Glucose, Bld 108 (H) 65 - 99 mg/dL   BUN 14 6 - 20 mg/dL   Creatinine, Ser 0.73 0.44 - 1.00 mg/dL   Calcium 8.9 8.9 - 10.3 mg/dL   Total Protein  8.1 6.5 - 8.1 g/dL   Albumin 3.7 3.5 - 5.0 g/dL   AST 28 15 - 41 U/L   ALT 30 14 - 54 U/L   Alkaline Phosphatase 55 38 - 126 U/L   Total Bilirubin 0.2 (L) 0.3 - 1.2 mg/dL   GFR calc non Af Amer >60 >60 mL/min   GFR calc Af Amer >60 >60 mL/min    Comment: (NOTE) The eGFR has been calculated using the CKD EPI equation. This calculation has not been validated in all clinical situations. eGFR's persistently <60 mL/min signify possible Chronic Kidney Disease.    Anion gap 10 5 - 15  Lipase, blood     Status: Abnormal   Collection Time: 11/23/14  4:44 PM  Result Value Ref Range   Lipase 14 (L) 22 - 51 U/L  Urinalysis, Routine w reflex microscopic (not at [facility])     Status: Abnormal   Collection Time: 11/23/14  5:25 PM  Result Value Ref Range   Color, Urine YELLOW YELLOW   APPearance CLOUDY (A) CLEAR   Specific Gravity, Urine 1.010 1.005 - 1.030   pH 5.0 5.0 - 8.0   Glucose, UA NEGATIVE NEGATIVE mg/dL   Hgb urine dipstick NEGATIVE NEGATIVE   Bilirubin Urine NEGATIVE NEGATIVE   Ketones, ur NEGATIVE NEGATIVE mg/dL   Protein, ur NEGATIVE NEGATIVE mg/dL   Urobilinogen, UA 0.2  0.0 - 1.0 mg/dL   Nitrite NEGATIVE NEGATIVE   Leukocytes, UA NEGATIVE NEGATIVE    Comment: MICROSCOPIC NOT DONE ON URINES WITH NEGATIVE PROTEIN, BLOOD, LEUKOCYTES, NITRITE, OR GLUCOSE <1000 mg/dL.   US Abdomen Complete  11/23/2014   CLINICAL DATA:  Worsening upper abdominal pain extending into the back with nausea for 6 days. History of cholelithiasis, hypertension and hepatitis-C. Initial encounter.  EXAM: ULTRASOUND ABDOMEN COMPLETE  COMPARISON:  Ultrasound 06/21/2014.  FINDINGS: Gallbladder: The gallbladder is filled with multiple shadowing gallstones. There is mildly progressive wall thickening to 4 mm. Sonographic Percell Miller sign is reported as positive.  Common bile duct: Diameter: 3 mm.  Liver: Mildly increased echogenicity without focal abnormality.  IVC: No abnormality visualized.  Pancreas: Partially obscured by bowel gas and not optimally visualized.  Spleen: Size and appearance within normal limits.  Right Kidney: Length: 9.9 cm. Echogenicity within normal limits. No mass or hydronephrosis visualized.  Left Kidney: Length: 9.7 cm. Echogenicity within normal limits. No mass or hydronephrosis visualized.  Abdominal aorta: Partially obscured by bowel gas. No abnormality identified.  Other findings: None.  IMPRESSION: 1. Persistent abnormal appearance of the gallbladder with multiple gallstones and gallbladder wall thickening. The wall thickening has progressed compared with the prior study and the sonographic Percell Miller sign is now reported as positive. These findings are suspicious for acute on chronic cholecystitis. 2. No other significant findings.   Electronically Signed   By: Richardean Sale M.D.   On: 11/23/2014 09:02    Review of Systems  Constitutional: Negative for fever, chills and weight loss.       Smokes daily, 1 pack last 3 days  HENT: Negative for nosebleeds.   Eyes: Negative for blurred vision.  Respiratory: Negative for shortness of breath.   Cardiovascular: Negative for chest pain,  palpitations, orthopnea and PND.       Denies DOE  Gastrointestinal: Positive for nausea, abdominal pain and constipation. Negative for diarrhea, blood in stool and melena.       +hep c  Genitourinary: Negative for dysuria and hematuria.  Musculoskeletal: Negative.   Skin: Negative for itching and  rash.  Neurological: Negative for dizziness, focal weakness, seizures, loss of consciousness and headaches.       Denies TIAs, amaurosis fugax  Endo/Heme/Allergies: Does not bruise/bleed easily.  Psychiatric/Behavioral: Positive for depression. The patient is not nervous/anxious.     Blood pressure 116/78, pulse 74, temperature 98.1 F (36.7 C), temperature source Oral, resp. rate 18, SpO2 95 %. Physical Exam  Vitals reviewed. Constitutional: She is oriented to person, place, and time. She appears well-developed and well-nourished. No distress.  HENT:  Head: Normocephalic and atraumatic.  Right Ear: External ear normal.  Left Ear: External ear normal.  Eyes: Conjunctivae are normal. No scleral icterus.  Neck: Normal range of motion. Neck supple. No tracheal deviation present. No thyromegaly present.  Cardiovascular: Normal rate and normal heart sounds.   Respiratory: Effort normal and breath sounds normal. No stridor. No respiratory distress. She has no wheezes.  GI: Soft. She exhibits no distension. There is no tenderness. There is no rebound and no guarding.  Soft, obese, no rt/guarding. Nontender. Really not tender in RUQ/epigastrium  Musculoskeletal: She exhibits no edema or tenderness.  Lymphadenopathy:    She has no cervical adenopathy.  Neurological: She is alert and oriented to person, place, and time. She exhibits normal muscle tone.  Skin: Skin is warm and dry. No rash noted. She is not diaphoretic. No erythema. No pallor.  Psychiatric: She has a normal mood and affect. Her behavior is normal. Judgment and thought content normal.     Assessment/Plan Chronic cholecystitis with  calculous  Depression HTN Hep C Tobacco use  There may be a mild component of acute cholecystitis; however she has no fever, normal labs, and not really tender right now. Nonetheless her symptoms have worsened and has decreased PO.   Discussed outpt cholecystectomy vs admit with cholecystectomy.   Pt is afraid bc she hasn't been able to eat due to the pain  I think it is reasonable to admit and offer cholecystectomy NPO p mn IVF Consent Pain/nausea meds  I discussed laparoscopic cholecystectomy with IOC in detail.  The patient was shown diagrams detailing the procedure.  We discussed the risks and benefits of a laparoscopic cholecystectomy including, but not limited to bleeding, infection, injury to surrounding structures such as the intestine or liver, bile leak, retained gallstones, need to convert to an open procedure, prolonged diarrhea, blood clots such as  DVT, common bile duct injury, anesthesia risks, and possible need for additional procedures.  We discussed the typical post-operative recovery course. I explained that the likelihood of improvement of their symptoms is good. I did explain that not of her symptoms may be ameliorated with cholecystectomy  I also did not offer any guarantee of timing of surgery   Leighton Ruff. Redmond Pulling, MD, FACS General, Bariatric, & Minimally Invasive Surgery [facility], [place]    [facility] M 11/23/2014, 7:09 PM

## 2014-11-23 NOTE — Telephone Encounter (Signed)
Pt states after speaking with CMA, pt has a few more questions.please advise

## 2014-11-23 NOTE — Telephone Encounter (Signed)
Relation to pt: self Call back number: [phone]   Reason for call:  Pt inquiring about ultrasound results. Please advise

## 2014-11-23 NOTE — Anesthesia Preprocedure Evaluation (Addendum)
Anesthesia Evaluation  Patient identified by MRN, date of birth, ID band Patient awake    Reviewed: Allergy & Precautions, NPO status , Patient's Chart, lab work & pertinent test results  Airway Mallampati: II  TM Distance: >3 FB Neck ROM: Full    Dental  (+) Partial Upper   Pulmonary Current Smoker,  breath sounds clear to auscultation  Pulmonary exam normal       Cardiovascular hypertension, Pt. on medications Normal cardiovascular examRhythm:Regular Rate:Normal     Neuro/Psych PSYCHIATRIC DISORDERS Anxiety Depression negative neurological ROS     GI/Hepatic negative GI ROS, (+) Hepatitis -, C  Endo/Other  negative endocrine ROS  Renal/GU negative Renal ROS  negative genitourinary   Musculoskeletal negative musculoskeletal ROS (+)   Abdominal (+) + obese,   Peds negative pediatric ROS (+)  Hematology negative hematology ROS (+)   Anesthesia Other Findings   Reproductive/Obstetrics negative OB ROS                            Anesthesia Physical Anesthesia Plan  ASA: II  Anesthesia Plan: General   Post-op Pain Management:    Induction: Intravenous  Airway Management Planned: Oral ETT  Additional Equipment:   Intra-op Plan:   Post-operative Plan: Extubation in OR  Informed Consent: I have reviewed the patients History and Physical, chart, labs and discussed the procedure including the risks, benefits and alternatives for the proposed anesthesia with the patient or authorized representative who has indicated his/her understanding and acceptance.   Dental advisory given  Plan Discussed with: CRNA  Anesthesia Plan Comments:         Anesthesia Quick Evaluation

## 2014-11-23 NOTE — ED Notes (Signed)
Pt reports she had US today and was told by doctor to come here to have gallbladder removed.

## 2014-11-23 NOTE — ED Provider Notes (Signed)
CSN: [id number]     Arrival date & time 11/23/14  1558 History   First MD Initiated Contact with Patient 11/23/14 1645     Chief Complaint  Patient presents with  . Cholelithiasis     HPI Pt was seen at 1700.  Per pt, c/o gradual onset and worsening of persistent upper abd "pain" for the past 1 week. Pt states her abd pain was intermittent, but became constant 3 days ago. Has been associated with nausea. Pt was evaluated by her PMD this past week for same, with labs and outpatient [place]. Pt states she was told to come to the ED today "because my ultrasound showed that I need by gallbladder out." Pt states she was told to come to the ED "to see a surgeon."  Denies vomiting/diarrhea, no fevers, no back pain, no rash, no CP/SOB, no black or blood in stools.       Past Medical History  Diagnosis Date  . Hypertension   . Depression   . Hepatitis C   . Anxiety   . Gall stone   . Chronic knee pain    Past Surgical History  Procedure Laterality Date  . Ectopic pregnancy surgery    . Carpal tunnel release Left    Family History  Problem Relation Age of Onset  . Drug abuse Brother   . Esophageal cancer Brother   . Hypertension Mother   . Diabetes Mother   . Colon cancer Brother   . Hypertension Sister     x's 2  . Diabetes Sister     oldest sister  . Stomach cancer Neg Hx    History  Substance Use Topics  . Smoking status: Current Every Day Smoker -- 1.00 packs/day for 35 years    Types: Cigarettes  . Smokeless tobacco: Never Used  . Alcohol Use: Yes     Comment: "last alcohol was in the 1990's"    Review of Systems ROS: Statement: All systems negative except as marked or noted in the HPI; Constitutional: Negative for fever and chills. ; ; Eyes: Negative for eye pain, redness and discharge. ; ; ENMT: Negative for ear pain, hoarseness, nasal congestion, sinus pressure and sore throat. ; ; Cardiovascular: Negative for chest pain, palpitations, diaphoresis, dyspnea and peripheral  edema. ; ; Respiratory: Negative for cough, wheezing and stridor. ; ; Gastrointestinal: +nausea, abd pain. Negative for vomiting, diarrhea, blood in stool, hematemesis, jaundice and rectal bleeding. . ; ; Genitourinary: Negative for dysuria, flank pain and hematuria. ; ; Musculoskeletal: Negative for back pain and neck pain. Negative for swelling and trauma.; ; Skin: Negative for pruritus, rash, abrasions, blisters, bruising and skin lesion.; ; Neuro: Negative for headache, lightheadedness and neck stiffness. Negative for weakness, altered level of consciousness , altered mental status, extremity weakness, paresthesias, involuntary movement, seizure and syncope.      Allergies  Review of patient's allergies indicates no known allergies.  Home Medications   Prior to Admission medications   Medication Sig Start Date End Date Taking? Authorizing Provider  ALPRAZolam (XANAX) 0.5 MG tablet TAKE 1 TABLET BY MOUTH EVERY DAY AS NEEDED Patient taking differently: TAKE 0.5 MG BY MOUTH DAILY AS NEEDED FOR ANXIETY 11/09/14  Yes Waldon Merl, PA-C  benazepril-hydrochlorthiazide (LOTENSIN HCT) 10-12.5 MG per tablet Take 1 tablet by mouth daily. 07/30/14  Yes Sheliah Hatch, MD  ciprofloxacin (CIPRO) 500 MG tablet Take 1 tablet (500 mg total) by mouth 2 (two) times daily. 11/20/14  Yes Esperanza Richters, PA-C  ibuprofen (ADVIL,MOTRIN) 800 MG tablet TAKE 1 TABLET THREE TIMES A DAY AS NEEDED Patient taking differently: TAKE 800 MG BY MOUTH THREE TIMES DAILY AS NEEDED FOR PAIN 09/03/14  Yes Sheliah Hatch, MD  SUBOXONE 8-2 MG FILM Place 0.5 strips under the tongue 2 (two) times daily. 04/18/13  Yes Historical Provider, MD  venlafaxine XR (EFFEXOR-XR) 150 MG 24 hr capsule TAKE 1 CAPSULE (150 MG TOTAL) BY MOUTH DAILY WITH BREAKFAST. 10/05/14  Yes Sheliah Hatch, MD  albuterol (PROVENTIL HFA;VENTOLIN HFA) 108 (90 BASE) MCG/ACT inhaler Inhale 2 puffs into the lungs every 6 (six) hours as needed for wheezing or  shortness of breath. Patient not taking: Reported on 11/23/2014 04/06/14   Carollee Leitz, NP  ranitidine (ZANTAC) 150 MG capsule Take 1 capsule (150 mg total) by mouth 2 (two) times daily. Patient not taking: Reported on 11/23/2014 11/20/14   Esperanza Richters, PA-C  Vitamin D, Ergocalciferol, (DRISDOL) 50000 UNITS CAPS capsule Take 1 capsule (50,000 Units total) by mouth every 7 (seven) days. Patient not taking: Reported on 11/23/2014 07/17/14   Sheliah Hatch, MD   BP 142/83 mmHg  Pulse 97  Temp(Src) 98.1 F (36.7 C) (Oral)  Resp 18  SpO2 96% Physical Exam  1705: Physical examination:  Nursing notes reviewed; Vital signs and O2 SAT reviewed;  Constitutional: Well developed, Well nourished, Well hydrated, In no acute distress; Head:  Normocephalic, atraumatic; Eyes: EOMI, PERRL, No scleral icterus; ENMT: Mouth and pharynx normal, Mucous membranes moist; Neck: Supple, Full range of motion, No lymphadenopathy; Cardiovascular: Regular rate and rhythm, No murmur, rub, or gallop; Respiratory: Breath sounds clear & equal bilaterally, No rales, rhonchi, wheezes.  Speaking full sentences with ease, Normal respiratory effort/excursion; Chest: Nontender, Movement normal; Abdomen: Soft, Nontender, Nondistended, Normal bowel sounds; Genitourinary: No CVA tenderness; Extremities: Pulses normal, No tenderness, No edema, No calf edema or asymmetry.; Neuro: AA&Ox3, Major CN grossly intact.  Speech clear. No gross focal motor or sensory deficits in extremities.; Skin: Color normal, Warm, Dry.   ED Course  Procedures       EKG Interpretation None      MDM  MDM Reviewed: previous chart, nursing note and vitals Reviewed previous: labs and ultrasound Interpretation: labs     US Abdomen Complete 11/23/2014   CLINICAL DATA:  Worsening upper abdominal pain extending into the back with nausea for 6 days. History of cholelithiasis, hypertension and hepatitis-C. Initial encounter.  EXAM: ULTRASOUND ABDOMEN  COMPLETE  COMPARISON:  Ultrasound 06/21/2014.  FINDINGS: Gallbladder: The gallbladder is filled with multiple shadowing gallstones. There is mildly progressive wall thickening to 4 mm. Sonographic Eulah Pont sign is reported as positive.  Common bile duct: Diameter: 3 mm.  Liver: Mildly increased echogenicity without focal abnormality.  IVC: No abnormality visualized.  Pancreas: Partially obscured by bowel gas and not optimally visualized.  Spleen: Size and appearance within normal limits.  Right Kidney: Length: 9.9 cm. Echogenicity within normal limits. No mass or hydronephrosis visualized.  Left Kidney: Length: 9.7 cm. Echogenicity within normal limits. No mass or hydronephrosis visualized.  Abdominal aorta: Partially obscured by bowel gas. No abnormality identified.  Other findings: None.  IMPRESSION: 1. Persistent abnormal appearance of the gallbladder with multiple gallstones and gallbladder wall thickening. The wall thickening has progressed compared with the prior study and the sonographic Eulah Pont sign is now reported as positive. These findings are suspicious for acute on chronic cholecystitis. 2. No other significant findings.   Electronically Signed   By: Hilarie Fredrickson.D.  On: 11/23/2014 09:02     Results for orders placed or performed during the hospital encounter of 11/23/14  CBC with Differential  Result Value Ref Range   WBC 8.5 4.0 - 10.5 K/uL   RBC 4.75 3.87 - 5.11 MIL/uL   Hemoglobin 14.2 12.0 - 15.0 g/dL   HCT 16.142.6 09.636.0 - 04.546.0 %   MCV 89.7 78.0 - 100.0 fL   MCH 29.9 26.0 - 34.0 pg   MCHC 33.3 30.0 - 36.0 g/dL   RDW 40.913.9 81.111.5 - 91.415.5 %   Platelets 143 (L) 150 - 400 K/uL   Neutrophils Relative % 55 43 - 77 %   Neutro Abs 4.7 1.7 - 7.7 K/uL   Lymphocytes Relative 38 12 - 46 %   Lymphs Abs 3.2 0.7 - 4.0 K/uL   Monocytes Relative 6 3 - 12 %   Monocytes Absolute 0.5 0.1 - 1.0 K/uL   Eosinophils Relative 1 0 - 5 %   Eosinophils Absolute 0.1 0.0 - 0.7 K/uL   Basophils Relative 0 0 -  1 %   Basophils Absolute 0.0 0.0 - 0.1 K/uL  Comprehensive metabolic panel  Result Value Ref Range   Sodium 138 135 - 145 mmol/L   Potassium 4.4 3.5 - 5.1 mmol/L   Chloride 100 (L) 101 - 111 mmol/L   CO2 28 22 - 32 mmol/L   Glucose, Bld 108 (H) 65 - 99 mg/dL   BUN 14 6 - 20 mg/dL   Creatinine, Ser 7.820.73 0.44 - 1.00 mg/dL   Calcium 8.9 8.9 - 95.610.3 mg/dL   Total Protein 8.1 6.5 - 8.1 g/dL   Albumin 3.7 3.5 - 5.0 g/dL   AST 28 15 - 41 U/L   ALT 30 14 - 54 U/L   Alkaline Phosphatase 55 38 - 126 U/L   Total Bilirubin 0.2 (L) 0.3 - 1.2 mg/dL   GFR calc non Af Amer >60 >60 mL/min   GFR calc Af Amer >60 >60 mL/min   Anion gap 10 5 - 15  Lipase, blood  Result Value Ref Range   Lipase 14 (L) 22 - 51 U/L  Urinalysis, Routine w reflex microscopic (not [facility])  Result Value Ref Range   Color, Urine YELLOW YELLOW   APPearance CLOUDY (A) CLEAR   Specific Gravity, Urine 1.010 1.005 - 1.030   pH 5.0 5.0 - 8.0   Glucose, UA NEGATIVE NEGATIVE mg/dL   Hgb urine dipstick NEGATIVE NEGATIVE   Bilirubin Urine NEGATIVE NEGATIVE   Ketones, ur NEGATIVE NEGATIVE mg/dL   Protein, ur NEGATIVE NEGATIVE mg/dL   Urobilinogen, UA 0.2 0.0 - 1.0 mg/dL   Nitrite NEGATIVE NEGATIVE   Leukocytes, UA NEGATIVE NEGATIVE    1800:  Labs reassuring. US suspicious for acute on chronic cholecystitis. Pt remains afebrile with stable VS. Dx and testing d/w pt.  Questions answered.  Verb understanding, agreeable to General Surgery consult in the ED.  T/C to General Surgery Dr. Andrey CampanileWilson, case discussed, including:  HPI, pertinent PM/SHx, VS/PE, dx testing, ED course and treatment:  Agreeable to evaluate pt in the ED.   Samuel JesterKathleen Halena Mohar, DO 11/26/14 1733

## 2014-11-24 ENCOUNTER — Encounter (HOSPITAL_COMMUNITY)
Admission: EM | Disposition: A | Discharge status: Home / Self Care | Payer: Self-pay | Source: Home / Self Care | Attending: Emergency Medicine

## 2014-11-24 ENCOUNTER — Encounter (HOSPITAL_COMMUNITY): Payer: Self-pay | Admitting: Anesthesiology

## 2014-11-24 ENCOUNTER — Observation Stay (HOSPITAL_COMMUNITY): Payer: BLUE CROSS/BLUE SHIELD | Admitting: Anesthesiology

## 2014-11-24 ENCOUNTER — Observation Stay (HOSPITAL_COMMUNITY): Payer: BLUE CROSS/BLUE SHIELD

## 2014-11-24 HISTORY — PX: CHOLECYSTECTOMY: SHX55

## 2014-11-24 LAB — COMPREHENSIVE METABOLIC PANEL
ALT: 25 U/L (ref 14–54)
ANION GAP: 9 (ref 5–15)
AST: 28 U/L (ref 15–41)
Albumin: 3 g/dL — ABNORMAL LOW (ref 3.5–5.0)
Alkaline Phosphatase: 47 U/L (ref 38–126)
BUN: 11 mg/dL (ref 6–20)
CO2: 26 mmol/L (ref 22–32)
Calcium: 8.1 mg/dL — ABNORMAL LOW (ref 8.9–10.3)
Chloride: 104 mmol/L (ref 101–111)
Creatinine, Ser: 0.73 mg/dL (ref 0.44–1.00)
GFR calc Af Amer: >60 mL/min (ref >60)
GFR calc non Af Amer: >60 mL/min (ref >60)
GLUCOSE: 121 mg/dL — AB (ref 65–99)
POTASSIUM: 4.1 mmol/L (ref 3.5–5.1)
Sodium: 139 mmol/L (ref 135–145)
Total Bilirubin: 0.6 mg/dL (ref 0.3–1.2)
Total Protein: 6.7 g/dL (ref 6.5–8.1)

## 2014-11-24 LAB — SURGICAL PCR SCREEN
MRSA, PCR: NEGATIVE
Staphylococcus aureus: NEGATIVE

## 2014-11-24 LAB — PROTIME-INR
INR: 1.08 (ref 0.00–1.49)
PROTHROMBIN TIME: 14.2 s (ref 11.6–15.2)

## 2014-11-24 SURGERY — LAPAROSCOPIC CHOLECYSTECTOMY WITH INTRAOPERATIVE CHOLANGIOGRAM
Anesthesia: General

## 2014-11-24 MED ORDER — IOHEXOL 300 MG/ML  SOLN
INTRAMUSCULAR | Status: DC | PRN
  Administered 2014-11-24: 5 mL

## 2014-11-24 MED ORDER — OXYCODONE-ACETAMINOPHEN 5-325 MG PO TABS
1.0000 | ORAL_TABLET | ORAL | Status: DC | PRN
  Filled 2014-11-24 (×2): qty 1
  Filled 2014-11-24: qty 2

## 2014-11-24 MED ORDER — LACTATED RINGERS IV SOLN
INTRAVENOUS | Status: DC | PRN
  Administered 2014-11-24: 11:00:00 via INTRAVENOUS

## 2014-11-24 MED ORDER — FENTANYL CITRATE (PF) 250 MCG/5ML IJ SOLN
INTRAMUSCULAR | Status: AC
  Filled 2014-11-24: qty 5

## 2014-11-24 MED ORDER — PROPOFOL 10 MG/ML IV BOLUS
INTRAVENOUS | Status: DC | PRN
  Administered 2014-11-24: 40 mg via INTRAVENOUS
  Administered 2014-11-24: 150 mg via INTRAVENOUS

## 2014-11-24 MED ORDER — MIDAZOLAM HCL 5 MG/5ML IJ SOLN
INTRAMUSCULAR | Status: DC | PRN
  Administered 2014-11-24 (×2): 1 mg via INTRAVENOUS

## 2014-11-24 MED ORDER — ROCURONIUM BROMIDE 100 MG/10ML IV SOLN
INTRAVENOUS | Status: DC | PRN
  Administered 2014-11-24: 40 mg via INTRAVENOUS
  Administered 2014-11-24: 5 mg via INTRAVENOUS

## 2014-11-24 MED ORDER — FENTANYL CITRATE (PF) 100 MCG/2ML IJ SOLN
INTRAMUSCULAR | Status: DC | PRN
  Administered 2014-11-24: 100 ug via INTRAVENOUS
  Administered 2014-11-24: 50 ug via INTRAVENOUS
  Administered 2014-11-24: 100 ug via INTRAVENOUS

## 2014-11-24 MED ORDER — PROMETHAZINE HCL 25 MG/ML IJ SOLN
12.5000 mg | Freq: Four times a day (QID) | INTRAMUSCULAR | Status: DC | PRN
  Administered 2014-11-24: 12.5 mg via INTRAVENOUS

## 2014-11-24 MED ORDER — BUPIVACAINE-EPINEPHRINE (PF) 0.25% -1:200000 IJ SOLN
INTRAMUSCULAR | Status: AC
  Filled 2014-11-24: qty 30

## 2014-11-24 MED ORDER — GLYCOPYRROLATE 0.2 MG/ML IJ SOLN
INTRAMUSCULAR | Status: DC | PRN
  Administered 2014-11-24: 0.4 mg via INTRAVENOUS

## 2014-11-24 MED ORDER — MIDAZOLAM HCL 2 MG/2ML IJ SOLN
INTRAMUSCULAR | Status: AC
  Filled 2014-11-24: qty 2

## 2014-11-24 MED ORDER — PROPOFOL 10 MG/ML IV BOLUS
INTRAVENOUS | Status: AC
  Filled 2014-11-24: qty 20

## 2014-11-24 MED ORDER — NEOSTIGMINE METHYLSULFATE 10 MG/10ML IV SOLN
INTRAVENOUS | Status: DC | PRN
  Administered 2014-11-24: 3.5 mg via INTRAVENOUS

## 2014-11-24 MED ORDER — LABETALOL HCL 5 MG/ML IV SOLN
INTRAVENOUS | Status: DC | PRN
  Administered 2014-11-24: 5 mg via INTRAVENOUS

## 2014-11-24 MED ORDER — LABETALOL HCL 5 MG/ML IV SOLN
INTRAVENOUS | Status: AC
  Filled 2014-11-24: qty 4

## 2014-11-24 MED ORDER — PROMETHAZINE HCL 25 MG/ML IJ SOLN
INTRAMUSCULAR | Status: AC
  Filled 2014-11-24: qty 1

## 2014-11-24 MED ORDER — SODIUM CHLORIDE 0.9 % IJ SOLN
INTRAMUSCULAR | Status: AC
  Filled 2014-11-24: qty 10

## 2014-11-24 MED ORDER — DEXTROSE 5 % IV SOLN
INTRAVENOUS | Status: AC
  Filled 2014-11-24: qty 2

## 2014-11-24 MED ORDER — ONDANSETRON HCL 4 MG/2ML IJ SOLN
INTRAMUSCULAR | Status: AC
  Filled 2014-11-24: qty 2

## 2014-11-24 MED ORDER — BUPIVACAINE-EPINEPHRINE 0.25% -1:200000 IJ SOLN
INTRAMUSCULAR | Status: DC | PRN
  Administered 2014-11-24: 30 mL

## 2014-11-24 MED ORDER — LIDOCAINE HCL (CARDIAC) 20 MG/ML IV SOLN
INTRAVENOUS | Status: AC
Start: 2014-11-24 — End: 2014-11-24
  Filled 2014-11-24: qty 5

## 2014-11-24 MED ORDER — LIDOCAINE HCL (CARDIAC) 20 MG/ML IV SOLN
INTRAVENOUS | Status: DC | PRN
  Administered 2014-11-24: 60 mg via INTRAVENOUS

## 2014-11-24 MED ORDER — HYDROMORPHONE HCL 1 MG/ML IJ SOLN
0.2500 mg | INTRAMUSCULAR | Status: DC | PRN
  Administered 2014-11-24 (×2): 0.5 mg via INTRAVENOUS

## 2014-11-24 MED ORDER — HEPARIN SODIUM (PORCINE) 5000 UNIT/ML IJ SOLN
5000.0000 [IU] | Freq: Three times a day (TID) | INTRAMUSCULAR | Status: DC
  Administered 2014-11-24 – 2014-11-25 (×2): 5000 [IU] via SUBCUTANEOUS
  Filled 2014-11-24 (×5): qty 1

## 2014-11-24 MED ORDER — LACTATED RINGERS IR SOLN
Status: DC | PRN
  Administered 2014-11-24: 1000 mL

## 2014-11-24 MED ORDER — HYDROMORPHONE HCL 1 MG/ML IJ SOLN
INTRAMUSCULAR | Status: AC
  Filled 2014-11-24: qty 1

## 2014-11-24 MED ORDER — DEXAMETHASONE SODIUM PHOSPHATE 10 MG/ML IJ SOLN
INTRAMUSCULAR | Status: DC | PRN
  Administered 2014-11-24: 10 mg via INTRAVENOUS

## 2014-11-24 MED ORDER — MORPHINE SULFATE 2 MG/ML IJ SOLN
1.0000 mg | INTRAMUSCULAR | Status: DC | PRN
  Administered 2014-11-24 (×2): 2 mg via INTRAVENOUS
  Filled 2014-11-24 (×2): qty 1

## 2014-11-24 SURGICAL SUPPLY — 40 items
APPLIER CLIP 5 13 M/L LIGAMAX5 (MISCELLANEOUS) ×2
APPLIER CLIP ROT 10 11.4 M/L (STAPLE)
APR CLP MED LRG 11.4X10 (STAPLE)
APR CLP MED LRG 5 ANG JAW (MISCELLANEOUS) ×1
BAG SPEC RTRVL 10 TROC 200 (ENDOMECHANICALS) ×1
BAG SPEC RTRVL LRG 6X4 10 (ENDOMECHANICALS) ×1
CABLE HIGH FREQUENCY MONO STRZ (ELECTRODE) ×1 IMPLANT
CHLORAPREP W/TINT 26ML (MISCELLANEOUS) ×2 IMPLANT
CLIP APPLIE 5 13 M/L LIGAMAX5 (MISCELLANEOUS) ×1 IMPLANT
CLIP APPLIE ROT 10 11.4 M/L (STAPLE) IMPLANT
COVER MAYO STAND STRL (DRAPES) ×1 IMPLANT
DECANTER SPIKE VIAL GLASS SM (MISCELLANEOUS) ×1 IMPLANT
DRAPE C-ARM 42X120 X-RAY (DRAPES) ×1 IMPLANT
DRAPE LAPAROSCOPIC ABDOMINAL (DRAPES) ×2 IMPLANT
ELECT REM PT RETURN 9FT ADLT (ELECTROSURGICAL) ×2
ELECTRODE REM PT RTRN 9FT ADLT (ELECTROSURGICAL) ×1 IMPLANT
GLOVE BIOGEL M STRL SZ7.5 (GLOVE) ×2 IMPLANT
GLOVE BIOGEL PI IND STRL 7.0 (GLOVE) ×1 IMPLANT
GLOVE BIOGEL PI INDICATOR 7.0 (GLOVE) ×6
GOWN STRL REUS W/TWL XL LVL3 (GOWN DISPOSABLE) ×7 IMPLANT
HEMOSTAT SNOW SURGICEL 2X4 (HEMOSTASIS) ×1 IMPLANT
KIT BASIN OR (CUSTOM PROCEDURE TRAY) ×2 IMPLANT
LIQUID BAND (GAUZE/BANDAGES/DRESSINGS) ×2 IMPLANT
NS IRRIG 1000ML POUR BTL (IV SOLUTION) ×2 IMPLANT
PEN SKIN MARKING BROAD (MISCELLANEOUS) ×2 IMPLANT
POUCH RETRIEVAL ECOSAC 10 (ENDOMECHANICALS) IMPLANT
POUCH RETRIEVAL ECOSAC 10MM (ENDOMECHANICALS) ×1
POUCH SPECIMEN RETRIEVAL 10MM (ENDOMECHANICALS) ×2 IMPLANT
SCISSORS LAP 5X35 DISP (ENDOMECHANICALS) ×2 IMPLANT
SET CHOLANGIOGRAPH MIX (MISCELLANEOUS) ×1 IMPLANT
SET IRRIG TUBING LAPAROSCOPIC (IRRIGATION / IRRIGATOR) ×2 IMPLANT
SLEEVE XCEL OPT CAN 5 100 (ENDOMECHANICALS) ×4 IMPLANT
SUT MNCRL AB 4-0 PS2 18 (SUTURE) ×2 IMPLANT
SUT VICRYL 0 UR6 27IN ABS (SUTURE) IMPLANT
TOWEL OR 17X26 10 PK STRL BLUE (TOWEL DISPOSABLE) ×2 IMPLANT
TOWEL OR NON WOVEN STRL DISP B (DISPOSABLE) ×2 IMPLANT
TRAY LAPAROSCOPIC (CUSTOM PROCEDURE TRAY) ×2 IMPLANT
TROCAR BLADELESS OPT 5 100 (ENDOMECHANICALS) ×2 IMPLANT
TROCAR XCEL BLUNT TIP 100MML (ENDOMECHANICALS) ×2 IMPLANT
TROCAR XCEL NON-BLD 11X100MML (ENDOMECHANICALS) ×1 IMPLANT

## 2014-11-24 NOTE — Transfer of Care (Signed)
Immediate Anesthesia Transfer of Care Note  Patient: Vanessa HakimSusie [name]  Procedure(s) Performed: Procedure(s): LAPAROSCOPIC CHOLECYSTECTOMY WITH INTRAOPERATIVE CHOLANGIOGRAM (N/A)  Patient Location: PACU  Anesthesia Type:General  Level of Consciousness: awake, alert  and oriented  Airway & Oxygen Therapy: Patient Spontanous Breathing and Patient connected to face mask oxygen  Post-op Assessment: Report given to RN and Post -op Vital signs reviewed and stable  Post vital signs: Reviewed and stable  Last Vitals:  Filed Vitals:   11/24/14 0942  BP: 107/59  Pulse: 71  Temp: 36.8 C  Resp: 18    Complications: No apparent anesthesia complications

## 2014-11-24 NOTE — Anesthesia Procedure Notes (Signed)
Procedure Name: Intubation Date/Time: 11/24/2014 11:27 AM Performed by: Thornell MuleSTUBBLEFIELD, Bela Nyborg G Pre-anesthesia Checklist: Patient identified, Emergency Drugs available, Suction available and Patient being monitored Patient Re-evaluated:Patient Re-evaluated prior to inductionOxygen Delivery Method: Circle System Utilized Preoxygenation: Pre-oxygenation with 100% oxygen Intubation Type: IV induction Ventilation: Mask ventilation without difficulty Laryngoscope Size: Miller and 3 Grade View: Grade I Tube type: Oral Tube size: 7.0 mm Number of attempts: 1 Airway Equipment and Method: Stylet and Oral airway Placement Confirmation: ETT inserted through vocal cords under direct vision,  positive ETCO2 and breath sounds checked- equal and bilateral Secured at: 20 cm Tube secured with: Tape Dental Injury: Teeth and Oropharynx as per pre-operative assessment

## 2014-11-24 NOTE — Interval H&P Note (Signed)
History and Physical Interval Note:  11/24/2014 11:14 AM  Vanessa [name]  has presented today for surgery, with the diagnosis of Cholecystitis  The various methods of treatment have been discussed with the patient and family. After consideration of risks, benefits and other options for treatment, the patient has consented to  Procedure(s): LAPAROSCOPIC CHOLECYSTECTOMY WITH INTRAOPERATIVE CHOLANGIOGRAM (N/A) as a surgical intervention .  The patient's history has been reviewed, patient examined, no change in status, stable for surgery.  I have reviewed the patient's chart and labs.  Questions were answered to the patient's satisfaction.    Mary SellaEric M. Andrey CampanileWilson, MD, FACS General, Bariatric, & Minimally Invasive Surgery [facility], [place]   [facility] CountyWILSON,[name]

## 2014-11-24 NOTE — Op Note (Signed)
Vanessa [name] [id number] [birth year] 11/24/2014  Laparoscopic Cholecystectomy with IOC Procedure Note  Indications: This patient presents with symptomatic gallbladder disease and will undergo laparoscopic cholecystectomy.  Pre-operative Diagnosis: Calculus of gallbladder with other cholecystitis, without mention of obstruction  Post-operative Diagnosis: Same  Surgeon: Atilano Ina   Assistants: Dr Wenda Low  Anesthesia: General endotracheal anesthesia  ASA Class: 2  Procedure Details  The patient was seen again in the Holding Room. The risks, benefits, complications, treatment options, and expected outcomes were discussed with the patient. The possibilities of reaction to medication, pulmonary aspiration, perforation of viscus, bleeding, recurrent infection, finding a normal gallbladder, the need for additional procedures, failure to diagnose a condition, the possible need to convert to an open procedure, and creating a complication requiring transfusion or operation were discussed with the patient. The likelihood of improving the patient's symptoms with return to their baseline status is good.  The patient and/or family concurred with the proposed plan, giving informed consent. The site of surgery properly noted. The patient was taken to Operating Room, identified as Vanessa [name] and the procedure verified as Laparoscopic Cholecystectomy with Intraoperative Cholangiogram. A Time Out was held and the above information confirmed. Antibiotic prophylaxis was administered.   Prior to the induction of general anesthesia, antibiotic prophylaxis was administered. General endotracheal anesthesia was then administered and tolerated well. After the induction, the abdomen was prepped with Chloraprep and draped in the sterile fashion. The patient was positioned in the supine position.  Local anesthetic agent was injected into the skin near the umbilicus and an incision made. We dissected down to the  abdominal fascia with blunt dissection.  The fascia was incised vertically and we entered the peritoneal cavity bluntly.  A pursestring suture of 0-Vicryl was placed around the fascial opening.  The Hasson cannula was inserted and secured with the stay suture.  Pneumoperitoneum was then created with CO2 and tolerated well without any adverse changes in the patient's vital signs. An 5-mm port was placed in the subxiphoid position.  Two 5-mm ports were placed in the right upper quadrant. All skin incisions were infiltrated with a local anesthetic agent before making the incision and placing the trocars.   We positioned the patient in reverse Trendelenburg, tilted slightly to the patient's left.  The gallbladder was identified, the fundus grasped and retracted cephalad. There was edema in the gallbladder wall. Adhesions were lysed bluntly and with the electrocautery where indicated, taking care not to injure any adjacent organs or viscus. The infundibulum was grasped and retracted laterally, exposing the peritoneum overlying the triangle of Calot. This was then divided and exposed in a blunt fashion. A critical view of the cystic duct and cystic artery was obtained.  The cystic duct was clearly identified and bluntly dissected circumferentially. The common bile duct was visualized as well. I decided to go ahead and take the cystic artery. 2 clips were placed on the downside of the cystic artery and one as it entered the gallbladder it was then transected. The cystic duct was ligated with a clip distally.   An incision was made in the cystic duct and the [facility] cholangiogram catheter introduced. The catheter was secured using a clip. A cholangiogram was then obtained which showed good visualization of the distal and proximal biliary tree with no sign of filling defects or obstruction.  Contrast flowed easily into the duodenum. The catheter was then removed.   The cystic duct was then ligated with clips and divided.     The  gallbladder was dissected from the liver bed in retrograde fashion with the electrocautery. There was some spillage of bile and several gallstones from the dome of the gallbladder. I changed the epigastric trocar to an 11 mm trocar and extracted the stones that had spilled. The gallbladder was removed and placed in an Ecco sac.  The gallbladder and Ecco sac were then removed through the umbilical port site. The liver bed was irrigated and inspected. Hemostasis was achieved with the electrocautery. Copious irrigation was utilized and was repeatedly aspirated until clear. There is no evidence of spilled gallstones. The pursestring suture was used to close the umbilical fascia.  I placed an additional figure-of-eight 0 Vicryl suture at the umbilical fascia  We again inspected the right upper quadrant for hemostasis.  The umbilical closure was inspected and there was no air leak and nothing trapped within the closure. Pneumoperitoneum was released as we removed the trocars.  4-0 Monocryl was used to close the skin.   Benzoin, steri-strips, and clean dressings were applied. The patient was then extubated and brought to the recovery room in stable condition. Instrument, sponge, and needle counts were correct at closure and at the conclusion of the case.   Findings: Cholecystitis with Cholelithiasis  Estimated Blood Loss: Minimal         Drains: none         Specimens: Gallbladder           Complications: None; patient tolerated the procedure well.         Disposition: PACU - hemodynamically stable.         Condition: stable  Mary SellaEric M. Andrey CampanileWilson, MD, FACS General, Bariatric, & Minimally Invasive Surgery [facility], [place]

## 2014-11-24 NOTE — Progress Notes (Signed)
Partial plate (teeth ) given to pt

## 2014-11-25 DIAGNOSIS — Z9049 Acquired absence of other specified parts of digestive tract: Secondary | ICD-10-CM

## 2014-11-25 NOTE — Discharge Summary (Signed)
Physician Discharge Summary  Patient ID: Vanessa HakimSusie Augusta MRN: [id number] DOB/AGE: 53-Oct-1962 53 y.o.  Admit date: 11/23/2014 Discharge date: 11/25/2014  Admission Diagnoses:  cholecystitis  Discharge Diagnoses:  same  Active Problems:   S/P laparoscopic cholecystectomy   Surgery:  Laparoscopic cholecystectomy with IOC  Discharged Condition: improved  Hospital Course:   Patient had lap chole on Sat.  She stopped her pain meds and was wanting to be discharged and the only way home was to drive (she drove herself here).  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   Consults: none  Significant Diagnostic Studies: IOC    Discharge Exam: Blood pressure 122/76, pulse 86, temperature 98.2 F (36.8 C), temperature source Oral, resp. rate 18, height 5\' 1"  (1.549 m), weight 80.967 kg (178 lb 8 oz), SpO2 90 %. Incisions OK.    Disposition: 01-Home or Self Care  Discharge Instructions    Diet - low sodium heart healthy    Complete by:  As directed      Discharge instructions    Complete by:  As directed   May shower and shampoo     Increase activity slowly    Complete by:  As directed      No wound care    Complete by:  As directed             Medication List    TAKE these medications        albuterol 108 (90 BASE) MCG/ACT inhaler  Commonly known as:  PROVENTIL HFA;VENTOLIN HFA  Inhale 2 puffs into the lungs every 6 (six) hours as needed for wheezing or shortness of breath.     ALPRAZolam 0.5 MG tablet  Commonly known as:  XANAX  TAKE 1 TABLET BY MOUTH EVERY DAY AS NEEDED     benazepril-hydrochlorthiazide 10-12.5 MG per tablet  Commonly known as:  LOTENSIN HCT  Take 1 tablet by mouth daily.     ciprofloxacin 500 MG tablet  Commonly known as:  CIPRO  Take 1 tablet (500 mg total) by mouth 2 (two) times daily.     ibuprofen 800 MG tablet  Commonly known as:  ADVIL,MOTRIN  TAKE 1 TABLET THREE TIMES A DAY AS NEEDED     ranitidine 150 MG capsule   Commonly known as:  ZANTAC  Take 1 capsule (150 mg total) by mouth 2 (two) times daily.     SUBOXONE 8-2 MG Film  Generic drug:  Buprenorphine HCl-Naloxone HCl  Place 0.5 strips under the tongue 2 (two) times daily.     venlafaxine XR 150 MG 24 hr capsule  Commonly known as:  EFFEXOR-XR  TAKE 1 CAPSULE (150 MG TOTAL) BY MOUTH DAILY WITH BREAKFAST.     Vitamin D (Ergocalciferol) 50000 UNITS Caps capsule  Commonly known as:  DRISDOL  Take 1 capsule (50,000 Units total) by mouth every 7 (seven) days.           Follow-up Information    Follow up with Atilano InaWILSON,ERIC M, MD. Schedule an appointment as soon as possible for a visit in 3 weeks.   Specialty:  General Surgery   Contact information:   [address] [place] [address] [phone]       Signed: Valarie MerinoMARTIN,Jhaden Pizzuto B 11/25/2014, 10:14 AM

## 2014-11-25 NOTE — Anesthesia Postprocedure Evaluation (Signed)
  Anesthesia Post-op Note  Patient: Vanessa [name]  Procedure(s) Performed: Procedure(s) (LRB): LAPAROSCOPIC CHOLECYSTECTOMY WITH INTRAOPERATIVE CHOLANGIOGRAM (N/A)  Patient Location: PACU  Anesthesia Type: General  Level of Consciousness: awake and alert   Airway and Oxygen Therapy: Patient Spontanous Breathing  Post-op Pain: mild  Post-op Assessment: Post-op Vital signs reviewed, Patient's Cardiovascular Status Stable, Respiratory Function Stable, Patent Airway and No signs of Nausea or vomiting  Last Vitals:  Filed Vitals:   11/25/14 0200  BP: 138/82  Pulse: 95  Temp: 36.9 C  Resp: 18    Post-op Vital Signs: stable   Complications: No apparent anesthesia complications

## 2014-11-25 NOTE — Progress Notes (Signed)
Encourage pt to ambulate in halls last evening post op, but she refused. She states she had "walked enough going to the bathroom."

## 2014-11-27 ENCOUNTER — Encounter (HOSPITAL_COMMUNITY): Payer: Self-pay | Admitting: General Surgery

## 2014-12-10 ENCOUNTER — Other Ambulatory Visit: Payer: Self-pay | Admitting: Family Medicine

## 2014-12-10 NOTE — Telephone Encounter (Signed)
Med filled and faxed.  

## 2014-12-10 NOTE — Telephone Encounter (Signed)
Last OV 07-17-14 Alprazolam last filled 11-09-14 #30 with 0  CSC, UDS given no notation as to risk

## 2015-01-03 ENCOUNTER — Other Ambulatory Visit: Payer: Self-pay | Admitting: Family Medicine

## 2015-01-04 NOTE — Telephone Encounter (Signed)
Medication filled to pharmacy as requested.   

## 2015-01-04 NOTE — Telephone Encounter (Signed)
Last OV 07/17/14 Alprazolam last filled 12/10/14 #30 with 0

## 2015-01-15 ENCOUNTER — Ambulatory Visit: Payer: BLUE CROSS/BLUE SHIELD | Admitting: Family Medicine

## 2015-01-15 DIAGNOSIS — Z0289 Encounter for other administrative examinations: Secondary | ICD-10-CM

## 2015-01-16 ENCOUNTER — Telehealth: Payer: Self-pay | Admitting: Family Medicine

## 2015-01-16 ENCOUNTER — Encounter: Payer: Self-pay | Admitting: Family Medicine

## 2015-01-16 NOTE — Telephone Encounter (Signed)
Pt was no show 01/15/15 8:30am, 6 month follow up, tried calling but cannot leave msg, mailing letter, charge for no show?

## 2015-01-16 NOTE — Telephone Encounter (Signed)
Yes charge for no show 

## 2015-02-01 ENCOUNTER — Ambulatory Visit (INDEPENDENT_AMBULATORY_CARE_PROVIDER_SITE_OTHER): Payer: BLUE CROSS/BLUE SHIELD | Admitting: Family Medicine

## 2015-02-01 ENCOUNTER — Encounter: Payer: Self-pay | Admitting: General Practice

## 2015-02-01 ENCOUNTER — Encounter: Payer: Self-pay | Admitting: Family Medicine

## 2015-02-01 VITALS — BP 144/74 | HR 75 | Temp 97.9°F | Resp 16 | Wt 177.2 lb

## 2015-02-01 DIAGNOSIS — H16009 Unspecified corneal ulcer, unspecified eye: Secondary | ICD-10-CM | POA: Insufficient documentation

## 2015-02-01 DIAGNOSIS — H44009 Unspecified purulent endophthalmitis, unspecified eye: Secondary | ICD-10-CM

## 2015-02-01 DIAGNOSIS — H209 Unspecified iridocyclitis: Secondary | ICD-10-CM | POA: Diagnosis not present

## 2015-02-01 LAB — SEDIMENTATION RATE: Sed Rate: 9 mm/hr (ref 0–30)

## 2015-02-01 MED ORDER — METHYLPREDNISOLONE ACETATE 80 MG/ML IJ SUSP
80.0000 mg | Freq: Once | INTRAMUSCULAR | Status: AC
  Administered 2015-02-01: 80 mg via INTRAMUSCULAR

## 2015-02-01 NOTE — Patient Instructions (Signed)
Follow up in 3-4 weeks to recheck BP We'll notify you of your lab results and determine the next steps If you want to join [place] at the new [place] office, any scheduled appointments will automatically transfer and we will see you at [address] [place] [address] [place], [place], [place] [address]  Call with any questions or concerns Sherri Rad in there!

## 2015-02-01 NOTE — Progress Notes (Signed)
Pre visit review using our clinic review tool, if applicable. No additional management support is needed unless otherwise documented below in the visit note. 

## 2015-02-01 NOTE — Progress Notes (Signed)
   Subjective:    Patient ID: Vanessa [name], female    DOB: [birth year], 54 y.o.   MRN: [id number]  HPI Eye ulcers- pt is seeing eye doctor and they started her on Doxy and prednisone but pt reports the prednisone is 'making me sick'- 'hot flashes, weak, nausea'.  Pt is supposed to take  prednisone daily x1 month.  Pt called Dr Dione Booze and was told to wean off meds by taking 2 yesterday and 2 today.  Pt just started meds on Tuesday.  When questioned, pt states it was Uveitis (she thinks).     Review of Systems For ROS see HPI     Objective:   Physical Exam  Constitutional: She appears well-developed and well-nourished. No distress.  HENT:  Head: Normocephalic and atraumatic.  Right Ear: Tympanic membrane normal.  Left Ear: Tympanic membrane normal.  Nose: Mucosal edema and rhinorrhea present. Right sinus exhibits no maxillary sinus tenderness and no frontal sinus tenderness. Left sinus exhibits no maxillary sinus tenderness and no frontal sinus tenderness.  Mouth/Throat: Mucous membranes are normal. Posterior oropharyngeal erythema (w/ PND) present.  Eyes: EOM are normal. Pupils are equal, round, and reactive to light.  L corneal ulcer  Neck: Normal range of motion. Neck supple.  Cardiovascular: Normal rate, regular rhythm and normal heart sounds.   Pulmonary/Chest: Effort normal and breath sounds normal. No respiratory distress. She has no wheezes. She has no rales.  Lymphadenopathy:    She has no cervical adenopathy.  Vitals reviewed.         Assessment & Plan:

## 2015-02-02 NOTE — Assessment & Plan Note (Signed)
New.  Unclear if pt has uveitis as she reports or corneal ulcer.  Since she was supposed to be on  prednisone daily x1 month, ophthalmology must suspect systemic inflammation/autoimmune process.  Will check labs to assess for underlying condition.  Since pt is unable to tolerate prednisone, injxn of depo medrol given today in office but I explained to pt that this is not a long term solution.  Will follow.

## 2015-02-04 ENCOUNTER — Other Ambulatory Visit: Payer: Self-pay | Admitting: Family Medicine

## 2015-02-04 ENCOUNTER — Other Ambulatory Visit (INDEPENDENT_AMBULATORY_CARE_PROVIDER_SITE_OTHER): Payer: BLUE CROSS/BLUE SHIELD

## 2015-02-04 ENCOUNTER — Telehealth: Payer: Self-pay | Admitting: Family Medicine

## 2015-02-04 DIAGNOSIS — H16009 Unspecified corneal ulcer, unspecified eye: Secondary | ICD-10-CM

## 2015-02-04 LAB — CBC WITH DIFFERENTIAL/PLATELET
BASOS ABS: 0 10*3/uL (ref 0.0–0.1)
Basophils Relative: 0.3 % (ref 0.0–3.0)
Eosinophils Absolute: 0.1 10*3/uL (ref 0.0–0.7)
Eosinophils Relative: 1.1 % (ref 0.0–5.0)
HEMATOCRIT: 40.8 % (ref 36.0–46.0)
Hemoglobin: 13.7 g/dL (ref 12.0–15.0)
LYMPHS PCT: 30.3 % (ref 12.0–46.0)
Lymphs Abs: 3.2 10*3/uL (ref 0.7–4.0)
MCHC: 33.7 g/dL (ref 30.0–36.0)
MCV: 87.8 fl (ref 78.0–100.0)
MONOS PCT: 2.8 % — AB (ref 3.0–12.0)
Monocytes Absolute: 0.3 10*3/uL (ref 0.1–1.0)
NEUTROS ABS: 6.8 10*3/uL (ref 1.4–7.7)
Neutrophils Relative %: 65.5 % (ref 43.0–77.0)
Platelets: 239 10*3/uL (ref 150.0–400.0)
RBC: 4.64 Mil/uL (ref 3.87–5.11)
RDW: 14.1 % (ref 11.5–15.5)
WBC: 10.4 10*3/uL (ref 4.0–10.5)

## 2015-02-04 LAB — HEPATIC FUNCTION PANEL
ALK PHOS: 57 U/L (ref 39–117)
ALT: 20 U/L (ref 0–35)
AST: 20 U/L (ref 0–37)
Albumin: 3.7 g/dL (ref 3.5–5.2)
BILIRUBIN DIRECT: 0.1 mg/dL (ref 0.0–0.3)
TOTAL PROTEIN: 7.2 g/dL (ref 6.0–8.3)
Total Bilirubin: 0.3 mg/dL (ref 0.2–1.2)

## 2015-02-04 LAB — C-REACTIVE PROTEIN: CRP: 0.7 mg/dL (ref 0.5–20.0)

## 2015-02-04 LAB — ANA: ANA: NEGATIVE

## 2015-02-04 LAB — RHEUMATOID FACTOR: Rhuematoid fact SerPl-aCnc: 14 IU/mL (ref <=14)

## 2015-02-04 NOTE — Telephone Encounter (Signed)
Ok to schedule pt for lab only visit and order requested labs

## 2015-02-04 NOTE — Telephone Encounter (Signed)
Labs were all ordered, can you please call ans schedule lab appt for pt.

## 2015-02-04 NOTE — Telephone Encounter (Signed)
Pt now calling regarding additional bloodwork. Please call pt and MD office. Pt Ph# [phone]

## 2015-02-04 NOTE — Telephone Encounter (Signed)
Spoke with Dr. Wells Guiles office, lab orders will be faxed to our office. Pt is having a Corneal melt and needs these labs completed. Ok to schedule pt?

## 2015-02-04 NOTE — Telephone Encounter (Signed)
Carollee Herter from Dr. Wells Guiles office, [facility] Ph# [phone]  Dr. Noel Gerold wants labs today on pt.  He wants pt to have ppd, corneal melt, ANA, CBC w/diff, BMP, C reactive protein, EST, Rheumatoid factor, C-ANCA/P-ANCA screen, ACE level, RPR, FTA/ABS, Urinary with microscopic analysis, hepatitis function panel, herpes simplex virus titer, HSV1/HSV2, blood glucose levels  Please call asap.

## 2015-02-05 ENCOUNTER — Other Ambulatory Visit: Payer: BLUE CROSS/BLUE SHIELD

## 2015-02-05 LAB — URINALYSIS, ROUTINE W REFLEX MICROSCOPIC
Bilirubin Urine: NEGATIVE
HGB URINE DIPSTICK: NEGATIVE
KETONES UR: NEGATIVE
LEUKOCYTES UA: NEGATIVE
Nitrite: NEGATIVE
RBC / HPF: NONE SEEN (ref 0–?)
Specific Gravity, Urine: 1.025 (ref 1.000–1.030)
Total Protein, Urine: NEGATIVE
URINE GLUCOSE: NEGATIVE
UROBILINOGEN UA: 0.2 (ref 0.0–1.0)
pH: 5.5 (ref 5.0–8.0)

## 2015-02-05 LAB — ANCA SCREEN W REFLEX TITER: ANCA SCREEN: NEGATIVE

## 2015-02-05 LAB — ANGIOTENSIN CONVERTING ENZYME: ANGIOTENSIN-CONVERTING ENZYME: 10 U/L (ref 8–52)

## 2015-02-05 LAB — RPR

## 2015-02-05 NOTE — Telephone Encounter (Signed)
Called to notify pt, no answer, unable to leave VM

## 2015-02-06 ENCOUNTER — Telehealth: Payer: Self-pay | Admitting: Family Medicine

## 2015-02-06 ENCOUNTER — Encounter: Payer: Self-pay | Admitting: General Practice

## 2015-02-06 DIAGNOSIS — B182 Chronic viral hepatitis C: Secondary | ICD-10-CM

## 2015-02-06 DIAGNOSIS — A65 Nonvenereal syphilis: Secondary | ICD-10-CM

## 2015-02-06 DIAGNOSIS — IMO0002 Reserved for concepts with insufficient information to code with codable children: Secondary | ICD-10-CM

## 2015-02-06 LAB — QUANTIFERON TB GOLD ASSAY (BLOOD)
Interferon Gamma Release Assay: NEGATIVE
MITOGEN VALUE: 6.37 [IU]/mL
Quantiferon Nil Value: 0.04 IU/mL
Quantiferon Tb Ag Minus Nil Value: 0.01 IU/mL
TB Ag value: 0.05 IU/mL

## 2015-02-06 LAB — FLUORESCENT TREPONEMAL AB(FTA)-IGG-BLD: FLUORESCENT TREPONEMAL ABS: REACTIVE — AB

## 2015-02-06 LAB — HSV(HERPES SMPLX)ABS-I+II(IGG+IGM)-BLD
HSV 1 GLYCOPROTEIN G AB, IGG: 6.94 IV — AB
HSV 2 GLYCOPROTEIN G AB, IGG: 5.42 IV — AB
Herpes Simplex Vrs I&II-IgM Ab (EIA): 0.65 INDEX

## 2015-02-06 NOTE — Telephone Encounter (Signed)
Dr Noel Gerold was very helpful during our phone conversation.  Pt has not disclosed to them that she is Hep C +.  She now has a negative RPR but a + Treponemal FTAbs suggestive of previous syphilis infection.  Due to her Corneal melt, there is a chance that in the presence of untreated infection she could lose her eye.  Based on this, please enter an urgent referral to ID for the Hep C and the + Treponemal test.

## 2015-02-06 NOTE — Telephone Encounter (Signed)
Caller name: Carollee Herter from [facility]  Call back number: [phone]  fax [phone]    Reason for call: patient is currently at the eye doctor requesting most recent lab work. If lab work is not received patient will have to [facility]

## 2015-02-06 NOTE — Telephone Encounter (Signed)
Agree w/ sending preliminary results to Dr Laruth Bouchard office but it is NOT appropriate for front staff to be commenting on lab results to other offices w/o transferring the call to a clinical team member

## 2015-02-06 NOTE — Telephone Encounter (Signed)
Spoke with pt and lab results were given. Pt was advised that in order to save her eyes she MUST see ID.

## 2015-02-06 NOTE — Telephone Encounter (Signed)
Referral placed and pt notified

## 2015-02-06 NOTE — Telephone Encounter (Signed)
Pt requesting return call for lab results. I advised her, after talking to Jess, that she would be referred to ID and they will contact her for appt. She states she would like a call from nurse/cma.

## 2015-02-06 NOTE — Telephone Encounter (Signed)
Received call from [facility] with [facility] requesting Dr. Beverely Low to call Dr. Noel Gerold on her cell # [phone] to discuss labs.

## 2015-02-06 NOTE — Telephone Encounter (Signed)
Spoke with Carollee Herter at Dr. Laruth Bouchard office, advised that we did not have all the results back (some are still reading as preliminary), but I would fax what we had completed.   Carollee Herter stated that whoever she had spoken with earlier had advised that everything was back with final results and that is why Dr. Dione Booze kept patient in the office. I assured her that I am not sure who gave her that information but unfortunately it was incorrect. Labs including preliminaries were faxed at 9:35am.

## 2015-02-08 ENCOUNTER — Other Ambulatory Visit: Payer: Self-pay | Admitting: Family Medicine

## 2015-02-08 NOTE — Telephone Encounter (Signed)
Rx faxed to [facility].  Attempted to notify patient but no answer, no voicemail.

## 2015-02-08 NOTE — Telephone Encounter (Signed)
Patient requesting Xanax Last refill 01/04/15 for 30 with 0 LOV 02/01/15 Contract signed 12/06/13, UDS not found   Please advise.

## 2015-02-13 ENCOUNTER — Other Ambulatory Visit: Payer: BLUE CROSS/BLUE SHIELD

## 2015-02-13 DIAGNOSIS — B1710 Acute hepatitis C without hepatic coma: Secondary | ICD-10-CM

## 2015-02-13 LAB — IRON: Iron: 87 ug/dL (ref 45–160)

## 2015-02-14 LAB — HEPATITIS B CORE ANTIBODY, TOTAL: Hep B Core Total Ab: REACTIVE — AB

## 2015-02-14 LAB — HIV ANTIBODY (ROUTINE TESTING W REFLEX): HIV: NONREACTIVE

## 2015-02-14 LAB — HEPATITIS B SURFACE ANTIGEN: HEP B S AG: NEGATIVE

## 2015-02-14 LAB — HEPATITIS B SURFACE ANTIBODY,QUALITATIVE: Hep B S Ab: POSITIVE — AB

## 2015-02-14 LAB — HEPATITIS A ANTIBODY, TOTAL: HEP A TOTAL AB: NONREACTIVE

## 2015-02-18 NOTE — Addendum Note (Signed)
Addended by: Mariea Clonts D on: 02/18/2015 05:24 PM   Modules accepted: Orders

## 2015-02-18 NOTE — Addendum Note (Signed)
Addended by: Mariea Clonts D on: 02/18/2015 05:22 PM   Modules accepted: Orders

## 2015-02-19 LAB — HEPATITIS C RNA QUANTITATIVE
HCV QUANT LOG: 6.85 {Log} — AB (ref <1.18)
HCV QUANT: 7019661 [IU]/mL — AB (ref <15)

## 2015-02-20 ENCOUNTER — Other Ambulatory Visit: Payer: Self-pay | Admitting: Family Medicine

## 2015-02-20 NOTE — Telephone Encounter (Signed)
Medication filled to pharmacy as requested.   

## 2015-02-22 LAB — HEPATITIS C GENOTYPE

## 2015-02-26 ENCOUNTER — Encounter: Payer: BLUE CROSS/BLUE SHIELD | Admitting: Medical

## 2015-02-26 ENCOUNTER — Telehealth: Payer: Self-pay | Admitting: Family Medicine

## 2015-02-26 DIAGNOSIS — Z0289 Encounter for other administrative examinations: Secondary | ICD-10-CM

## 2015-02-26 NOTE — Telephone Encounter (Signed)
charge 

## 2015-02-26 NOTE — Telephone Encounter (Signed)
Pt was no show today 02/26/15 8:00am, pt left VM 10/4 7:39am to cancel appt, charge for no show?

## 2015-02-26 NOTE — Progress Notes (Signed)
This encounter was created in error - please disregard.

## 2015-03-02 ENCOUNTER — Other Ambulatory Visit: Payer: Self-pay | Admitting: Family Medicine

## 2015-03-04 ENCOUNTER — Encounter: Payer: Self-pay | Admitting: Family Medicine

## 2015-03-04 ENCOUNTER — Ambulatory Visit (INDEPENDENT_AMBULATORY_CARE_PROVIDER_SITE_OTHER): Payer: BLUE CROSS/BLUE SHIELD | Admitting: Family Medicine

## 2015-03-04 VITALS — BP 123/73 | HR 84 | Temp 97.9°F | Resp 16 | Ht 62.0 in | Wt 182.1 lb

## 2015-03-04 DIAGNOSIS — J0110 Acute frontal sinusitis, unspecified: Secondary | ICD-10-CM | POA: Diagnosis not present

## 2015-03-04 DIAGNOSIS — I10 Essential (primary) hypertension: Secondary | ICD-10-CM | POA: Diagnosis not present

## 2015-03-04 DIAGNOSIS — H16002 Unspecified corneal ulcer, left eye: Secondary | ICD-10-CM | POA: Diagnosis not present

## 2015-03-04 MED ORDER — BENAZEPRIL-HYDROCHLOROTHIAZIDE 10-12.5 MG PO TABS: 1.0000 | ORAL_TABLET | Freq: Every day | ORAL | Status: DC

## 2015-03-04 MED ORDER — AMOXICILLIN 875 MG PO TABS: 875.0000 mg | ORAL_TABLET | Freq: Two times a day (BID) | ORAL | Status: DC

## 2015-03-04 NOTE — Patient Instructions (Signed)
Schedule your complete physical for after 07/18/15 Continue your current BP meds daily (refill sent) Call your eye doctor about the medications but consider restarting the Prednisolone drops Start the Amoxicillin twice daily- w/ food- for your current sinus infection Drink plenty of fluids REST! Call with any questions or concerns Hang in there!!!

## 2015-03-04 NOTE — Progress Notes (Signed)
   Subjective:    Patient ID: Vanessa [name], female    DOB: 1961/05/04, 54 y.o.   MRN: [id number]  HPI Corneal Meld- following w/ Ophtho.  'it's cleared up'.  Pt is not taking Acyclovir or using the Prednisolone drops.  Has appt upcoming w/ Hep C clinic on 10/26.  HTN- much better today.  On Benazepril HCTZ daily.  No CP, SOB, HAs, visual changes, edema.  URI- sxs started ~5 days ago.  Cough productive of 'mucous'.  + facial pressure.  No tooth pain.  No ear pain.  No fever.  No N/V.   Review of Systems For ROS see HPI     Objective:   Physical Exam  Constitutional: She is oriented to person, place, and time. She appears well-developed and well-nourished. No distress.  HENT:  Head: Normocephalic and atraumatic.  Right Ear: Tympanic membrane normal.  Left Ear: Tympanic membrane normal.  Nose: Mucosal edema and rhinorrhea present. Right sinus exhibits maxillary sinus tenderness and frontal sinus tenderness. Left sinus exhibits maxillary sinus tenderness and frontal sinus tenderness.  Mouth/Throat: Uvula is midline and mucous membranes are normal. Posterior oropharyngeal erythema present. No oropharyngeal exudate.  Eyes: EOM are normal. Pupils are equal, round, and reactive to light.  L corneal meld visible superiorly  Neck: Normal range of motion. Neck supple.  Cardiovascular: Normal rate, regular rhythm and normal heart sounds.   Pulmonary/Chest: Effort normal and breath sounds normal. No respiratory distress. She has no wheezes.  Musculoskeletal: She exhibits no edema.  Lymphadenopathy:    She has no cervical adenopathy.  Neurological: She is alert and oriented to person, place, and time.  Skin: Skin is warm and dry.  Psychiatric: She has a normal mood and affect. Her behavior is normal. Thought content normal.  Vitals reviewed.         Assessment & Plan:

## 2015-03-04 NOTE — Progress Notes (Signed)
Pre visit review using our clinic review tool, if applicable. No additional management support is needed unless otherwise documented below in the visit note/SLS  

## 2015-03-05 NOTE — Assessment & Plan Note (Signed)
Improved since last visit.  No med changes at this time.  Asymptomatic.  Will continue to follow.

## 2015-03-05 NOTE — Assessment & Plan Note (Signed)
Ongoing issue for pt.  Following w/ ophtho.  Encouraged her to discuss stopping her medications w/ ophtho prior to doing so- but since she already stopped the Acyclovir and the Prednisolone eye drops, she will restart the eye drops and make contact with her doctor.  Pt expressed understanding and is in agreement w/ plan.

## 2015-03-05 NOTE — Assessment & Plan Note (Signed)
New.  Pt's sxs and PE consistent w/ infxn.  Start abx.  Reviewed supportive care and red flags that should prompt return.  Pt expressed understanding and is in agreement w/ plan.  

## 2015-03-13 ENCOUNTER — Other Ambulatory Visit: Payer: Self-pay | Admitting: Physician Assistant

## 2015-03-13 NOTE — Telephone Encounter (Signed)
Request for refill on: Alprazolam 0.5 mg Last Rx: 02/08/15, #30x0 Last OV: 03/04/15 Return: Per AVS - Schedule complete physical for after 07/18/15 Please advise on refills/SLS

## 2015-03-20 ENCOUNTER — Encounter: Payer: Self-pay | Admitting: Internal Medicine

## 2015-03-20 ENCOUNTER — Ambulatory Visit (INDEPENDENT_AMBULATORY_CARE_PROVIDER_SITE_OTHER): Payer: BLUE CROSS/BLUE SHIELD | Admitting: Internal Medicine

## 2015-03-20 VITALS — BP 125/85 | HR 88 | Temp 98.4°F | Ht 61.0 in | Wt 179.0 lb

## 2015-03-20 DIAGNOSIS — B182 Chronic viral hepatitis C: Secondary | ICD-10-CM

## 2015-03-20 DIAGNOSIS — Z23 Encounter for immunization: Secondary | ICD-10-CM

## 2015-03-20 DIAGNOSIS — H209 Unspecified iridocyclitis: Secondary | ICD-10-CM

## 2015-03-20 MED ORDER — LEDIPASVIR-SOFOSBUVIR 90-400 MG PO TABS: 1.0000 | ORAL_TABLET | Freq: Every day | ORAL | Status: DC

## 2015-03-20 NOTE — Progress Notes (Signed)
[facility]   CC: consideration for treatment for chronic hepatitis C  HPI:  +Vanessa [name] is a 54 y.o. female who presents for initial evaluation and management of chronic hepatitis C.  Patient tested positive more than 10 years ago. 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 Patient denies multiple sexual partners, renal dialysis, sexual contact with person with liver disease, tattoos. Patient has had other studies performed. Results: hepatitis C RNA by PCR, result: positive. Patient has not had prior treatment for Hepatitis C. Patient does not have a past history of liver disease. Patient does not have a family history of liver disease. Patient does not  have associated signs or symptoms related to liver disease.  Labs reviewed and confirm chronic hepatitis C with a positive viral load.   Records reviewed from PCP in regards to hepatitis C and uveitis.  Work up also included T pallidum antibody in the setting of a negative RPR.  The patient does report a history of treated syphilis at the time of her daughters birth and does remember being treated.  Her uveitis also is now resolved according to her which indicated that syphilis is not the culprit.       Patient does not have documented immunity to Hepatitis A. Patient does have documented immunity to Hepatitis B.    Review of Systems:   Constitutional: negative for fatigue and malaise Eyes: negative for visual disturbance, irritation, redness and no issues with uveitis Gastrointestinal: negative for dyspepsia and change in bowel habits Neurological: negative for headaches, dizziness, memory problems and gait problems All other systems reviewed and are negative      Past Medical History  Diagnosis Date  . Hypertension   . Depression   . Hepatitis C   . Anxiety   . Gall stone   . Chronic knee pain   . Syphilis     Prior to Admission medications     Medication Sig Start Date End Date Taking? Authorizing Provider  acyclovir (ZOVIRAX) 400 MG tablet Take 400 mg by mouth 5 (five) times daily. Uveitis    Historical Provider, MD  albuterol (PROVENTIL HFA;VENTOLIN HFA) 108 (90 BASE) MCG/ACT inhaler Inhale 2 puffs into the lungs every 6 (six) hours as needed for wheezing or shortness of breath. 04/06/14   Carollee Leitz, NP  ALPRAZolam Prudy Feeler) 0.5 MG tablet TAKE 1 TABLET BY MOUTH DAILY AS NEEDED 03/14/15   Sheliah Hatch, MD  amoxicillin (AMOXIL) 875 MG tablet Take 1 tablet (875 mg total) by mouth 2 (two) times daily. 03/04/15   Sheliah Hatch, MD  atropine 1 % ophthalmic solution Place into the right eye 3 (three) times daily.    Historical Provider, MD  benazepril-hydrochlorthiazide (LOTENSIN HCT) 10-12.5 MG tablet Take 1 tablet by mouth daily. 03/04/15   Sheliah Hatch, MD  ibuprofen (ADVIL,MOTRIN) 800 MG tablet TAKE 1 TABLET THREE TIMES A DAY AS NEEDED 02/20/15   Sheliah Hatch, MD  prednisoLONE acetate (PRED FORTE) 1 % ophthalmic suspension Place 1 drop into the right eye 4 (four) times daily.    Historical Provider, MD  ranitidine (ZANTAC) 150 MG capsule Take 1 capsule (150 mg total) by mouth 2 (two) times daily. 11/20/14   Edward Saguier, PA-C  SUBOXONE 8-2 MG FILM Place 0.5 strips under the tongue 2 (two) times daily. 04/18/13   Historical Provider, MD  venlafaxine XR (EFFEXOR-XR) 150 MG 24 hr capsule TAKE 1  CAPSULE (150 MG TOTAL) BY MOUTH DAILY WITH BREAKFAST. 10/05/14   Sheliah Hatch, MD    No Known Allergies  Social History  Substance Use Topics  . Smoking status: Current Every Day Smoker -- 1.00 packs/day for 35 years    Types: Cigarettes  . Smokeless tobacco: Never Used  . Alcohol Use: Yes     Comment: "last alcohol was in the 1990's"    Family History  Problem Relation Age of Onset  . Drug abuse Brother   . Esophageal cancer Brother   . Hypertension Mother   . Diabetes Mother   . Colon cancer Brother   .  Hypertension Sister     x's 2  . Diabetes Sister     oldest sister  . Stomach cancer Neg Hx    No cirrhosis or liver cancer   Objective:  Constitutional: in no apparent distress and alert; Blood pressure 125/85, pulse 88, temperature 98.4 F (36.9 C), temperature source Oral, height  (1.549 m), weight 179 lb (81.194 kg). Eyes: anicteric Cardiovascular: Cor RRR and No murmurs Respiratory: clear Gastrointestinal: Bowel sounds are normal, liver is not enlarged, spleen is not enlarged Musculoskeletal: peripheral pulses normal, no pedal edema, no clubbing or cyanosis Skin: negative for - jaundice, spider hemangioma, telangiectasia, palmar erythema, ecchymosis and atrophy; no porphyria cutanea tarda Lymphatic: no cervical lymphadenopathy   Laboratory Genotype:  Lab Results  Component Value Date   HCVGENOTYPE 1a 02/13/2015   HCV viral load:  Lab Results  Component Value Date   HCVQUANT [id number]* 02/13/2015   Lab Results  Component Value Date   WBC 10.4 02/04/2015   HGB 13.7 02/04/2015   HCT 40.8 02/04/2015   MCV 87.8 02/04/2015   PLT 239.0 02/04/2015    Lab Results  Component Value Date   CREATININE 0.73 11/24/2014   BUN 11 11/24/2014   NA 139 11/24/2014   K 4.1 11/24/2014   CL 104 11/24/2014   CO2 26 11/24/2014    Lab Results  Component Value Date   ALT 20 02/04/2015   AST 20 02/04/2015   ALKPHOS 57 02/04/2015     Labs and history reviewed and show CHILD-PUGH A  5-6 points: Child class A 7-9 points: Child class B 10-15 points: Child class C  Lab Results  Component Value Date   INR 1.08 11/24/2014   BILITOT 0.3 02/04/2015   ALBUMIN 3.7 02/04/2015     Assessment: New Patient with Chronic Hepatitis C genotype 1a, untreated.  I discussed with the patient the lab findings that confirm chronic hepatitis C as well as the natural history and progression of disease including about 30% of people who develop cirrhosis of the liver if left untreated and once  cirrhosis is established there is a 2-7% risk per year of liver cancer and liver failure.  I discussed the importance of treatment and benefits in reducing the risk, even if significant liver fibrosis exists.   Also a history of syphilis and positive antibody, treated with penicillin injections, asymptomatic.  RPR negative.  Had uveitis but resolved without syphilis treatment which indicates it was not related.    Plan: 1) Patient counseled extensively on limiting acetaminophen to no more than 2 grams daily, avoidance of alcohol. 2) Transmission discussed with patient including sexual transmission, sharing razors and toothbrush.   3) Will need referral to gastroenterology if concern for cirrhosis 4) Will need referral for substance abuse counseling: No.; Further work up to include urine drug screen  No. 5) Will  prescribe Harvoni for 12 weeks 6) Hepatitis A vaccine Yes.   7) Hepatitis B vaccine No. 8) Pneumovax vaccine if concern for cirrhosis 9) Further work up to include liver staging with elastography 10) will follow up after elastography.   11) no further work up for uveitis and syphilis.

## 2015-03-20 NOTE — Patient Instructions (Signed)
Date 03/20/2015  Dear Ms Vanessa [name], As discussed in the ID Clinic, your hepatitis C therapy will include the following medications:          Harvoni 90mg /400mg  tablet:           Take 1 tablet by mouth once daily   Please note that ALL MEDICATIONS WILL START ON THE SAME DATE for a total of 12 weeks. ---------------------------------------------------------------- Your HCV Treatment Start Date: TBA   Your HCV genotype:  1a    Liver Fibrosis: TBD    ---------------------------------------------------------------- YOUR PHARMACY CONTACT:   [facility] [address] Phone: [phone] Hours: Monday to Friday 7:30 am to 6:00 pm   Please always contact your pharmacy at least 3-4 business days before you run out of medications to ensure your next month's medication is ready or 1 week prior to running out if you receive it by mail.  Remember, each prescription is for 28 days. ---------------------------------------------------------------- GENERAL NOTES REGARDING YOUR HEPATITIS C MEDICATION:  SOFOSBUVIR/LEDIPASVIR (HARVONI): - Harvoni tablet is taken daily with OR without food. - The tablets are orange. - The tablets should be stored at room temperature.  - Acid reducing agents such as H2 blockers (ie. Pepcid (famotidine), Zantac (ranitidine), Tagamet (cimetidine), Axid (nizatidine) and proton pump inhibitors (ie. Prilosec (omeprazole), Protonix (pantoprazole), Nexium (esomeprazole), or Aciphex (rabeprazole)) can decrease effectiveness of Harvoni. Do not take until you have discussed with a health care provider.    -Antacids that contain magnesium and/or aluminum hydroxide (ie. Milk of Magensia, Rolaids, Gaviscon, Maalox, Mylanta, an dArthritis Pain Formula)can reduce absorption of Harvoni, so take them at least 4 hours before or after Harvoni.  -Calcium carbonate (calcium supplements or antacids such as Tums, Caltrate,  Os-Cal)needs to be taken at least 4 hours hours before or after Harvoni.  -St. John's wort or any products that contain St. John's wort like some herbal supplements  Please inform the office prior to starting any of these medications.  - The common side effects associated with Harvoni include:      1. Fatigue      2. Headache      3. Nausea      4. Diarrhea      5. Insomnia  Please note that this only lists the most common side effects and is NOT a comprehensive list of the potential side effects of these medications. For more information, please review the drug information sheets that come with your medication package from the pharmacy.  ---------------------------------------------------------------- GENERAL HELPFUL HINTS ON HCV THERAPY: 1. Stay well-hydrated. 2. Notify the ID Clinic of any changes in your other over-the-counter/herbal or prescription medications. 3. If you miss a dose of your medication, take the missed dose as soon as you remember. Return to your regular time/dose schedule the next day.  4.  Do not stop taking your medications without first talking with your healthcare provider. 5.  You may take Tylenol (acetaminophen), as long as the dose is less than 2000 mg (OR no more than 4 tablets of the Tylenol Extra Strengths 500mg  tablet) in 24 hours. 6.  You will see our pharmacist-specialist within the first 2 weeks of starting your medication. 7.  You will need to obtain routine labs around week 4 and12 weeks after starting and then 3 to 6 months after finishing Harvoni.    Vanessa RighterOMER, ROBERT, MD  [facility] [address] [place],  [place]  [address] [phone]

## 2015-03-20 NOTE — Addendum Note (Signed)
Addended by: Wendall MolaOCKERHAM, Armon Orvis A on: 03/20/2015 12:11 PM   Modules accepted: Orders

## 2015-04-09 ENCOUNTER — Other Ambulatory Visit: Payer: Self-pay | Admitting: Family Medicine

## 2015-04-09 NOTE — Telephone Encounter (Signed)
Medication filled to pharmacy as requested.   

## 2015-04-12 ENCOUNTER — Other Ambulatory Visit: Payer: Self-pay | Admitting: Family Medicine

## 2015-04-12 ENCOUNTER — Telehealth: Payer: Self-pay | Admitting: Family Medicine

## 2015-04-12 ENCOUNTER — Ambulatory Visit: Payer: BLUE CROSS/BLUE SHIELD | Admitting: Family Medicine

## 2015-04-12 DIAGNOSIS — Z0289 Encounter for other administrative examinations: Secondary | ICD-10-CM

## 2015-04-14 ENCOUNTER — Encounter (HOSPITAL_BASED_OUTPATIENT_CLINIC_OR_DEPARTMENT_OTHER): Payer: Self-pay

## 2015-04-14 ENCOUNTER — Emergency Department (HOSPITAL_BASED_OUTPATIENT_CLINIC_OR_DEPARTMENT_OTHER)
Admission: EM | Admit: 2015-04-14 | Discharge: 2015-04-14 | Disposition: A | Discharge status: Home / Self Care | Payer: BLUE CROSS/BLUE SHIELD | Attending: Emergency Medicine | Admitting: Emergency Medicine

## 2015-04-14 ENCOUNTER — Emergency Department (HOSPITAL_BASED_OUTPATIENT_CLINIC_OR_DEPARTMENT_OTHER): Payer: BLUE CROSS/BLUE SHIELD

## 2015-04-14 DIAGNOSIS — R05 Cough: Secondary | ICD-10-CM | POA: Diagnosis present

## 2015-04-14 DIAGNOSIS — J40 Bronchitis, not specified as acute or chronic: Secondary | ICD-10-CM | POA: Diagnosis not present

## 2015-04-14 DIAGNOSIS — G8929 Other chronic pain: Secondary | ICD-10-CM | POA: Diagnosis not present

## 2015-04-14 DIAGNOSIS — I10 Essential (primary) hypertension: Secondary | ICD-10-CM | POA: Diagnosis not present

## 2015-04-14 DIAGNOSIS — H9209 Otalgia, unspecified ear: Secondary | ICD-10-CM | POA: Diagnosis not present

## 2015-04-14 DIAGNOSIS — F17210 Nicotine dependence, cigarettes, uncomplicated: Secondary | ICD-10-CM | POA: Insufficient documentation

## 2015-04-14 DIAGNOSIS — J069 Acute upper respiratory infection, unspecified: Secondary | ICD-10-CM | POA: Diagnosis not present

## 2015-04-14 DIAGNOSIS — M791 Myalgia: Secondary | ICD-10-CM | POA: Diagnosis not present

## 2015-04-14 DIAGNOSIS — Z8719 Personal history of other diseases of the digestive system: Secondary | ICD-10-CM | POA: Diagnosis not present

## 2015-04-14 DIAGNOSIS — F419 Anxiety disorder, unspecified: Secondary | ICD-10-CM | POA: Insufficient documentation

## 2015-04-14 DIAGNOSIS — Z8619 Personal history of other infectious and parasitic diseases: Secondary | ICD-10-CM | POA: Diagnosis not present

## 2015-04-14 DIAGNOSIS — Z7952 Long term (current) use of systemic steroids: Secondary | ICD-10-CM | POA: Diagnosis not present

## 2015-04-14 DIAGNOSIS — F329 Major depressive disorder, single episode, unspecified: Secondary | ICD-10-CM | POA: Diagnosis not present

## 2015-04-14 DIAGNOSIS — Z79899 Other long term (current) drug therapy: Secondary | ICD-10-CM | POA: Diagnosis not present

## 2015-04-14 DIAGNOSIS — R112 Nausea with vomiting, unspecified: Secondary | ICD-10-CM | POA: Insufficient documentation

## 2015-04-14 MED ORDER — DM-GUAIFENESIN ER 30-600 MG PO TB12: 1.0000 | ORAL_TABLET | Freq: Two times a day (BID) | ORAL | Status: DC

## 2015-04-14 MED ORDER — NAPROXEN 500 MG PO TABS: 500.0000 mg | ORAL_TABLET | Freq: Two times a day (BID) | ORAL | Status: DC

## 2015-04-14 MED ORDER — ALBUTEROL SULFATE HFA 108 (90 BASE) MCG/ACT IN AERS
1.0000 | INHALATION_SPRAY | Freq: Four times a day (QID) | RESPIRATORY_TRACT | Status: DC
  Administered 2015-04-14: 2 via RESPIRATORY_TRACT
  Filled 2015-04-14: qty 6.7

## 2015-04-14 NOTE — Discharge Instructions (Signed)
How to Use an Inhaler Using your inhaler correctly is very important. Good technique will make sure that the medicine reaches your lungs.  HOW TO USE AN INHALER: 1. Take the cap off the inhaler. 2. If this is the first time using your inhaler, you need to prime it. Shake the inhaler for 5 seconds. Release four puffs into the air, away from your face. Ask your doctor for help if you have questions. 3. Shake the inhaler for 5 seconds. 4. Turn the inhaler so the bottle is above the mouthpiece. 5. Put your pointer finger on top of the bottle. Your thumb holds the bottom of the inhaler. 6. Open your mouth. 7. Either hold the inhaler away from your mouth (the width of 2 fingers) or place your lips tightly around the mouthpiece. Ask your doctor which way to use your inhaler. 8. Breathe out as much air as possible. 9. Breathe in and push down on the bottle 1 time to release the medicine. You will feel the medicine go in your mouth and throat. 10. Continue to take a deep breath in very slowly. Try to fill your lungs. 11. After you have breathed in completely, hold your breath for 10 seconds. This will help the medicine to settle in your lungs. If you cannot hold your breath for 10 seconds, hold it for as long as you can before you breathe out. 12. Breathe out slowly, through pursed lips. Whistling is an example of pursed lips. 13. If your doctor has told you to take more than 1 puff, wait at least 15-30 seconds between puffs. This will help you get the best results from your medicine. Do not use the inhaler more than your doctor tells you to. 14. Put the cap back on the inhaler. 15. Follow the directions from your doctor or from the inhaler package about cleaning the inhaler. If you use more than one inhaler, ask your doctor which inhalers to use and what order to use them in. Ask your doctor to help you figure out when you will need to refill your inhaler.  If you use a steroid inhaler, always rinse your  mouth with water after your last puff, gargle and spit out the water. Do not swallow the water. GET HELP IF:  The inhaler medicine only partially helps to stop wheezing or shortness of breath.  You are having trouble using your inhaler.  You have some increase in thick spit (phlegm). GET HELP RIGHT AWAY IF:  The inhaler medicine does not help your wheezing or shortness of breath or you have tightness in your chest.  You have dizziness, headaches, or fast heart rate.  You have chills, fever, or night sweats.  You have a large increase of thick spit, or your thick spit is bloody. MAKE SURE YOU:   Understand these instructions.  Will watch your condition.  Will get help right away if you are not doing well or get worse.   This information is not intended to replace advice given to you by your health care provider. Make sure you discuss any questions you have with your health care provider.   Document Released: 02/18/2008 Document Revised: 03/01/2013 Document Reviewed: 12/08/2012 Elsevier Interactive Patient Education 2016 Elsevier Inc.  Use albuterol inhaler 2 puffs every 6 hours at least for the next 7 days. This will help open your lungs up and suppress the cough. Take the Mucinex DM on a regular basis for cough and congestion. Take the Naprosyn as directed for  the sore throat and body aches. Today's chest x-ray was negative for pneumonia. Make appointment to follow-up with your regular doctor. Return for any new or worse symptoms.

## 2015-04-14 NOTE — ED Provider Notes (Signed)
CSN: [id number]     Arrival date & time 04/14/15  4098 History   First MD Initiated Contact with Patient 04/14/15 0751     Chief Complaint  Patient presents with  . Cough     (Consider location/radiation/quality/duration/timing/severity/associated sxs/prior Treatment) Patient is a 54 y.o. female presenting with cough. The history is provided by the patient.  Cough Associated symptoms: chest pain, ear pain, myalgias and sore throat   Associated symptoms: no fever, no headaches and no rash    patient with complaint of upper respiratory infection congestion and productive cough bodyaches sore throat some chest pain with coughing. Symptoms been ongoing for 4 days. Denies any fevers. Has had some vomiting no diarrhea. Also complaints of bilateral ear pain.  Past Medical History  Diagnosis Date  . Hypertension   . Depression   . Hepatitis C   . Anxiety   . Gall stone   . Chronic knee pain   . Syphilis    Past Surgical History  Procedure Laterality Date  . Ectopic pregnancy surgery    . Carpal tunnel release Left   . Cholecystectomy N/A 11/24/2014    Procedure: LAPAROSCOPIC CHOLECYSTECTOMY WITH INTRAOPERATIVE CHOLANGIOGRAM;  Surgeon: Gaynelle Adu, MD;  Location: [facility];  Service: General;  Laterality: N/A;   Family History  Problem Relation Age of Onset  . Drug abuse Brother   . Esophageal cancer Brother   . Hypertension Mother   . Diabetes Mother   . Colon cancer Brother   . Hypertension Sister     x's 2  . Diabetes Sister     oldest sister  . Stomach cancer Neg Hx    Social History  Substance Use Topics  . Smoking status: Current Every Day Smoker -- 1.00 packs/day for 35 years    Types: Cigarettes    Start date: 05/25/1994  . Smokeless tobacco: Never Used  . Alcohol Use: No     Comment: "last alcohol was in the 1990's"   OB History    No data available     Review of Systems  Constitutional: Negative for fever.  HENT: Positive for congestion, ear pain and sore  throat.   Eyes: Negative for redness.  Respiratory: Positive for cough.   Cardiovascular: Positive for chest pain.  Gastrointestinal: Positive for nausea and vomiting. Negative for abdominal pain and diarrhea.  Genitourinary: Negative for dysuria.  Musculoskeletal: Positive for myalgias.  Skin: Negative for rash.  Neurological: Negative for headaches.  Hematological: Does not bruise/bleed easily.  Psychiatric/Behavioral: Negative for confusion.      Allergies  Review of patient's allergies indicates no known allergies.  Home Medications   Prior to Admission medications   Medication Sig Start Date End Date Taking? Authorizing Provider  acyclovir (ZOVIRAX) 400 MG tablet Take 400 mg by mouth 5 (five) times daily. Uveitis    Historical Provider, MD  albuterol (PROVENTIL HFA;VENTOLIN HFA) 108 (90 BASE) MCG/ACT inhaler Inhale 2 puffs into the lungs every 6 (six) hours as needed for wheezing or shortness of breath. 04/06/14   Carollee Leitz, NP  ALPRAZolam Prudy Feeler) 0.5 MG tablet TAKE 1 TABLET BY MOUTH DAILY AS NEEDED 03/14/15   Sheliah Hatch, MD  atropine 1 % ophthalmic solution Place into the right eye 3 (three) times daily.    Historical Provider, MD  benazepril-hydrochlorthiazide (LOTENSIN HCT) 10-12.5 MG tablet Take 1 tablet by mouth daily. 03/04/15   Sheliah Hatch, MD  dextromethorphan-guaiFENesin [facility] DM) 30-600 MG 12hr tablet Take 1 tablet by mouth  2 (two) times daily. 04/14/15   Vanetta MuldersScott Beckett Maden, MD  ibuprofen (ADVIL,MOTRIN) 800 MG tablet TAKE 1 TABLET THREE TIMES A DAY AS NEEDED 02/20/15   Sheliah HatchKatherine E Tabori, MD  Ledipasvir-Sofosbuvir (HARVONI) 90-400 MG TABS Take 1 tablet by mouth daily. 03/20/15   Gardiner Barefootobert W Comer, MD  naproxen (NAPROSYN) 500 MG tablet Take 1 tablet (500 mg total) by mouth 2 (two) times daily. 04/14/15   Vanetta MuldersScott Cleatus Goodin, MD  prednisoLONE acetate (PRED FORTE) 1 % ophthalmic suspension Place 1 drop into the right eye 4 (four) times daily.    Historical  Provider, MD  ranitidine (ZANTAC) 150 MG capsule Take 1 capsule (150 mg total) by mouth 2 (two) times daily. 11/20/14   Edward Saguier, PA-C  SUBOXONE 8-2 MG FILM Place 0.5 strips under the tongue 2 (two) times daily. 04/18/13   Historical Provider, MD  venlafaxine XR (EFFEXOR-XR) 150 MG 24 hr capsule TAKE 1 CAPSULE (150 MG TOTAL) BY MOUTH DAILY WITH BREAKFAST. 04/09/15   Sheliah HatchKatherine E Tabori, MD   BP 134/96 mmHg  Pulse 90  Temp(Src) 98 F (36.7 C) (Oral)  Resp 20  Ht 5\' 1"  (1.549 m)  Wt 170 lb (77.111 kg)  BMI 32.14 kg/m2  SpO2 94% Physical Exam  Constitutional: She is oriented to person, place, and time. She appears well-developed and well-nourished. No distress.  HENT:  Head: Normocephalic and atraumatic.  Right Ear: External ear normal.  Left Ear: External ear normal.  Mouth/Throat: Oropharynx is clear and moist.  Eyes: Conjunctivae and EOM are normal. Pupils are equal, round, and reactive to light.  Neck: Normal range of motion. Neck supple.  Cardiovascular: Normal rate and normal heart sounds.   No murmur heard. Pulmonary/Chest: Effort normal. No respiratory distress. She has wheezes.  Faint bilateral wheezing. No retractions.  Abdominal: Soft. Bowel sounds are normal. There is no tenderness.  Musculoskeletal: Normal range of motion.  Lymphadenopathy:    She has no cervical adenopathy.  Neurological: She is alert and oriented to person, place, and time. No cranial nerve deficit. She exhibits normal muscle tone. Coordination normal.  Skin: Skin is warm. No rash noted.  Nursing note and vitals reviewed.   ED Course  Procedures (including critical care time) Labs Review Labs Reviewed - No data to display  Imaging Review Dg Chest 2 View  04/14/2015  CLINICAL DATA:  Productive cough. EXAM: CHEST  2 VIEW COMPARISON:  04/06/2014 FINDINGS: The heart size and mediastinal contours are within normal limits. Both lungs are clear. The visualized skeletal structures are unremarkable.  IMPRESSION: Normal exam. Electronically Signed   By: Francene BoyersJames  Maxwell M.D.   On: 04/14/2015 08:04   I have personally reviewed and evaluated these images and lab results as part of my medical decision-making.   EKG Interpretation None      MDM   Final diagnoses:  URI (upper respiratory infection)  Bronchitis    Chest x-ray negative for pneumonia. Patient with normal room air sats 94%. Patient nontoxic no acute distress. Patient's symptoms consistent with upper respiratory infection and bronchitis. Some of it is flulike however patient is 4 days into the illness. Patient will be treated with Mucinex DM albuterol inhaler and Naprosyn.    Vanetta MuldersScott Mazin Emma, MD 04/14/15 [phone]

## 2015-04-14 NOTE — ED Notes (Signed)
Patient here with productive cough and congestion x 4 days, denies fever. Reports some vomiting with same, + smoker. Also complains of bilateral ear pain, mild sore throat

## 2015-04-15 NOTE — Telephone Encounter (Signed)
Med filled and placed at front desk.

## 2015-04-15 NOTE — Telephone Encounter (Signed)
Pt was no show 04/12/15 11:30am for acute appt, pt went to ER 11/20, [facility], 3rd no show since 01/15/15, charge or no charge?  Adding Dr. Beverely Lowabori due to multiple no shows

## 2015-04-15 NOTE — Telephone Encounter (Signed)
charge 

## 2015-04-15 NOTE — Telephone Encounter (Signed)
Last OV 03/04/15 Alprazolam last filled 03/14/15 #30 with 0  CSC, needs UDS

## 2015-04-16 ENCOUNTER — Telehealth: Payer: Self-pay | Admitting: Physician Assistant

## 2015-04-16 ENCOUNTER — Ambulatory Visit: Payer: BLUE CROSS/BLUE SHIELD | Admitting: Physician Assistant

## 2015-04-16 DIAGNOSIS — Z0289 Encounter for other administrative examinations: Secondary | ICD-10-CM

## 2015-04-17 ENCOUNTER — Ambulatory Visit (HOSPITAL_COMMUNITY)
Admission: RE | Admit: 2015-04-17 | Discharge: 2015-04-17 | Disposition: A | Discharge status: Home / Self Care | Payer: BLUE CROSS/BLUE SHIELD | Source: Ambulatory Visit | Attending: Internal Medicine | Admitting: Internal Medicine

## 2015-04-17 DIAGNOSIS — B182 Chronic viral hepatitis C: Secondary | ICD-10-CM

## 2015-04-23 ENCOUNTER — Ambulatory Visit (HOSPITAL_COMMUNITY)
Admission: RE | Admit: 2015-04-23 | Discharge: 2015-04-23 | Disposition: A | Discharge status: Home / Self Care | Payer: BLUE CROSS/BLUE SHIELD | Source: Ambulatory Visit | Attending: Internal Medicine | Admitting: Internal Medicine

## 2015-04-23 DIAGNOSIS — Z9049 Acquired absence of other specified parts of digestive tract: Secondary | ICD-10-CM | POA: Diagnosis not present

## 2015-04-23 DIAGNOSIS — B182 Chronic viral hepatitis C: Secondary | ICD-10-CM | POA: Diagnosis present

## 2015-04-24 NOTE — Telephone Encounter (Signed)
Pt was no show 04/16/15 9:00am for acute appt, pt did not reschedule with our office, 4th no show since 01/15/15, charge or no charge?  Adding Dr. Beverely Lowabori for multiple no shows.

## 2015-04-24 NOTE — Telephone Encounter (Signed)
Charge. 

## 2015-04-24 NOTE — Telephone Encounter (Signed)
Please send pt a letter indicating that she has had 4 no-shows since August and if she has 1 more, she will be dismissed as a pt.  Office policy is 3 no shows in a year and she has already exceeded that.

## 2015-04-25 ENCOUNTER — Encounter: Payer: Self-pay | Admitting: General Practice

## 2015-04-25 NOTE — Telephone Encounter (Signed)
Letter written

## 2015-04-30 ENCOUNTER — Other Ambulatory Visit: Payer: Self-pay | Admitting: Pharmacist Clinician (PhC)/ Clinical Pharmacy Specialist

## 2015-04-30 ENCOUNTER — Ambulatory Visit: Payer: BLUE CROSS/BLUE SHIELD | Admitting: Internal Medicine

## 2015-04-30 MED ORDER — LEDIPASVIR-SOFOSBUVIR 90-400 MG PO TABS: 1.0000 | ORAL_TABLET | Freq: Every day | ORAL | Status: DC

## 2015-05-02 ENCOUNTER — Encounter: Payer: Self-pay | Admitting: Pharmacy Technician

## 2015-05-13 ENCOUNTER — Other Ambulatory Visit: Payer: Self-pay | Admitting: Physician Assistant

## 2015-05-13 NOTE — Telephone Encounter (Signed)
Will defer further refills of patient's medications to PCP  

## 2015-05-14 ENCOUNTER — Ambulatory Visit: Payer: BLUE CROSS/BLUE SHIELD | Admitting: Pharmacist Clinician (PhC)/ Clinical Pharmacy Specialist

## 2015-05-14 DIAGNOSIS — B182 Chronic viral hepatitis C: Secondary | ICD-10-CM

## 2015-05-14 NOTE — Progress Notes (Signed)
Patient ID: Vanessa HakimSusie [name], female   DOB: July 04, 1960, 54 y.o.   MRN: [id number] HPI: Vanessa [name] is a 54 y.o. female who is here for her 2 weeks f/u after starting her hep C meds.   Lab Results  Component Value Date   HCVGENOTYPE 1a 02/13/2015    Allergies: No Known Allergies  Vitals:    Past Medical History: Past Medical History  Diagnosis Date  . Hypertension   . Depression   . Hepatitis C   . Anxiety   . Gall stone   . Chronic knee pain   . Syphilis     Social History: Social History   Social History  . Marital Status: Single    Spouse Name: N/A  . Number of Children: 1  . Years of Education: N/A   Occupational History  . Dialysis Tech    Social History Main Topics  . Smoking status: Current Every Day Smoker -- 1.00 packs/day for 35 years    Types: Cigarettes    Start date: 05/25/1994  . Smokeless tobacco: Never Used  . Alcohol Use: No     Comment: "last alcohol was in the 1990's"  . Drug Use: No     Comment: "last drug use was in the 1990's; never used heroin"  . Sexual Activity: Not Currently   Other Topics Concern  . Not on file   Social History Narrative   Works at [employer] [place]. Lives with daughter.    Labs: HEP B S AB (no units)  Date Value  02/13/2015 POS*   HEPATITIS B SURFACE AG (no units)  Date Value  02/13/2015 NEGATIVE    Lab Results  Component Value Date   HCVGENOTYPE 1a 02/13/2015    Hepatitis C RNA quantitative Latest Ref Rng 02/13/2015 04/27/2013  HCV Quantitative <15 IU/mL 7019661(H) [id number](H)  HCV Quantitative Log <1.18 log 10 6.85(H) 7.13(H)    AST (U/L)  Date Value  02/04/2015 20  11/24/2014 28  11/23/2014 28   ALT (U/L)  Date Value  02/04/2015 20  11/24/2014 25  11/23/2014 30   INR (no units)  Date Value  11/24/2014 1.08    CrCl: CrCl cannot be calculated (Unknown ideal weight.).  Fibrosis Score: F2/3 as assessed by [facility]  Child-Pugh Score: Class A  Previous Treatment  Regimen: Naive  Assessment: Vanessa [name] is her after starting her therapy on 12/9. She has not had any side effects from Harvoni. 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 I stressed to her repeatedly to never miss any dose to due the risk of failure. Answered her questions. Cleaned up her meds profile. No interaction noted. She is going to come back for labs in 2 weeks and appt with Dr Luciana Axeomer in March.   Recommendations:  Cont Harvoni 1 PO qday VL in 2 wks Dr Luciana Axeomer in March  Ulyses [place], Vanessa [name], [place]arm[place]., BCPS, AAHIVP Clinical Infectious Disease Pharmacist [facility] 05/14/2015, 9:04 AM

## 2015-05-14 NOTE — Telephone Encounter (Signed)
Last OV 03-04-15 Alprazolam last filled 04-15-15 #30 with 0

## 2015-05-14 NOTE — Telephone Encounter (Signed)
Medication filled to pharmacy as requested.   

## 2015-05-30 ENCOUNTER — Other Ambulatory Visit: Payer: BLUE CROSS/BLUE SHIELD

## 2015-05-30 DIAGNOSIS — B182 Chronic viral hepatitis C: Secondary | ICD-10-CM

## 2015-05-31 LAB — HEPATITIS C RNA QUANTITATIVE: HCV Quantitative Log: <1.18 {Log} (ref <1.18)

## 2015-06-08 ENCOUNTER — Other Ambulatory Visit: Payer: Self-pay | Admitting: Family Medicine

## 2015-06-10 NOTE — Telephone Encounter (Signed)
Last OV: 03/04/2015 Last filled: 05/14/2015, #30, 0 RF UDS: 04/17/2008

## 2015-06-10 NOTE — Telephone Encounter (Signed)
Faxed hardcopy for Alprazolam to [facility] [address] [place]

## 2015-07-15 ENCOUNTER — Other Ambulatory Visit: Payer: Self-pay | Admitting: Family Medicine

## 2015-07-15 NOTE — Telephone Encounter (Signed)
Medication filled to pharmacy as requested.   

## 2015-07-15 NOTE — Telephone Encounter (Signed)
Last OV 03/04/15 Alprazolam last filled 06/10/15 #30 with 0

## 2015-07-25 ENCOUNTER — Ambulatory Visit (INDEPENDENT_AMBULATORY_CARE_PROVIDER_SITE_OTHER): Payer: BLUE CROSS/BLUE SHIELD | Admitting: Internal Medicine

## 2015-07-25 VITALS — BP 138/89 | HR 92 | Temp 98.5°F | Ht 61.0 in | Wt 191.0 lb

## 2015-07-25 DIAGNOSIS — K7400 Hepatic fibrosis, unspecified: Secondary | ICD-10-CM

## 2015-07-25 DIAGNOSIS — F1911 Other psychoactive substance abuse, in remission: Secondary | ICD-10-CM

## 2015-07-25 DIAGNOSIS — F1910 Other psychoactive substance abuse, uncomplicated: Secondary | ICD-10-CM

## 2015-07-25 DIAGNOSIS — B182 Chronic viral hepatitis C: Secondary | ICD-10-CM | POA: Diagnosis not present

## 2015-07-25 LAB — COMPREHENSIVE METABOLIC PANEL
ALBUMIN: 3.6 g/dL (ref 3.6–5.1)
ALK PHOS: 48 U/L (ref 33–130)
ALT: 17 U/L (ref 6–29)
AST: 20 U/L (ref 10–35)
BILIRUBIN TOTAL: 0.3 mg/dL (ref 0.2–1.2)
BUN: 14 mg/dL (ref 7–25)
CALCIUM: 8.9 mg/dL (ref 8.6–10.4)
CO2: 25 mmol/L (ref 20–31)
Chloride: 102 mmol/L (ref 98–110)
Creat: 0.55 mg/dL (ref 0.50–1.05)
GLUCOSE: 99 mg/dL (ref 65–99)
Potassium: 4.3 mmol/L (ref 3.5–5.3)
Sodium: 137 mmol/L (ref 135–146)
Total Protein: 7.2 g/dL (ref 6.1–8.1)

## 2015-07-25 NOTE — Assessment & Plan Note (Signed)
She continues to do well and pleased with her new life.

## 2015-07-25 NOTE — Assessment & Plan Note (Signed)
Doing great.  Will check end of treatment viral load today and rtc 4 months for SVR12.

## 2015-07-25 NOTE — Assessment & Plan Note (Signed)
Discussed results and liver care including continued alcohol avoidance.  Will repeat in about 1 year to compare and see if further [facility] screening indicated.

## 2015-07-25 NOTE — Progress Notes (Signed)
   Subjective:    Patient ID: Vanessa [name], female    DOB: Feb 11, 1961, 55 y.o.   MRN: [id number]  HPI Here for follow up of HCV. History of drug use over 20 years ago, previous alcohol and now not drinking at all, has genotype 1a, viral load initially 7 million and elastography with F2/3.  Had early viral load done in January and was undetectable.  Now has just 2 more pills of Harvoni to complete 12 weeks.  Hepatitis A non-immune and received #1, not yet due for #2.  Feels well.     Review of Systems  Constitutional: Negative for fatigue.  Gastrointestinal: Negative for nausea and diarrhea.  Skin: Negative for rash.  Neurological: Negative for dizziness.       Objective:   Physical Exam  Constitutional: She appears well-developed and well-nourished. No distress.  Eyes: No scleral icterus.  Cardiovascular: Normal rate, regular rhythm and normal heart sounds.   No murmur heard. Pulmonary/Chest: Effort normal and breath sounds normal. No respiratory distress.  Skin: No rash noted.    Social History   Social History  . Marital Status: Single    Spouse Name: N/A  . Number of Children: 1  . Years of Education: N/A   Occupational History  . Dialysis Tech    Social History Main Topics  . Smoking status: Current Every Day Smoker -- 1.00 packs/day for 35 years    Types: Cigarettes    Start date: 05/25/1994  . Smokeless tobacco: Never Used  . Alcohol Use: No     Comment: "last alcohol was in the 1990's"  . Drug Use: No     Comment: "last drug use was in the 1990's; never used heroin"  . Sexual Activity: Not Currently   Other Topics Concern  . Not on file   Social History Narrative   Works at [employer] in [place]. Lives with daughter.        Assessment & Plan:

## 2015-07-26 LAB — HEPATITIS C RNA QUANTITATIVE: HCV Quantitative: NOT DETECTED IU/mL (ref <15)

## 2015-07-29 ENCOUNTER — Encounter: Payer: Self-pay | Admitting: Family Medicine

## 2015-07-31 ENCOUNTER — Other Ambulatory Visit: Payer: Self-pay | Admitting: Family Medicine

## 2015-07-31 NOTE — Telephone Encounter (Signed)
Medication filled to pharmacy as requested.   

## 2015-08-02 ENCOUNTER — Telehealth: Payer: Self-pay | Admitting: Behavioral Health

## 2015-08-02 NOTE — Telephone Encounter (Signed)
Attempted to reach patient for Pre-Visit Call. Per recording, a message cannot be left at this time.

## 2015-08-05 ENCOUNTER — Other Ambulatory Visit: Payer: Self-pay

## 2015-08-05 ENCOUNTER — Ambulatory Visit (INDEPENDENT_AMBULATORY_CARE_PROVIDER_SITE_OTHER): Payer: BLUE CROSS/BLUE SHIELD | Admitting: Family Medicine

## 2015-08-05 ENCOUNTER — Telehealth: Payer: Self-pay | Admitting: Family Medicine

## 2015-08-05 ENCOUNTER — Encounter: Payer: Self-pay | Admitting: Family Medicine

## 2015-08-05 VITALS — BP 126/88 | HR 91 | Temp 98.3°F | Resp 17 | Ht 61.0 in | Wt 189.2 lb

## 2015-08-05 DIAGNOSIS — J410 Simple chronic bronchitis: Secondary | ICD-10-CM

## 2015-08-05 DIAGNOSIS — Z1231 Encounter for screening mammogram for malignant neoplasm of breast: Secondary | ICD-10-CM | POA: Diagnosis not present

## 2015-08-05 DIAGNOSIS — Z23 Encounter for immunization: Secondary | ICD-10-CM | POA: Diagnosis not present

## 2015-08-05 DIAGNOSIS — Z0000 Encounter for general adult medical examination without abnormal findings: Secondary | ICD-10-CM

## 2015-08-05 LAB — HEPATIC FUNCTION PANEL
ALK PHOS: 53 U/L (ref 39–117)
ALT: 20 U/L (ref 0–35)
AST: 21 U/L (ref 0–37)
Albumin: 3.9 g/dL (ref 3.5–5.2)
BILIRUBIN DIRECT: 0 mg/dL (ref 0.0–0.3)
BILIRUBIN TOTAL: 0.3 mg/dL (ref 0.2–1.2)
TOTAL PROTEIN: 7.7 g/dL (ref 6.0–8.3)

## 2015-08-05 LAB — LIPID PANEL
CHOL/HDL RATIO: 4
Cholesterol: 184 mg/dL (ref 0–200)
HDL: 49.1 mg/dL (ref >39.00)
LDL CALC: 111 mg/dL — AB (ref 0–99)
NONHDL: 134.78
Triglycerides: 118 mg/dL (ref 0.0–149.0)
VLDL: 23.6 mg/dL (ref 0.0–40.0)

## 2015-08-05 LAB — CBC WITH DIFFERENTIAL/PLATELET
BASOS ABS: 0 10*3/uL (ref 0.0–0.1)
BASOS PCT: 0.2 % (ref 0.0–3.0)
EOS PCT: 0.8 % (ref 0.0–5.0)
Eosinophils Absolute: 0.1 10*3/uL (ref 0.0–0.7)
HEMATOCRIT: 42.2 % (ref 36.0–46.0)
Hemoglobin: 14.1 g/dL (ref 12.0–15.0)
LYMPHS ABS: 2.1 10*3/uL (ref 0.7–4.0)
LYMPHS PCT: 26.1 % (ref 12.0–46.0)
MCHC: 33.5 g/dL (ref 30.0–36.0)
MCV: 87.9 fl (ref 78.0–100.0)
MONOS PCT: 3 % (ref 3.0–12.0)
Monocytes Absolute: 0.2 10*3/uL (ref 0.1–1.0)
NEUTROS ABS: 5.5 10*3/uL (ref 1.4–7.7)
NEUTROS PCT: 69.9 % (ref 43.0–77.0)
PLATELETS: 241 10*3/uL (ref 150.0–400.0)
RBC: 4.8 Mil/uL (ref 3.87–5.11)
RDW: 14.5 % (ref 11.5–15.5)
WBC: 7.9 10*3/uL (ref 4.0–10.5)

## 2015-08-05 LAB — VITAMIN D 25 HYDROXY (VIT D DEFICIENCY, FRACTURES): VITD: 13.77 ng/mL — ABNORMAL LOW (ref 30.00–100.00)

## 2015-08-05 LAB — BASIC METABOLIC PANEL
BUN: 13 mg/dL (ref 6–23)
CALCIUM: 9.2 mg/dL (ref 8.4–10.5)
CO2: 29 meq/L (ref 19–32)
CREATININE: 0.61 mg/dL (ref 0.40–1.20)
Chloride: 101 mEq/L (ref 96–112)
GFR: 131.43 mL/min (ref >60.00)
Glucose, Bld: 121 mg/dL — ABNORMAL HIGH (ref 70–99)
Potassium: 4 mEq/L (ref 3.5–5.1)
SODIUM: 138 meq/L (ref 135–145)

## 2015-08-05 LAB — TSH: TSH: 2.52 u[IU]/mL (ref 0.35–4.50)

## 2015-08-05 MED ORDER — ERGOCALCIFEROL 1.25 MG (50000 UT) PO CAPS: 50000.0000 [IU] | ORAL_CAPSULE | ORAL | Status: DC

## 2015-08-05 NOTE — Progress Notes (Signed)
Pre visit review using our clinic review tool, if applicable. No additional management support is needed unless otherwise documented below in the visit note. 

## 2015-08-05 NOTE — Telephone Encounter (Signed)
If pt is later than 15 minutes she will have to reschedule.

## 2015-08-05 NOTE — Patient Instructions (Signed)
Follow up in 6 months to recheck BP We'll notify you of your lab results and make any changes if needed Stop downstairs and schedule your mammogram We'll call you with your pulmonary appt for the smoking and wheezing You are up to date on pap and colonoscopy- yay! Call with any questions or concerns If you want to join us at the new [place] office, any scheduled appointments will automatically transfer and we will see you at [address], [place], [place] [id number] (OPENING 08/15/15) Happy [birth year] Birthday!!!

## 2015-08-05 NOTE — Progress Notes (Signed)
   Subjective:    Patient ID: Vanessa [name], female    DOB: 1960/12/29, 55 y.o.   MRN: [id number]  HPI CPE- UTD on pap, colonoscopy.  Due for mammo.  Due for Tdap.     Review of Systems Patient reports no vision/ hearing changes, adenopathy,fever, weight change,  persistant/recurrent hoarseness , swallowing issues, chest pain, palpitations, edema, persistant/recurrent cough, hemoptysis, dyspnea (rest/exertional/paroxysmal nocturnal), gastrointestinal bleeding (melena, rectal bleeding), abdominal pain, significant heartburn, bowel changes, GU symptoms (dysuria, hematuria, incontinence), Gyn symptoms (abnormal  bleeding, pain),  syncope, focal weakness, memory loss, numbness & tingling, skin/hair/nail changes, abnormal bruising or bleeding, anxiety, or depression.     Objective:   Physical Exam General Appearance:    Alert, cooperative, no distress, appears stated age  Head:    Normocephalic, without obvious abnormality, atraumatic  Eyes:    PERRL, conjunctiva/corneas clear, EOM's intact, fundi    benign, both eyes  Ears:    Normal TM's and external ear canals, both ears  Nose:   Nares normal, septum midline, mucosa normal, no drainage    or sinus tenderness  Throat:   Lips, mucosa, and tongue normal; teeth and gums normal  Neck:   Supple, symmetrical, trachea midline, no adenopathy;    Thyroid: no enlargement/tenderness/nodules  Back:     Symmetric, no curvature, ROM normal, no CVA tenderness  Lungs:     Clear to auscultation bilaterally, respirations unlabored  Chest Wall:    No tenderness or deformity   Heart:    Regular rate and rhythm, S1 and S2 normal, no murmur, rub   or gallop  Breast Exam:    Deferred to mammo  Abdomen:     Soft, non-tender, bowel sounds active all four quadrants,    no masses, no organomegaly  Genitalia:    Deferred  Rectal:    Extremities:   Extremities normal, atraumatic, no cyanosis or edema  Pulses:   2+ and symmetric all extremities  Skin:   Skin  color, texture, turgor normal, no rashes or lesions  Lymph nodes:   Cervical, supraclavicular, and axillary nodes normal  Neurologic:   CNII-XII intact, normal strength, sensation and reflexes    throughout          Assessment & Plan:

## 2015-08-05 NOTE — Assessment & Plan Note (Signed)
Pt's PE WNL.  UTD on pap, colonoscopy- due for mammo (order entered).  Tdap updated.  Check labs.  Anticipatory guidance provided.

## 2015-08-05 NOTE — Telephone Encounter (Signed)
Pt lvm at 9:27 stating that she will be running a little late for her appt with PCP at 10:00

## 2015-08-05 NOTE — Assessment & Plan Note (Signed)
Pt continues to smoke.  Very difficult for her to quit given hx of polysubstance abuse.  Will refer to pulm due to intermittent wheezing.  Pt expressed understanding and is in agreement w/ plan.

## 2015-08-06 ENCOUNTER — Ambulatory Visit (HOSPITAL_BASED_OUTPATIENT_CLINIC_OR_DEPARTMENT_OTHER)
Admission: RE | Admit: 2015-08-06 | Discharge: 2015-08-06 | Disposition: A | Discharge status: Home / Self Care | Payer: BLUE CROSS/BLUE SHIELD | Source: Ambulatory Visit | Attending: Family Medicine | Admitting: Family Medicine

## 2015-08-06 ENCOUNTER — Other Ambulatory Visit (INDEPENDENT_AMBULATORY_CARE_PROVIDER_SITE_OTHER): Payer: BLUE CROSS/BLUE SHIELD

## 2015-08-06 ENCOUNTER — Other Ambulatory Visit: Payer: Self-pay | Admitting: General Practice

## 2015-08-06 ENCOUNTER — Ambulatory Visit (HOSPITAL_BASED_OUTPATIENT_CLINIC_OR_DEPARTMENT_OTHER): Payer: BLUE CROSS/BLUE SHIELD

## 2015-08-06 DIAGNOSIS — R7309 Other abnormal glucose: Secondary | ICD-10-CM | POA: Diagnosis not present

## 2015-08-06 DIAGNOSIS — Z1231 Encounter for screening mammogram for malignant neoplasm of breast: Secondary | ICD-10-CM | POA: Diagnosis not present

## 2015-08-06 LAB — HEMOGLOBIN A1C: Hgb A1c MFr Bld: 6.3 % (ref 4.6–6.5)

## 2015-08-06 MED ORDER — VITAMIN D (ERGOCALCIFEROL) 1.25 MG (50000 UNIT) PO CAPS: 50000.0000 [IU] | ORAL_CAPSULE | ORAL | Status: DC

## 2015-08-14 ENCOUNTER — Other Ambulatory Visit: Payer: Self-pay | Admitting: Family Medicine

## 2015-08-15 NOTE — Telephone Encounter (Signed)
Last OV 08/05/15 Alprazolam last filled 07/15/15 #30 with 0

## 2015-09-02 ENCOUNTER — Telehealth: Payer: Self-pay | Admitting: Family Medicine

## 2015-09-02 MED ORDER — BENAZEPRIL-HYDROCHLOROTHIAZIDE 10-12.5 MG PO TABS: 1.0000 | ORAL_TABLET | Freq: Every day | ORAL | Status: DC

## 2015-09-02 NOTE — Telephone Encounter (Signed)
Medication filled to pharmacy as requested.   

## 2015-09-02 NOTE — Telephone Encounter (Signed)
[facility] [address]   Says she called the pharmacy but they could not find out office in their system.

## 2015-09-09 DIAGNOSIS — Z716 Tobacco abuse counseling: Secondary | ICD-10-CM | POA: Diagnosis not present

## 2015-09-13 ENCOUNTER — Other Ambulatory Visit: Payer: Self-pay | Admitting: General Practice

## 2015-09-13 MED ORDER — ALPRAZOLAM 0.5 MG PO TABS: ORAL_TABLET | ORAL | Status: DC

## 2015-09-13 NOTE — Telephone Encounter (Signed)
Medication filled to pharmacy as requested.   

## 2015-09-13 NOTE — Telephone Encounter (Signed)
Last OV 08/05/15 Alprazolam last filled 08/15/15 #30 with 0

## 2015-09-18 ENCOUNTER — Other Ambulatory Visit: Payer: Self-pay | Admitting: General Practice

## 2015-09-18 NOTE — Telephone Encounter (Signed)
Last OV 08/05/15 Alprazolam last filled 09/13/15 #30 with 0

## 2015-09-25 ENCOUNTER — Telehealth: Payer: Self-pay | Admitting: *Deleted

## 2015-09-25 NOTE — Telephone Encounter (Signed)
Called pt to assess c/o "seeing spots" per request of CMA. Pt is scheduled w/ Dr. Beverely Lowabori 09/26/15. Pt's home and mobile numbers are both invalid. Called work number and was told that pt has left for the day. MyChart message sent to pt asking her to return call or call 911 if alarm symptoms.

## 2015-09-26 ENCOUNTER — Telehealth: Payer: Self-pay | Admitting: General Practice

## 2015-09-26 ENCOUNTER — Ambulatory Visit (INDEPENDENT_AMBULATORY_CARE_PROVIDER_SITE_OTHER): Payer: BLUE CROSS/BLUE SHIELD | Admitting: Family Medicine

## 2015-09-26 ENCOUNTER — Encounter: Payer: Self-pay | Admitting: Family Medicine

## 2015-09-26 VITALS — BP 138/92 | HR 102 | Temp 97.9°F | Resp 16 | Ht 61.0 in | Wt 192.1 lb

## 2015-09-26 DIAGNOSIS — H16002 Unspecified corneal ulcer, left eye: Secondary | ICD-10-CM | POA: Diagnosis not present

## 2015-09-26 DIAGNOSIS — H2513 Age-related nuclear cataract, bilateral: Secondary | ICD-10-CM | POA: Diagnosis not present

## 2015-09-26 DIAGNOSIS — H18462 Peripheral corneal degeneration, left eye: Secondary | ICD-10-CM | POA: Diagnosis not present

## 2015-09-26 DIAGNOSIS — H17821 Peripheral opacity of cornea, right eye: Secondary | ICD-10-CM | POA: Diagnosis not present

## 2015-09-26 DIAGNOSIS — H16041 Marginal corneal ulcer, right eye: Secondary | ICD-10-CM | POA: Diagnosis not present

## 2015-09-26 NOTE — Telephone Encounter (Signed)
Called and notified patient that Dr. Beverely Lowabori called and spoke with Dr. Noel Geroldohen and both providers feel that due to severity of patient's conditions she needs to be seen ASAP with her eye doctor's office. Patient states that she does not know the number but she is driving over there right now to be seen.    Called patient sister to get correct phone number and chart was updated to reflect.

## 2015-09-26 NOTE — Progress Notes (Signed)
   Subjective:    Patient ID: Vanessa [name], female    DOB: September 05, 1960, 55 y.o.   MRN: [id number]  HPI L eye pain- pt reports she is having 'white spots' in the corner and upper lid of her eyes.  Pt reports eye is very painful and unable to rinse with water.  No spotty vision, no blurry or double vision.   Review of Systems For ROS see HPI     Objective:   Physical Exam  Constitutional: She is oriented to person, place, and time. She appears well-developed and well-nourished. No distress.  HENT:  Head: Normocephalic and atraumatic.  Eyes: Pupils are equal, round, and reactive to light.  Pt w/ obvious white ulcer of L eye inferiorly and laterally, very painful eye movement  Neurological: She is alert and oriented to person, place, and time.  Psychiatric: She has a normal mood and affect. Her behavior is normal. Thought content normal.  Vitals reviewed.         Assessment & Plan:

## 2015-09-26 NOTE — Patient Instructions (Signed)
Restart the Acyclovir as previously directed by Dr Noel Geroldohen If the pain or white spots do not improve by Monday you must go see the eye doctor Call with any questions or concerns Hang in there!!!

## 2015-09-26 NOTE — Telephone Encounter (Signed)
Agree that pt needs to be seen so this is the right decision

## 2015-09-26 NOTE — Assessment & Plan Note (Signed)
Strongly encouraged pt to see her ophthalmologist due to hx of similar and risk of losing eye.  Pt states she doesn't have the copay and would like to restart the Acyclovir that she was given in the fall.  I explained that this is serious and needs evaluated by a trained eye professional- she asked me to call her eye doctor instead.  Dr Noel Geroldohen called back after pt left the office and is in agreement that she needs to be seen rather than just restarting the Acyclovir.  Pt was called and told that she must see an eye doctor today.  She states that she is en route to their office.

## 2015-09-26 NOTE — Progress Notes (Signed)
Pre visit review using our clinic review tool, if applicable. No additional management support is needed unless otherwise documented below in the visit note. 

## 2015-09-30 ENCOUNTER — Other Ambulatory Visit: Payer: Self-pay | Admitting: General Practice

## 2015-09-30 MED ORDER — VENLAFAXINE HCL ER 150 MG PO CP24: ORAL_CAPSULE | ORAL | Status: DC

## 2015-10-15 ENCOUNTER — Other Ambulatory Visit: Payer: Self-pay | Admitting: General Practice

## 2015-10-15 NOTE — Telephone Encounter (Signed)
Last OV 09/26/15 Alprazolam last filled 09/13/15 #30 with 0

## 2015-10-16 MED ORDER — ALPRAZOLAM 0.5 MG PO TABS: ORAL_TABLET | ORAL | Status: DC

## 2015-10-16 NOTE — Telephone Encounter (Signed)
Medication filled to pharmacy as requested.   

## 2015-12-03 ENCOUNTER — Ambulatory Visit (INDEPENDENT_AMBULATORY_CARE_PROVIDER_SITE_OTHER): Payer: BLUE CROSS/BLUE SHIELD | Admitting: Family Medicine

## 2015-12-03 ENCOUNTER — Encounter: Payer: Self-pay | Admitting: Family Medicine

## 2015-12-03 VITALS — BP 124/79 | HR 80 | Temp 98.1°F | Resp 16 | Ht 61.0 in | Wt 195.5 lb

## 2015-12-03 DIAGNOSIS — R600 Localized edema: Secondary | ICD-10-CM

## 2015-12-03 DIAGNOSIS — R609 Edema, unspecified: Secondary | ICD-10-CM | POA: Insufficient documentation

## 2015-12-03 LAB — CBC WITH DIFFERENTIAL/PLATELET
Basophils Absolute: 0.1 10*3/uL (ref 0.0–0.1)
Basophils Relative: 0.7 % (ref 0.0–3.0)
EOS ABS: 0.1 10*3/uL (ref 0.0–0.7)
Eosinophils Relative: 1 % (ref 0.0–5.0)
HCT: 40.7 % (ref 36.0–46.0)
HEMOGLOBIN: 13.4 g/dL (ref 12.0–15.0)
Lymphocytes Relative: 28.2 % (ref 12.0–46.0)
Lymphs Abs: 2.1 10*3/uL (ref 0.7–4.0)
MCHC: 33 g/dL (ref 30.0–36.0)
MCV: 88.3 fl (ref 78.0–100.0)
MONO ABS: 0.3 10*3/uL (ref 0.1–1.0)
Monocytes Relative: 4 % (ref 3.0–12.0)
Neutro Abs: 5 10*3/uL (ref 1.4–7.7)
Neutrophils Relative %: 66.1 % (ref 43.0–77.0)
Platelets: 245 10*3/uL (ref 150.0–400.0)
RBC: 4.61 Mil/uL (ref 3.87–5.11)
RDW: 14.5 % (ref 11.5–15.5)
WBC: 7.6 10*3/uL (ref 4.0–10.5)

## 2015-12-03 LAB — BASIC METABOLIC PANEL
BUN: 11 mg/dL (ref 6–23)
CHLORIDE: 101 meq/L (ref 96–112)
CO2: 33 meq/L — AB (ref 19–32)
Calcium: 9.2 mg/dL (ref 8.4–10.5)
Creatinine, Ser: 0.6 mg/dL (ref 0.40–1.20)
GFR: 133.8 mL/min (ref >60.00)
GLUCOSE: 97 mg/dL (ref 70–99)
Potassium: 4.2 mEq/L (ref 3.5–5.1)
SODIUM: 140 meq/L (ref 135–145)

## 2015-12-03 LAB — HEPATIC FUNCTION PANEL
ALBUMIN: 4 g/dL (ref 3.5–5.2)
ALK PHOS: 52 U/L (ref 39–117)
ALT: 16 U/L (ref 0–35)
AST: 19 U/L (ref 0–37)
BILIRUBIN DIRECT: 0.1 mg/dL (ref 0.0–0.3)
TOTAL PROTEIN: 7.4 g/dL (ref 6.0–8.3)
Total Bilirubin: 0.4 mg/dL (ref 0.2–1.2)

## 2015-12-03 LAB — TSH: TSH: 1.65 u[IU]/mL (ref 0.35–4.50)

## 2015-12-03 MED ORDER — FUROSEMIDE 20 MG PO TABS: 20.0000 mg | ORAL_TABLET | Freq: Every day | ORAL | Status: DC

## 2015-12-03 NOTE — Progress Notes (Signed)
Pre visit review using our clinic review tool, if applicable. No additional management support is needed unless otherwise documented below in the visit note. 

## 2015-12-03 NOTE — Patient Instructions (Signed)
Follow up in 3-4 weeks to recheck swelling We'll notify you of your lab results and make any changes if needed Make sure you are drinking plenty of fluids Try and eat regularly and limit your salt intake if possible Take the Lasix- 1 tab daily as needed for swelling Call with any questions or concerns Hang in there!!!

## 2015-12-03 NOTE — Progress Notes (Signed)
   Subjective:    Patient ID: Vanessa [name], female    DOB: [birth year], [age] y.o.   MRN: [id number]  HPI LE edema- pt reports she has been swelling for 'a couple of weeks'.  sxs are bilateral.  Had some redness to lower leg that neighbor reports looked like broken capillaries.  No new medications.  Pt reports poor diet recently due to financial restrictions.  Poor water intake.  No swelling of hands.  No CP, SOB.   Review of Systems For ROS see HPI     Objective:   Physical Exam  Constitutional: She is oriented to person, place, and time. She appears well-developed and well-nourished. No distress.  HENT:  Head: Normocephalic and atraumatic.  Eyes: Conjunctivae and EOM are normal. Pupils are equal, round, and reactive to light.  Neck: Normal range of motion. Neck supple. No thyromegaly present.  Cardiovascular: Normal rate, regular rhythm, normal heart sounds and intact distal pulses.   No murmur heard. Pulmonary/Chest: Effort normal and breath sounds normal. No respiratory distress.  Abdominal: Soft. She exhibits no distension. There is no tenderness.  Musculoskeletal: She exhibits edema (1-2+ nonpitting edema of bilateral LEs). She exhibits no tenderness.  Lymphadenopathy:    She has no cervical adenopathy.  Neurological: She is alert and oriented to person, place, and time.  Skin: Skin is warm and dry. There is erythema (bilateral LE redness, non-blanching).  Psychiatric: She has a normal mood and affect. Her behavior is normal.  Vitals reviewed.         Assessment & Plan:

## 2015-12-03 NOTE — Assessment & Plan Note (Signed)
New.  Pt has bilateral LE edema.  No swelling of hands, no SOB or crackles on PE.  No murmur heard.  I suspect her swelling is due to increased heat/humidity, poor dietary choices, limited water intake, and too much salt.  Check labs to r/o thyroid abnormality, metabolic abnormality.  Start Lasix to improve LE and reviewed dietary and lifestyle modifications.  Will follow closely.

## 2016-01-21 DIAGNOSIS — H17821 Peripheral opacity of cornea, right eye: Secondary | ICD-10-CM | POA: Diagnosis not present

## 2016-01-21 DIAGNOSIS — H18462 Peripheral corneal degeneration, left eye: Secondary | ICD-10-CM | POA: Diagnosis not present

## 2016-01-21 DIAGNOSIS — H2513 Age-related nuclear cataract, bilateral: Secondary | ICD-10-CM | POA: Diagnosis not present

## 2016-01-21 DIAGNOSIS — H16041 Marginal corneal ulcer, right eye: Secondary | ICD-10-CM | POA: Diagnosis not present

## 2016-01-23 ENCOUNTER — Telehealth: Payer: Self-pay | Admitting: Family Medicine

## 2016-01-23 NOTE — Telephone Encounter (Signed)
Pt asking for a call back, pt states that she is still seeing a white spot with her eyes and just went to her eye dr and was put on meds, pt states this is the 3rd time, also pt states that she is having rectal bleeding.

## 2016-01-23 NOTE — Telephone Encounter (Signed)
Noted, pt again stated that she will have to see what her schedule is like next week.

## 2016-01-23 NOTE — Telephone Encounter (Signed)
Pt informed that she would need to contact her eye doctor in regards to the spots in her vision. We do not have the equipment to treat or monitor.. Pt states that she is [facility]. When pt was informed that she would have to be seen for treatment and offered an appt for Friday 01/24/16, pt declined stating that she would call back once she found out if she had a day off of work next week.

## 2016-01-23 NOTE — Telephone Encounter (Signed)
Given her rectal bleeding, I would prefer that she be seen sooner rather than later.  Sorry for the confusion on the [facility]- I made a mistake.

## 2016-01-28 ENCOUNTER — Other Ambulatory Visit: Payer: Self-pay | Admitting: General Practice

## 2016-01-28 ENCOUNTER — Telehealth: Payer: Self-pay | Admitting: Family Medicine

## 2016-01-28 MED ORDER — FUROSEMIDE 20 MG PO TABS: 20.0000 mg | ORAL_TABLET | Freq: Every day | ORAL | 1 refills | Status: DC

## 2016-01-28 NOTE — Telephone Encounter (Signed)
Pt states that she can not take prednisone pills and asking if she can come in for a shot.

## 2016-01-28 NOTE — Telephone Encounter (Signed)
I advised patient of KT response. Advised if she is still having rectal bleeding she needs an appointment to be seen. Due to today being KT half day we do not have any open appointments. Offered tomorrow but [place], which was given to her.

## 2016-01-28 NOTE — Telephone Encounter (Signed)
I don't know why we are talking about Prednisone or who prescribed it for her.  She cannot come here for a shot b/c I did not prescribe this course of treatment nor do I know what we're treating.  Also, if she is still having rectal bleeding she must come in for an appt

## 2016-01-29 DIAGNOSIS — H18461 Peripheral corneal degeneration, right eye: Secondary | ICD-10-CM | POA: Diagnosis not present

## 2016-01-29 DIAGNOSIS — H2513 Age-related nuclear cataract, bilateral: Secondary | ICD-10-CM | POA: Diagnosis not present

## 2016-01-29 DIAGNOSIS — H17822 Peripheral opacity of cornea, left eye: Secondary | ICD-10-CM | POA: Diagnosis not present

## 2016-01-30 DIAGNOSIS — H01024 Squamous blepharitis left upper eyelid: Secondary | ICD-10-CM | POA: Diagnosis not present

## 2016-01-30 DIAGNOSIS — F17200 Nicotine dependence, unspecified, uncomplicated: Secondary | ICD-10-CM | POA: Diagnosis not present

## 2016-01-30 DIAGNOSIS — H2513 Age-related nuclear cataract, bilateral: Secondary | ICD-10-CM | POA: Diagnosis not present

## 2016-01-30 DIAGNOSIS — I10 Essential (primary) hypertension: Secondary | ICD-10-CM | POA: Diagnosis not present

## 2016-01-30 DIAGNOSIS — H01021 Squamous blepharitis right upper eyelid: Secondary | ICD-10-CM | POA: Diagnosis not present

## 2016-01-30 DIAGNOSIS — H04123 Dry eye syndrome of bilateral lacrimal glands: Secondary | ICD-10-CM | POA: Diagnosis not present

## 2016-01-30 DIAGNOSIS — H16001 Unspecified corneal ulcer, right eye: Secondary | ICD-10-CM | POA: Diagnosis not present

## 2016-01-30 DIAGNOSIS — Z9119 Patient's noncompliance with other medical treatment and regimen: Secondary | ICD-10-CM | POA: Diagnosis not present

## 2016-01-31 DIAGNOSIS — H04123 Dry eye syndrome of bilateral lacrimal glands: Secondary | ICD-10-CM | POA: Diagnosis not present

## 2016-01-31 DIAGNOSIS — H16001 Unspecified corneal ulcer, right eye: Secondary | ICD-10-CM | POA: Diagnosis not present

## 2016-01-31 DIAGNOSIS — H2513 Age-related nuclear cataract, bilateral: Secondary | ICD-10-CM | POA: Diagnosis not present

## 2016-01-31 DIAGNOSIS — H01021 Squamous blepharitis right upper eyelid: Secondary | ICD-10-CM | POA: Diagnosis not present

## 2016-02-05 DIAGNOSIS — Z79899 Other long term (current) drug therapy: Secondary | ICD-10-CM | POA: Diagnosis not present

## 2016-02-05 DIAGNOSIS — H16001 Unspecified corneal ulcer, right eye: Secondary | ICD-10-CM | POA: Diagnosis not present

## 2016-02-05 DIAGNOSIS — H2513 Age-related nuclear cataract, bilateral: Secondary | ICD-10-CM | POA: Diagnosis not present

## 2016-02-05 DIAGNOSIS — I10 Essential (primary) hypertension: Secondary | ICD-10-CM | POA: Diagnosis not present

## 2016-02-05 DIAGNOSIS — F17200 Nicotine dependence, unspecified, uncomplicated: Secondary | ICD-10-CM | POA: Diagnosis not present

## 2016-02-05 DIAGNOSIS — H01021 Squamous blepharitis right upper eyelid: Secondary | ICD-10-CM | POA: Diagnosis not present

## 2016-02-05 DIAGNOSIS — H04123 Dry eye syndrome of bilateral lacrimal glands: Secondary | ICD-10-CM | POA: Diagnosis not present

## 2016-02-05 DIAGNOSIS — H01024 Squamous blepharitis left upper eyelid: Secondary | ICD-10-CM | POA: Diagnosis not present

## 2016-02-14 ENCOUNTER — Other Ambulatory Visit: Payer: Self-pay | Admitting: Family Medicine

## 2016-02-14 NOTE — Telephone Encounter (Signed)
Last OV 12/03/15 Alprazolam last filled 10/16/15 #30 with 3

## 2016-02-17 NOTE — Telephone Encounter (Signed)
Medication filled to pharmacy as requested.   

## 2016-02-20 DIAGNOSIS — R7989 Other specified abnormal findings of blood chemistry: Secondary | ICD-10-CM | POA: Diagnosis not present

## 2016-02-20 DIAGNOSIS — Z79899 Other long term (current) drug therapy: Secondary | ICD-10-CM | POA: Diagnosis not present

## 2016-02-20 DIAGNOSIS — R5383 Other fatigue: Secondary | ICD-10-CM | POA: Diagnosis not present

## 2016-02-20 DIAGNOSIS — R000 Tachycardia, unspecified: Secondary | ICD-10-CM | POA: Diagnosis not present

## 2016-02-20 DIAGNOSIS — R748 Abnormal levels of other serum enzymes: Secondary | ICD-10-CM | POA: Diagnosis not present

## 2016-02-20 DIAGNOSIS — R110 Nausea: Secondary | ICD-10-CM | POA: Diagnosis not present

## 2016-02-20 DIAGNOSIS — F17210 Nicotine dependence, cigarettes, uncomplicated: Secondary | ICD-10-CM | POA: Diagnosis not present

## 2016-02-20 DIAGNOSIS — H16009 Unspecified corneal ulcer, unspecified eye: Secondary | ICD-10-CM | POA: Diagnosis not present

## 2016-02-20 DIAGNOSIS — I10 Essential (primary) hypertension: Secondary | ICD-10-CM | POA: Diagnosis not present

## 2016-02-20 DIAGNOSIS — R072 Precordial pain: Secondary | ICD-10-CM | POA: Diagnosis not present

## 2016-02-22 DIAGNOSIS — R079 Chest pain, unspecified: Secondary | ICD-10-CM | POA: Diagnosis not present

## 2016-02-23 ENCOUNTER — Other Ambulatory Visit: Payer: Self-pay | Admitting: Family Medicine

## 2016-03-14 ENCOUNTER — Other Ambulatory Visit: Payer: Self-pay | Admitting: Family Medicine

## 2016-03-16 NOTE — Telephone Encounter (Signed)
Last OV 12/03/15 Ibuprofen last filled 07/31/15 #30 with 5

## 2016-03-16 NOTE — Telephone Encounter (Signed)
Medication filled to pharmacy as requested.   

## 2016-03-27 ENCOUNTER — Other Ambulatory Visit: Payer: Self-pay | Admitting: Family Medicine

## 2016-03-30 DIAGNOSIS — H01024 Squamous blepharitis left upper eyelid: Secondary | ICD-10-CM | POA: Diagnosis not present

## 2016-03-30 DIAGNOSIS — H01021 Squamous blepharitis right upper eyelid: Secondary | ICD-10-CM | POA: Diagnosis not present

## 2016-03-30 DIAGNOSIS — H04123 Dry eye syndrome of bilateral lacrimal glands: Secondary | ICD-10-CM | POA: Diagnosis not present

## 2016-03-30 DIAGNOSIS — H16001 Unspecified corneal ulcer, right eye: Secondary | ICD-10-CM | POA: Diagnosis not present

## 2016-03-31 ENCOUNTER — Ambulatory Visit (INDEPENDENT_AMBULATORY_CARE_PROVIDER_SITE_OTHER): Payer: BLUE CROSS/BLUE SHIELD | Admitting: Family Medicine

## 2016-03-31 ENCOUNTER — Encounter: Payer: Self-pay | Admitting: Family Medicine

## 2016-03-31 VITALS — BP 126/82 | HR 98 | Temp 98.1°F | Resp 17 | Ht 61.0 in | Wt 202.0 lb

## 2016-03-31 DIAGNOSIS — B182 Chronic viral hepatitis C: Secondary | ICD-10-CM

## 2016-03-31 DIAGNOSIS — A530 Latent syphilis, unspecified as early or late: Secondary | ICD-10-CM

## 2016-03-31 NOTE — Progress Notes (Signed)
   Subjective:    Patient ID: Vanessa [name], female    DOB: [birth year], 55 y.o.   MRN: [id number]  HPI Lab f/u- pt went to eye doctor yesterday and had + RPR, FTA-ABS, Hep C ab, Hep B core ab, Hep A reactive.  Pt completed Harvoni tx w/ Dr Luciana Axeomer and was told that she would always have + antibodies.  Pt was to follow up in [facility] in July.  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  Hep B labs are consistent w/ prior infxn.     Review of Systems For ROS see HPI     Objective:   Physical Exam  Constitutional: She is oriented to person, place, and time. She appears well-developed and well-nourished. No distress.  obese  Neurological: She is alert and oriented to person, place, and time.  Skin: Skin is warm and dry.  No jaundice  Psychiatric: She has a normal mood and affect. Her behavior is normal. Thought content normal.  Vitals reviewed.         Assessment & Plan:  Hep C- pt completed Harvoni tx w/ Dr Luciana Axeomer.  Never returned to his office as directed for final f/u after medication was completed.  Will refer back today for evaluation.  Hep [facility]- [facility], pt's + surface ab, core ab but negative surface antigen is consistent w/ past infxn and it is not clear whether additional work up is needed at this time.  Will defer to Dr Luciana Axeomer and Hepatitis Clinic at this time  Hep A- pt had at least 1 of 2 vaccines of the series which may explain her + titers.  She is currently asymptomatic.  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  Will repeat labs today and treat if indicated.  Will also mention this to Dr Luciana Axeomer as he is the expert in ID issues.  Pt expressed understanding and is in agreement w/ plan.

## 2016-03-31 NOTE — Patient Instructions (Signed)
Schedule your complete physical for March We'll notify you of your lab results and make any changes if needed We'll call you with your appt w/ Dr Luciana Axeomer for Hep B/C follow-up Continue to work on healthy diet and regular exercise- you can do it! Call with any questions or concerns Hang in there!  We'll figure this out!

## 2016-03-31 NOTE — Addendum Note (Signed)
Addended by: Con MemosMOORE, Ananda Caya S on: 03/31/2016 09:45 AM   Modules accepted: Orders

## 2016-03-31 NOTE — Progress Notes (Signed)
Pre visit review using our clinic review tool, if applicable. No additional management support is needed unless otherwise documented below in the visit note. 

## 2016-04-01 LAB — RPR

## 2016-04-01 LAB — FLUORESCENT TREPONEMAL AB(FTA)-IGG-BLD: FLUORESCENT TREPONEMAL ABS: NONREACTIVE

## 2016-04-08 ENCOUNTER — Telehealth: Payer: Self-pay | Admitting: *Deleted

## 2016-04-08 NOTE — Telephone Encounter (Signed)
Called patient and left a voice mail for her to call Dr. Ephriam Knucklesomer's office for a follow up appt to reassess for Hep C treatment. Wendall MolaJacqueline Cockerham

## 2016-04-11 ENCOUNTER — Other Ambulatory Visit: Payer: Self-pay | Admitting: Family Medicine

## 2016-04-13 ENCOUNTER — Ambulatory Visit: Payer: BLUE CROSS/BLUE SHIELD | Admitting: Family Medicine

## 2016-04-13 NOTE — Telephone Encounter (Signed)
Medication filled to pharmacy as requested.   

## 2016-04-13 NOTE — Telephone Encounter (Signed)
Last OV 03/31/16 Alprazolam last filled 02/17/16 #30 with 1

## 2016-04-14 ENCOUNTER — Emergency Department (HOSPITAL_BASED_OUTPATIENT_CLINIC_OR_DEPARTMENT_OTHER)
Admission: EM | Admit: 2016-04-14 | Discharge: 2016-04-14 | Disposition: A | Discharge status: Home / Self Care | Payer: BLUE CROSS/BLUE SHIELD | Attending: Emergency Medicine | Admitting: Emergency Medicine

## 2016-04-14 ENCOUNTER — Emergency Department (HOSPITAL_BASED_OUTPATIENT_CLINIC_OR_DEPARTMENT_OTHER): Payer: BLUE CROSS/BLUE SHIELD

## 2016-04-14 ENCOUNTER — Encounter (HOSPITAL_BASED_OUTPATIENT_CLINIC_OR_DEPARTMENT_OTHER): Payer: Self-pay

## 2016-04-14 ENCOUNTER — Ambulatory Visit (HOSPITAL_BASED_OUTPATIENT_CLINIC_OR_DEPARTMENT_OTHER)
Admit: 2016-04-14 | Discharge: 2016-04-14 | Disposition: A | Discharge status: Home / Self Care | Payer: BLUE CROSS/BLUE SHIELD | Attending: Emergency Medicine | Admitting: Emergency Medicine

## 2016-04-14 DIAGNOSIS — Z79899 Other long term (current) drug therapy: Secondary | ICD-10-CM | POA: Insufficient documentation

## 2016-04-14 DIAGNOSIS — M660 Rupture of popliteal cyst: Secondary | ICD-10-CM | POA: Insufficient documentation

## 2016-04-14 DIAGNOSIS — M7989 Other specified soft tissue disorders: Secondary | ICD-10-CM | POA: Insufficient documentation

## 2016-04-14 DIAGNOSIS — M79604 Pain in right leg: Secondary | ICD-10-CM | POA: Diagnosis not present

## 2016-04-14 DIAGNOSIS — F17210 Nicotine dependence, cigarettes, uncomplicated: Secondary | ICD-10-CM | POA: Diagnosis not present

## 2016-04-14 DIAGNOSIS — Z791 Long term (current) use of non-steroidal anti-inflammatories (NSAID): Secondary | ICD-10-CM | POA: Diagnosis not present

## 2016-04-14 DIAGNOSIS — I10 Essential (primary) hypertension: Secondary | ICD-10-CM | POA: Insufficient documentation

## 2016-04-14 LAB — TROPONIN I: Troponin I: <0.03 ng/mL (ref <0.03)

## 2016-04-14 LAB — CBC WITH DIFFERENTIAL/PLATELET
Basophils Absolute: 0 10*3/uL (ref 0.0–0.1)
Basophils Relative: 0 %
EOS PCT: 1 %
Eosinophils Absolute: 0.1 10*3/uL (ref 0.0–0.7)
HCT: 40.2 % (ref 36.0–46.0)
HEMOGLOBIN: 13.2 g/dL (ref 12.0–15.0)
LYMPHS ABS: 2.5 10*3/uL (ref 0.7–4.0)
LYMPHS PCT: 22 %
MCH: 29.1 pg (ref 26.0–34.0)
MCHC: 32.8 g/dL (ref 30.0–36.0)
MCV: 88.7 fL (ref 78.0–100.0)
MONOS PCT: 5 %
Monocytes Absolute: 0.5 10*3/uL (ref 0.1–1.0)
Neutro Abs: 8.3 10*3/uL — ABNORMAL HIGH (ref 1.7–7.7)
Neutrophils Relative %: 72 %
PLATELETS: 235 10*3/uL (ref 150–400)
RBC: 4.53 MIL/uL (ref 3.87–5.11)
RDW: 15.1 % (ref 11.5–15.5)
WBC: 11.4 10*3/uL — AB (ref 4.0–10.5)

## 2016-04-14 LAB — BASIC METABOLIC PANEL
Anion gap: 11 (ref 5–15)
BUN: 21 mg/dL — AB (ref 6–20)
CHLORIDE: 102 mmol/L (ref 101–111)
CO2: 24 mmol/L (ref 22–32)
Calcium: 8.8 mg/dL — ABNORMAL LOW (ref 8.9–10.3)
Creatinine, Ser: 0.72 mg/dL (ref 0.44–1.00)
GFR calc Af Amer: >60 mL/min (ref >60)
GFR calc non Af Amer: >60 mL/min (ref >60)
GLUCOSE: 120 mg/dL — AB (ref 65–99)
POTASSIUM: 3.4 mmol/L — AB (ref 3.5–5.1)
SODIUM: 137 mmol/L (ref 135–145)

## 2016-04-14 LAB — D-DIMER, QUANTITATIVE: D-Dimer, Quant: 0.51 ug/mL-FEU — ABNORMAL HIGH (ref 0.00–0.50)

## 2016-04-14 MED ORDER — KETOROLAC TROMETHAMINE 15 MG/ML IJ SOLN
INTRAMUSCULAR | Status: AC
  Administered 2016-04-14: 15 mg
  Filled 2016-04-14: qty 1

## 2016-04-14 NOTE — ED Provider Notes (Addendum)
D-dimer is very mildly elevated. Patient asked to be discharged to return at 9 AM for ultrasound to rule out DVT. Understands need to return immediately for worsening pain, swelling, chest pain, shortness of breath or any concerns.   Loren Raceravid Latifa Noble, MD 04/14/16 0730   Patient has ultrasound performed. No evidence of DVT. Patient does have a Baker cyst with possible rupture. We will wrap with Ace for support. Advised RICE and follow-up with sports medicine. Return precautions given.   Loren Raceravid Trever Streater, MD 04/14/16 1113

## 2016-04-14 NOTE — ED Triage Notes (Signed)
Pt c/o pain and swelling to RLE x1day, states had one episode this am with center chest pain lasting 3-114mins

## 2016-04-14 NOTE — ED Provider Notes (Signed)
[facility] Provider Note: Vanessa [name] [name], MD, FACEP  CSN: [id number] MRN: [id number] ARRIVAL: 04/14/16 at 0542 ROOM: MH01/[facility]01   CHIEF COMPLAINT  Leg Pain   HISTORY OF PRESENT ILLNESS  Vanessa [name] is a 55 y.o. female who developed pain and swelling in her right lower leg yesterday morning. Her pain is progressed and is now "very bad". Pain is worse with palpation, movement or ambulation. The pain is primarily located in her right calf. The pain is a deep dull pain. About an hour prior to arrival she had the sudden onset of a sharp central chest pain. This was severe enough to have her in tears. Nothing made the pain better or worse. It was not pleuritic. There was no associated shortness of breath, nausea or diaphoresis. The pain lasted about 4 minutes and then abated on its own. She denies fever, chills, vomiting or diarrhea. She has noticed some wheezing. She is a smoker.   Past Medical History:  Diagnosis Date  . Anxiety   . Chronic knee pain   . Depression   . Gall stone   . Hepatitis C   . Hypertension   . Syphilis     Past Surgical History:  Procedure Laterality Date  . CARPAL TUNNEL RELEASE Left   . CHOLECYSTECTOMY N/A 11/24/2014   Procedure: LAPAROSCOPIC CHOLECYSTECTOMY WITH INTRAOPERATIVE CHOLANGIOGRAM;  Surgeon: Gaynelle AduEric Wilson, MD;  Location: [facility];  Service: General;  Laterality: N/A;  . ECTOPIC PREGNANCY SURGERY      Family History  Problem Relation Age of Onset  . Hypertension Mother   . Diabetes Mother   . Drug abuse Brother   . Esophageal cancer Brother   . Colon cancer Brother   . Hypertension Sister     x's 2  . Diabetes Sister     oldest sister  . Stomach cancer Neg Hx     Social History  Substance Use Topics  . Smoking status: Current Every Day Smoker    Packs/day: 1.00    Years: 35.00    Types: Cigarettes    Start date: 05/25/1994  . Smokeless tobacco: Never Used  . Alcohol use No     Comment: "last alcohol was in the 1990's"      Prior to Admission medications   Medication Sig Start Date End Date Taking? Authorizing Provider  albuterol (PROVENTIL HFA;VENTOLIN HFA) 108 (90 BASE) MCG/ACT inhaler Inhale 2 puffs into the lungs every 6 (six) hours as needed for wheezing or shortness of breath. 04/06/14   Carollee Leitzarrie M Doss, NP  ALPRAZolam Prudy Feeler(XANAX) 0.5 MG tablet TAKE 1 TABLET EVERY DAY AS NEEDED 04/13/16   Sheliah HatchKatherine E Tabori, MD  benazepril-hydrochlorthiazide (LOTENSIN HCT) 10-12.5 MG tablet TAKE 1 TABLET BY MOUTH DAILY. 02/24/16   Sheliah HatchKatherine E Tabori, MD  ergocalciferol (VITAMIN D2) 50000 units capsule Take 1 capsule (50,000 Units total) by mouth once a week. 08/05/15   Sheliah HatchKatherine E Tabori, MD  furosemide (LASIX) 20 MG tablet Take 1 tablet (20 mg total) by mouth daily. 01/28/16   Sheliah HatchKatherine E Tabori, MD  ibuprofen (ADVIL,MOTRIN) 800 MG tablet TAKE 1 TABLET THREE TIMES A DAY AS NEEDED 03/16/16   Sheliah HatchKatherine E Tabori, MD  Ledipasvir-Sofosbuvir (HARVONI) 90-400 MG TABS Take 1 tablet by mouth daily. 04/30/15   Gardiner Barefootobert W Comer, MD  predniSONE (DELTASONE) 20 MG tablet Take 10 mg by mouth.  03/20/16 04/19/16  Historical Provider, MD  SUBOXONE 8-2 MG FILM Place 0.5 strips under the tongue 2 (two) times daily. 04/18/13  Historical Provider, MD  venlafaxine XR (EFFEXOR-XR) 150 MG 24 hr capsule TAKE 1 CAPSULE (150 MG TOTAL) BY MOUTH DAILY WITH BREAKFAST. 03/27/16   Sheliah HatchKatherine E Tabori, MD    Allergies Patient has no known allergies.   REVIEW OF SYSTEMS  Negative except as noted here or in the History of Present Illness.   PHYSICAL EXAMINATION  Initial Vital Signs Blood pressure 140/90, pulse 89, temperature 98.3 F (36.8 C), temperature source Oral, resp. rate 18, height 5\' 1"  (1.549 m), weight 190 lb (86.2 kg), SpO2 96 %.  Examination General: Well-developed, well-nourished female in no acute distress; appearance consistent with age of record HENT: normocephalic; atraumatic Eyes: pupils equal, round and reactive to light; extraocular  muscles intact Neck: supple Heart: regular rate and rhythm; no murmur Lungs: Faint end expiratory wheezes Abdomen: soft; nondistended; nontender; bowel sounds present Extremities: No deformity; full range of motion except right lower extremity due to pain and swelling; pulses normal; swelling and tenderness of right lower leg, tenderness is primarily located in the calf Neurologic: Awake, alert and oriented; motor function intact in all extremities and symmetric; no facial droop Skin: Warm and dry Psychiatric: Normal mood and affect   RESULTS  Summary of this visit's results, reviewed by myself:   EKG Interpretation  Date/Time:  Tuesday April 14 2016 06:02:49 EST Ventricular Rate:  77 PR Interval:    QRS Duration: 88 QT Interval:  385 QTC Calculation: 436 R Axis:   54 Text Interpretation:  Sinus rhythm Normal ECG No significant change was found Confirmed by [name]  MD, Jonny RuizJOHN ([id number]) on 04/14/2016 6:20:48 AM      Laboratory Studies: Results for orders placed or performed during the hospital encounter of 04/14/16 (from the past 24 hour(s))  Troponin I     Status: None   Collection Time: 04/14/16  6:25 AM  Result Value Ref Range   Troponin I <0.03 <0.03 ng/mL  Basic metabolic panel     Status: Abnormal   Collection Time: 04/14/16  6:25 AM  Result Value Ref Range   Sodium 137 135 - 145 mmol/L   Potassium 3.4 (L) 3.5 - 5.1 mmol/L   Chloride 102 101 - 111 mmol/L   CO2 24 22 - 32 mmol/L   Glucose, Bld 120 (H) 65 - 99 mg/dL   BUN 21 (H) 6 - 20 mg/dL   Creatinine, Ser 1.190.72 0.44 - 1.00 mg/dL   Calcium 8.8 (L) 8.9 - 10.3 mg/dL   GFR calc non Af Amer >60 >60 mL/min   GFR calc Af Amer >60 >60 mL/min   Anion gap 11 5 - 15  CBC with Differential/Platelet     Status: Abnormal   Collection Time: 04/14/16  6:50 AM  Result Value Ref Range   WBC 11.4 (H) 4.0 - 10.5 K/uL   RBC 4.53 3.87 - 5.11 MIL/uL   Hemoglobin 13.2 12.0 - 15.0 g/dL   HCT 14.740.2 82.936.0 - 56.246.0 %   MCV 88.7 78.0 -  100.0 fL   MCH 29.1 26.0 - 34.0 pg   MCHC 32.8 30.0 - 36.0 g/dL   RDW 13.015.1 86.511.5 - 78.415.5 %   Platelets 235 150 - 400 K/uL   Neutrophils Relative % 72 %   Neutro Abs 8.3 (H) 1.7 - 7.7 K/uL   Lymphocytes Relative 22 %   Lymphs Abs 2.5 0.7 - 4.0 K/uL   Monocytes Relative 5 %   Monocytes Absolute 0.5 0.1 - 1.0 K/uL   Eosinophils Relative 1 %  Eosinophils Absolute 0.1 0.0 - 0.7 K/uL   Basophils Relative 0 %   Basophils Absolute 0.0 0.0 - 0.1 K/uL   Imaging Studies: US Venous Img Lower Unilateral Right  Result Date: 04/14/2016 CLINICAL DATA:  Right popliteal fossa and medial posterior calf pain for 1 day. EXAM: RIGHT LOWER EXTREMITY VENOUS DOPPLER ULTRASOUND TECHNIQUE: Gray-scale sonography with graded compression, as well as color Doppler and duplex ultrasound, were performed to evaluate the deep venous system from the level of the common femoral vein through the popliteal and proximal calf veins. Spectral Doppler was utilized to evaluate flow at rest and with distal augmentation maneuvers. COMPARISON:  None. FINDINGS: Left common femoral vein is patent without thrombus. Normal compressibility, augmentation and color Doppler flow in the right common femoral vein, right femoral vein and right popliteal vein. The right saphenofemoral junction is patent. Right profunda femoral vein is patent without thrombus. Visualized right deep calf veins are patent without thrombus. Visualized right great saphenous vein demonstrates normal compressibility without thrombus. There is a heterogeneous hypoechoic structure extending from the right popliteal fossa down the right calf. This structure measures 16.5 x 2.3 x 6.5 cm. Findings are compatible with a complex fluid collection. IMPRESSION: Negative for deep venous thrombosis in right lower extremity. Complex fluid collection in the popliteal fossa and medial right calf. Findings are suggestive for a Baker's cyst which may have ruptured. Electronically Signed   By:  Richarda Overlie M.D.   On: 04/14/2016 09:59    ED COURSE  Nursing notes and initial vitals signs, including pulse oximetry, reviewed.  Vitals:   04/14/16 0553 04/14/16 0554 04/14/16 0738  BP: 140/90  137/95  Pulse: 89  87  Resp: 18  16  Temp: 98.3 F (36.8 C)    TempSrc: Oral    SpO2: 96%  98%  Weight:  190 lb (86.2 kg)   Height:  5\' 1"  (1.549 m)     PROCEDURES    ED DIAGNOSES     ICD-9-CM ICD-10-CM   1. Ruptured Bakers cyst 727.51 M66.0   2. Right leg pain 729.5 M79.604        Paula Libra, MD 04/15/16 1053

## 2016-04-15 ENCOUNTER — Emergency Department (HOSPITAL_COMMUNITY)
Admission: EM | Admit: 2016-04-15 | Discharge: 2016-04-15 | Disposition: A | Discharge status: Home / Self Care | Payer: BLUE CROSS/BLUE SHIELD | Attending: Emergency Medicine | Admitting: Emergency Medicine

## 2016-04-15 ENCOUNTER — Encounter (HOSPITAL_COMMUNITY): Payer: Self-pay | Admitting: *Deleted

## 2016-04-15 DIAGNOSIS — Z791 Long term (current) use of non-steroidal anti-inflammatories (NSAID): Secondary | ICD-10-CM | POA: Insufficient documentation

## 2016-04-15 DIAGNOSIS — R200 Anesthesia of skin: Secondary | ICD-10-CM | POA: Insufficient documentation

## 2016-04-15 DIAGNOSIS — F17210 Nicotine dependence, cigarettes, uncomplicated: Secondary | ICD-10-CM | POA: Insufficient documentation

## 2016-04-15 DIAGNOSIS — I10 Essential (primary) hypertension: Secondary | ICD-10-CM | POA: Diagnosis not present

## 2016-04-15 DIAGNOSIS — Z79899 Other long term (current) drug therapy: Secondary | ICD-10-CM | POA: Insufficient documentation

## 2016-04-15 DIAGNOSIS — M7989 Other specified soft tissue disorders: Secondary | ICD-10-CM | POA: Insufficient documentation

## 2016-04-15 DIAGNOSIS — M79604 Pain in right leg: Secondary | ICD-10-CM

## 2016-04-15 NOTE — ED Provider Notes (Signed)
[facility]-EMERGENCY DEPT Provider Note   CSN: [id number] Arrival date & time: 04/15/16  [id number]     History   Chief Complaint Chief Complaint  Patient presents with  . Leg Pain    HPI Vanessa [name] is a 55 y.o. female.  Patient was seen yesterday for leg swelling. She had no ultrasound of her leg that did not show any DVT but did show a ruptured Baker's cyst. Patient states the swelling is not improving.    Leg Pain   This is a new problem. The current episode started more than 2 days ago. The problem occurs constantly. The problem has not changed since onset.The pain is present in the right lower leg. The quality of the pain is described as aching. The pain is at a severity of 3/10. The pain is mild. Associated symptoms include numbness. The symptoms are aggravated by standing. She has tried nothing for the symptoms. The treatment provided no relief.    Past Medical History:  Diagnosis Date  . Anxiety   . Chronic knee pain   . Depression   . Gall stone   . Hepatitis C   . Hypertension   . Syphilis     Patient Active Problem List   Diagnosis Date Noted  . Edema 12/03/2015  . Liver fibrosis (HCC) 07/25/2015  . Acute frontal sinusitis 03/04/2015  . Corneal ulcers and infections 02/01/2015  . Uveitis 02/01/2015  . S/P laparoscopic cholecystectomy 11/25/2014  . Knee pain, right 07/17/2014  . Physical exam 07/17/2014  . Pap smear for cervical cancer screening 07/17/2014  . Abdominal pain, right upper quadrant 06/21/2014  . Acute bronchitis 04/06/2014  . Wheezing 04/06/2014  . Other and unspecified hyperlipidemia 01/12/2014  . Chest pain 01/05/2014  . Smokers' cough (HCC) 08/07/2013  . Chronic hepatitis C (HCC) 04/27/2013  . 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 Hypertension   . Depression     Past Surgical History:  Procedure Laterality Date  . CARPAL TUNNEL RELEASE Left   . CHOLECYSTECTOMY N/A 11/24/2014   Procedure: LAPAROSCOPIC CHOLECYSTECTOMY WITH  INTRAOPERATIVE CHOLANGIOGRAM;  Surgeon: Gaynelle AduEric Wilson, MD;  Location: [facility];  Service: General;  Laterality: N/A;  . ECTOPIC PREGNANCY SURGERY      OB History    No data available       Home Medications    Prior to Admission medications   Medication Sig Start Date End Date Taking? Authorizing Provider  albuterol (PROVENTIL HFA;VENTOLIN HFA) 108 (90 BASE) MCG/ACT inhaler Inhale 2 puffs into the lungs every 6 (six) hours as needed for wheezing or shortness of breath. 04/06/14   Carollee Leitzarrie M Doss, NP  ALPRAZolam Prudy Feeler(XANAX) 0.5 MG tablet TAKE 1 TABLET EVERY DAY AS NEEDED 04/13/16   Sheliah HatchKatherine E Tabori, MD  benazepril-hydrochlorthiazide (LOTENSIN HCT) 10-12.5 MG tablet TAKE 1 TABLET BY MOUTH DAILY. 02/24/16   Sheliah HatchKatherine E Tabori, MD  ergocalciferol (VITAMIN D2) 50000 units capsule Take 1 capsule (50,000 Units total) by mouth once a week. 08/05/15   Sheliah HatchKatherine E Tabori, MD  furosemide (LASIX) 20 MG tablet Take 1 tablet (20 mg total) by mouth daily. 01/28/16   Sheliah HatchKatherine E Tabori, MD  ibuprofen (ADVIL,MOTRIN) 800 MG tablet TAKE 1 TABLET THREE TIMES A DAY AS NEEDED 03/16/16   Sheliah HatchKatherine E Tabori, MD  Ledipasvir-Sofosbuvir (HARVONI) 90-400 MG TABS Take 1 tablet by mouth daily. 04/30/15   Gardiner Barefootobert W Comer, MD  predniSONE (DELTASONE) 20 MG tablet Take 10 mg by mouth.  03/20/16 04/19/16  Historical Provider, MD  SUBOXONE 8-2  MG FILM Place 0.5 strips under the tongue 2 (two) times daily. 04/18/13   Historical Provider, MD  venlafaxine XR (EFFEXOR-XR) 150 MG 24 hr capsule TAKE 1 CAPSULE (150 MG TOTAL) BY MOUTH DAILY WITH BREAKFAST. 03/27/16   Sheliah Hatch, MD    Family History Family History  Problem Relation Age of Onset  . Hypertension Mother   . Diabetes Mother   . Drug abuse Brother   . Esophageal cancer Brother   . Colon cancer Brother   . Hypertension Sister     x's 2  . Diabetes Sister     oldest sister  . Stomach cancer Neg Hx     Social History Social History  Substance Use Topics  . Smoking  status: Current Every Day Smoker    Packs/day: 1.00    Years: 35.00    Types: Cigarettes    Start date: 05/25/1994  . Smokeless tobacco: Never Used  . Alcohol use No     Comment: "last alcohol was in the 1990's"     Allergies   Patient has no known allergies.   Review of Systems Review of Systems  Constitutional: Negative for appetite change and fatigue.  HENT: Negative for congestion, ear discharge and sinus pressure.   Eyes: Negative for discharge.  Respiratory: Negative for cough.   Cardiovascular: Negative for chest pain.  Gastrointestinal: Negative for abdominal pain and diarrhea.  Genitourinary: Negative for frequency and hematuria.  Musculoskeletal: Negative for back pain.       Swollen right leg  Skin: Negative for rash.  Neurological: Positive for numbness. Negative for seizures and headaches.  Psychiatric/Behavioral: Negative for hallucinations.     Physical Exam Updated Vital Signs BP 146/92 (BP Location: Left Arm)   Pulse 102   Temp 97.9 F (36.6 C) (Oral)   Resp 18   SpO2 96%   Physical Exam  Constitutional: She is oriented to person, place, and time. She appears well-developed.  HENT:  Head: Normocephalic.  Eyes: Conjunctivae are normal.  Neck: No tracheal deviation present.  Cardiovascular:  No murmur heard. Musculoskeletal: Normal range of motion.  Patient has swelling of her calf neurovascular exam normal mild tenderness posterior  Neurological: She is oriented to person, place, and time.  Skin: Skin is warm.  Psychiatric: She has a normal mood and affect.     ED Treatments / Results  Labs (all labs ordered are listed, but only abnormal results are displayed) Labs Reviewed - No data to display  EKG  EKG Interpretation None       Radiology US Venous Img Lower Unilateral Right  Result Date: 04/14/2016 CLINICAL DATA:  Right popliteal fossa and medial posterior calf pain for 1 day. EXAM: RIGHT LOWER EXTREMITY VENOUS DOPPLER  ULTRASOUND TECHNIQUE: Gray-scale sonography with graded compression, as well as color Doppler and duplex ultrasound, were performed to evaluate the deep venous system from the level of the common femoral vein through the popliteal and proximal calf veins. Spectral Doppler was utilized to evaluate flow at rest and with distal augmentation maneuvers. COMPARISON:  None. FINDINGS: Left common femoral vein is patent without thrombus. Normal compressibility, augmentation and color Doppler flow in the right common femoral vein, right femoral vein and right popliteal vein. The right saphenofemoral junction is patent. Right profunda femoral vein is patent without thrombus. Visualized right deep calf veins are patent without thrombus. Visualized right great saphenous vein demonstrates normal compressibility without thrombus. There is a heterogeneous hypoechoic structure extending from the right popliteal fossa down  the right calf. This structure measures 16.5 x 2.3 x 6.5 cm. Findings are compatible with a complex fluid collection. IMPRESSION: Negative for deep venous thrombosis in right lower extremity. Complex fluid collection in the popliteal fossa and medial right calf. Findings are suggestive for a Baker's cyst which may have ruptured. Electronically Signed   By: Richarda OverlieAdam  Henn M.D.   On: 04/14/2016 09:59    Procedures Procedures (including critical care time)  Medications Ordered in ED Medications - No data to display   Initial Impression / Assessment and Plan / ED Course  I have reviewed the triage vital signs and the nursing notes.  Pertinent labs & imaging results that were available during my care of the patient were reviewed by me and considered in my medical decision making (see chart for details).  Clinical Course     Patient with right leg swelling most likely related to ruptured Baker's cyst. She will keep her leg elevated and follow-up Monday with orthopedics  Final Clinical Impressions(s) / ED  Diagnoses   Final diagnoses:  None    New Prescriptions New Prescriptions   No medications on file     Bethann BerkshireJoseph Naheim Burgen, MD 04/15/16 1025

## 2016-04-15 NOTE — ED Triage Notes (Addendum)
Patient presents with right posterior leg pain behind right knee and posterior right calf as well as swelling since Monday of this week.  Patient had an venous US yesterday at [facility].  No DVT was noted, but reports indicates she may have a ruptured Baker's Cyst behind right knee.  Patient has taken Marlin CanaryGoody Powder for pain with no relief.  Pt has an appt on Monday with Norton BlizzardShane Hudnall on Sports Medicine.  Patient would like us to "drain" her leg.    Patient was informed by PCP several years ago that she has arthritis in both knees.  Patient denies SOB, chest pain and dizziness.

## 2016-04-15 NOTE — Discharge Instructions (Signed)
Follow-up as planned Monday keep your leg elevated

## 2016-04-20 ENCOUNTER — Encounter: Payer: Self-pay | Admitting: Family Medicine

## 2016-04-20 ENCOUNTER — Ambulatory Visit (INDEPENDENT_AMBULATORY_CARE_PROVIDER_SITE_OTHER): Payer: BLUE CROSS/BLUE SHIELD | Admitting: Family Medicine

## 2016-04-20 VITALS — BP 114/77 | HR 102 | Ht 61.0 in | Wt 192.0 lb

## 2016-04-20 DIAGNOSIS — M25561 Pain in right knee: Secondary | ICD-10-CM | POA: Diagnosis not present

## 2016-04-20 MED ORDER — METHYLPREDNISOLONE ACETATE 40 MG/ML IJ SUSP
40.0000 mg | Freq: Once | INTRAMUSCULAR | Status: AC
  Administered 2016-04-20: 40 mg via INTRA_ARTICULAR

## 2016-04-20 NOTE — Patient Instructions (Signed)
You have a knee effusion and baker's cyst in this knee that has ruptured. Ice the knee and calf as often as possible 15 minutes at a time. ACE wraps or compression stocking (not a sleeve) over the whole area - consider medical grade stockings (you can get these at a medical supply store - about 20-30 mm mercury pressure). Ibuprofen 600mg  three times a day with food OR aleve 2 tabs twice a day with food for pain and inflammation. Elevate above the level of your heart as much as possible. Make sure you do ankle motion exercises or calf raises to help push the fluid out as well. Follow up with me in 1 month for reevaluation.

## 2016-04-21 NOTE — Progress Notes (Signed)
PCP: Vanessa RhymesKatherine Tabori, MD  Subjective:   HPI: Patient is a 55 y.o. female here for right knee, leg pain.  Patient reports about 2 weeks ago she developed pain, swelling in right knee anterior and posteriorly. Worsened swelling and pain down into calf since that time. Has been taking ibuprofen and using goody powders. Pain 5/10, sharp and stiff. Tried elevating. Doppler u/s negative for DVT but showed large bakers cyst dissecting into calf muscle, suspicious for rupture. No acute injury or trauma. No skin changes, numbness.  Past Medical History:  Diagnosis Date  . Anxiety   . Chronic knee pain   . Depression   . Gall stone   . Hepatitis C   . Hypertension   . Syphilis     Current Outpatient Prescriptions on File Prior to Visit  Medication Sig Dispense Refill  . albuterol (PROVENTIL HFA;VENTOLIN HFA) 108 (90 BASE) MCG/ACT inhaler Inhale 2 puffs into the lungs every 6 (six) hours as needed for wheezing or shortness of breath. 1 Inhaler 2  . ALPRAZolam (XANAX) 0.5 MG tablet TAKE 1 TABLET EVERY DAY AS NEEDED 30 tablet 1  . benazepril-hydrochlorthiazide (LOTENSIN HCT) 10-12.5 MG tablet TAKE 1 TABLET BY MOUTH DAILY. 90 tablet 1  . ergocalciferol (VITAMIN D2) 50000 units capsule Take 1 capsule (50,000 Units total) by mouth once a week. 12 capsule 0  . furosemide (LASIX) 20 MG tablet Take 1 tablet (20 mg total) by mouth daily. 90 tablet 1  . ibuprofen (ADVIL,MOTRIN) 800 MG tablet TAKE 1 TABLET THREE TIMES A DAY AS NEEDED 30 tablet 5  . Ledipasvir-Sofosbuvir (HARVONI) 90-400 MG TABS Take 1 tablet by mouth daily. 28 tablet 2  . SUBOXONE 8-2 MG FILM Place 0.5 strips under the tongue 2 (two) times daily.    Marland Kitchen. venlafaxine XR (EFFEXOR-XR) 150 MG 24 hr capsule TAKE 1 CAPSULE (150 MG TOTAL) BY MOUTH DAILY WITH BREAKFAST. 90 capsule 0   No current facility-administered medications on file prior to visit.     Past Surgical History:  Procedure Laterality Date  . CARPAL TUNNEL RELEASE Left    . CHOLECYSTECTOMY N/A 11/24/2014   Procedure: LAPAROSCOPIC CHOLECYSTECTOMY WITH INTRAOPERATIVE CHOLANGIOGRAM;  Surgeon: Gaynelle AduEric Wilson, MD;  Location: [facility];  Service: General;  Laterality: N/A;  . ECTOPIC PREGNANCY SURGERY      No Known Allergies  Social History   Social History  . Marital status: Single    Spouse name: N/A  . Number of children: 1  . Years of education: N/A   Occupational History  . Dialysis Tech    Social History Main Topics  . Smoking status: Current Every Day Smoker    Packs/day: 1.00    Years: 35.00    Types: Cigarettes    Start date: 05/25/1994  . Smokeless tobacco: Never Used  . Alcohol use No     Comment: "last alcohol was in the 1990's"  . Drug use: No     Comment: "last drug use was in the 1990's; never used heroin"  . Sexual activity: Not Currently   Other Topics Concern  . Not on file   Social History Narrative   Works at [employer] [place] Lives with daughter.    Family History  Problem Relation Age of Onset  . Hypertension Mother   . Diabetes Mother   . Drug abuse Brother   . Esophageal cancer Brother   . Colon cancer Brother   . Hypertension Sister     x's 2  . Diabetes Sister  oldest sister  . Stomach cancer Neg Hx     BP 114/77   Pulse (!) 102   Ht 5\' 1"  (1.549 m)   Wt 192 lb (87.1 kg)   BMI 36.28 kg/m   Review of Systems: See HPI above.     Objective:  Physical Exam:  Gen: NAD, comfortable in exam room  Right knee: Mild effusion.  No bruising.  Swelling felt proximal calf, popliteal fossa as well.  No skin changes, other deformity. TTP popliteal fossa, proximal calf.  No joint line or other tenderness. ROM 0 - 120 degrees. Negative ant/post drawers. Negative valgus/varus testing. Negative lachmanns. Negative mcmurrays, apleys, patellar apprehension. NV intact distally.  MSK u/s:  Joint effusion confirmed.  Large baker's cyst popliteal fossa with extension to about 1/3rd of the way down lower leg.  Most  with echogenicity suggesting blood instead of anechoic nature of pure cyst.   Assessment & Plan:  1. Right knee pain - with effusion, large baker's cyst that appears to have ruptured.  Underlying etiology likely arthritis leading to effusion and cyst that has since ruptured.  We discussed conservative treatment vs aspiration/injection of knee or cyst.  She would like to try conservative treatment and joint injection.  Small aspiration of fluid was serosanguinous in joint.  Icing, elevation, compression, ibuprofen/aleve.  F/u in 1 month.  Stressed importance of calf exercises also.  After informed written consent, patient was lying supine on exam table. Right knee was prepped with alcohol swab and utilizing superolateral approach with ultrasound guidance, patient's right knee was injected intraarticularly with 3:1 marcaine: depomedrol. Patient tolerated the procedure well without immediate complications.

## 2016-04-21 NOTE — Assessment & Plan Note (Signed)
with effusion, large baker's cyst that appears to have ruptured.  Underlying etiology likely arthritis leading to effusion and cyst that has since ruptured.  We discussed conservative treatment vs aspiration/injection of knee or cyst.  She would like to try conservative treatment and joint injection.  Small aspiration of fluid was serosanguinous in joint.  Icing, elevation, compression, ibuprofen/aleve.  F/u in 1 month.  Stressed importance of calf exercises also.  After informed written consent, patient was lying supine on exam table. Right knee was prepped with alcohol swab and utilizing superolateral approach with ultrasound guidance, patient's right knee was injected intraarticularly with 3:1 marcaine: depomedrol. Patient tolerated the procedure well without immediate complications.

## 2016-05-14 ENCOUNTER — Ambulatory Visit (INDEPENDENT_AMBULATORY_CARE_PROVIDER_SITE_OTHER): Payer: BLUE CROSS/BLUE SHIELD | Admitting: Family Medicine

## 2016-05-14 ENCOUNTER — Encounter: Payer: Self-pay | Admitting: Family Medicine

## 2016-05-14 DIAGNOSIS — M25561 Pain in right knee: Secondary | ICD-10-CM

## 2016-05-14 NOTE — Patient Instructions (Signed)
You have a knee effusion and baker's cyst in this knee that has ruptured. Consider switching to heat now for 15 minutes at a time 3-4 times a day. ACE wraps or compression stocking (not a sleeve) over the whole area - consider medical grade stockings (you can get these at a medical supply store - about 20-30 mm mercury pressure). We will refer you for possible treatment with Unna boots or other measures for the extreme swelling here.   Ibuprofen 600mg  three times a day with food OR aleve 2 tabs twice a day with food for pain and inflammation only if needed. Elevate above the level of your heart as much as possible. Make sure you do ankle motion exercises or calf raises to help push the fluid out as well. Follow up with me in 1 month for reevaluation.

## 2016-05-21 ENCOUNTER — Ambulatory Visit (HOSPITAL_BASED_OUTPATIENT_CLINIC_OR_DEPARTMENT_OTHER)
Admission: RE | Admit: 2016-05-21 | Discharge: 2016-05-21 | Disposition: A | Discharge status: Home / Self Care | Payer: BLUE CROSS/BLUE SHIELD | Source: Ambulatory Visit | Attending: Family Medicine | Admitting: Family Medicine

## 2016-05-21 ENCOUNTER — Encounter: Payer: Self-pay | Admitting: General Practice

## 2016-05-21 ENCOUNTER — Ambulatory Visit (INDEPENDENT_AMBULATORY_CARE_PROVIDER_SITE_OTHER): Payer: BLUE CROSS/BLUE SHIELD | Admitting: Family Medicine

## 2016-05-21 ENCOUNTER — Encounter: Payer: Self-pay | Admitting: Family Medicine

## 2016-05-21 VITALS — BP 121/83 | HR 79 | Temp 97.8°F | Resp 16 | Ht 61.0 in | Wt 202.5 lb

## 2016-05-21 DIAGNOSIS — M7121 Synovial cyst of popliteal space [Baker], right knee: Secondary | ICD-10-CM | POA: Diagnosis not present

## 2016-05-21 DIAGNOSIS — M79604 Pain in right leg: Secondary | ICD-10-CM | POA: Diagnosis not present

## 2016-05-21 DIAGNOSIS — R600 Localized edema: Secondary | ICD-10-CM

## 2016-05-21 MED ORDER — FUROSEMIDE 40 MG PO TABS: 40.0000 mg | ORAL_TABLET | Freq: Every day | ORAL | 3 refills | Status: DC

## 2016-05-21 NOTE — Progress Notes (Signed)
Pre visit review using our clinic review tool, if applicable. No additional management support is needed unless otherwise documented below in the visit note. 

## 2016-05-21 NOTE — Progress Notes (Signed)
PCP: Neena RhymesKatherine Tabori, MD  Subjective:   HPI: Patient is a 55 y.o. female here for right knee, leg pain.  11/27: Patient reports about 2 weeks ago she developed pain, swelling in right knee anterior and posteriorly. Worsened swelling and pain down into calf since that time. Has been taking ibuprofen and using goody powders. Pain 5/10, sharp and stiff. Tried elevating. Doppler u/s negative for DVT but showed large bakers cyst dissecting into calf muscle, suspicious for rupture. No acute injury or trauma. No skin changes, numbness.  12/21: Patient continues to struggle with pain at 8/10 level at times. Believes the shot helped her some. Doing compression, home exercises. Taking ibuprofen and icing. No other skin changes, numbness.  Past Medical History:  Diagnosis Date  . Anxiety   . Chronic knee pain   . Depression   . Gall stone   . Hepatitis C   . Hypertension   . Syphilis     Current Outpatient Prescriptions on File Prior to Visit  Medication Sig Dispense Refill  . albuterol (PROVENTIL HFA;VENTOLIN HFA) 108 (90 BASE) MCG/ACT inhaler Inhale 2 puffs into the lungs every 6 (six) hours as needed for wheezing or shortness of breath. 1 Inhaler 2  . ALPRAZolam (XANAX) 0.5 MG tablet TAKE 1 TABLET EVERY DAY AS NEEDED 30 tablet 1  . benazepril-hydrochlorthiazide (LOTENSIN HCT) 10-12.5 MG tablet TAKE 1 TABLET BY MOUTH DAILY. 90 tablet 1  . ergocalciferol (VITAMIN D2) 50000 units capsule Take 1 capsule (50,000 Units total) by mouth once a week. 12 capsule 0  . ibuprofen (ADVIL,MOTRIN) 800 MG tablet TAKE 1 TABLET THREE TIMES A DAY AS NEEDED 30 tablet 5  . Ledipasvir-Sofosbuvir (HARVONI) 90-400 MG TABS Take 1 tablet by mouth daily. 28 tablet 2  . SUBOXONE 8-2 MG FILM Place 0.5 strips under the tongue 2 (two) times daily.    Marland Kitchen. venlafaxine XR (EFFEXOR-XR) 150 MG 24 hr capsule TAKE 1 CAPSULE (150 MG TOTAL) BY MOUTH DAILY WITH BREAKFAST. 90 capsule 0   No current facility-administered  medications on file prior to visit.     Past Surgical History:  Procedure Laterality Date  . CARPAL TUNNEL RELEASE Left   . CHOLECYSTECTOMY N/A 11/24/2014   Procedure: LAPAROSCOPIC CHOLECYSTECTOMY WITH INTRAOPERATIVE CHOLANGIOGRAM;  Surgeon: Gaynelle AduEric Wilson, MD;  Location: [facility];  Service: General;  Laterality: N/A;  . ECTOPIC PREGNANCY SURGERY      No Known Allergies  Social History   Social History  . Marital status: Single    Spouse name: N/A  . Number of children: 1  . Years of education: N/A   Occupational History  . Dialysis Tech    Social History Main Topics  . Smoking status: Current Every Day Smoker    Packs/day: 1.00    Years: 35.00    Types: Cigarettes    Start date: 05/25/1994  . Smokeless tobacco: Never Used  . Alcohol use No     Comment: "last alcohol was in the 1990's"  . Drug use: No     Comment: "last drug use was in the 1990's; never used heroin"  . Sexual activity: Not Currently   Other Topics Concern  . Not on file   Social History Narrative   Works at [employer] [place]. Lives with daughter.    Family History  Problem Relation Age of Onset  . Hypertension Mother   . Diabetes Mother   . Drug abuse Brother   . Esophageal cancer Brother   . Colon cancer Brother   .  Hypertension Sister     x's 2  . Diabetes Sister     oldest sister  . Stomach cancer Neg Hx     BP (!) 135/91   Pulse 96   Ht 5\' 1"  (1.549 m)   Wt 196 lb (88.9 kg)   BMI 37.03 kg/m   Review of Systems: See HPI above.     Objective:  Physical Exam:  Gen: NAD, comfortable in exam room  Right knee: Mild effusion.  No bruising.  Swelling felt proximal calf, popliteal fossa as well.  No skin changes, other deformity. TTP popliteal fossa, proximal calf.  No joint line or other tenderness. ROM 0 - 120 degrees. Negative ant/post drawers. Negative valgus/varus testing. Negative lachmanns. Negative mcmurrays, apleys, patellar apprehension. NV intact distally. 2+ pitting  edema lower leg.  MSK u/s:  Still with joint effusion and large baker's cyst - Joint effusion confirmed.  Large baker's cyst popliteal fossa with extension to about 1/3rd of the way down lower leg.  Minimal portions of this now anechoic.   Assessment & Plan:  1. Right knee pain - with effusion, large baker's cyst that has ruptured, is hemorrhagic and appears to be clotting off.  Underlying etiology likely arthritis leading to effusion and cyst that has since ruptured.  S/p injection.  Continues to struggle with pain, swelling.  Will go ahead with referral to consider Unna boot treatment.  Continue compression, switch to heat.  NSAIDs if needed.  Continue HEP.  F/u in 1 month for reevaluation.

## 2016-05-21 NOTE — Patient Instructions (Signed)
We'll let you know about your ultrasound results as soon as they're available We'll call you with your Ortho appt Increase the Lasix to 40mg  daily (2 of what you have- the new prescription is at the pharmacy for 1 pill daily) Drink plenty of fluids Heat and elevate the leg!!! Call with any questions or concerns Hang in there!! Happy New Year!!!

## 2016-05-21 NOTE — Assessment & Plan Note (Addendum)
Deteriorated.  Pt's R lower leg is markedly swollen to just above knee.  Very TTP.  Pt had venous doppler in late November that was (-) for DVT but given the marked swelling and TTP, must again assess.  Increase Lasix to 40mg  daily to improve swelling.  She has know Baker's cyst which may be the cause and if that's the case, will need orthopedic evaluation for possible drainage.  Pt expressed understanding and is in agreement w/ plan.

## 2016-05-21 NOTE — Assessment & Plan Note (Signed)
with effusion, large baker's cyst that has ruptured, is hemorrhagic and appears to be clotting off.  Underlying etiology likely arthritis leading to effusion and cyst that has since ruptured.  S/p injection.  Continues to struggle with pain, swelling.  Will go ahead with referral to consider Unna boot treatment.  Continue compression, switch to heat.  NSAIDs if needed.  Continue HEP.  F/u in 1 month for reevaluation.

## 2016-05-21 NOTE — Progress Notes (Signed)
   Subjective:    Patient ID: Vanessa HakimSusie Scheirer, female    DOB: 1960-07-17, 55 y.o.   MRN: [id number]  HPI Edema- pt has gained 6 lbs since 12/21.  Pt has hx of ruptured Baker's cyst behind R knee and she reports 'it won't go down'.  Pt went to ER in November for similar and had (-) doppler for DVT.  No CP, SOB, HAs, visual changes   Review of Systems For ROS see HPI     Objective:   Physical Exam  Constitutional: She is oriented to person, place, and time. She appears well-developed and well-nourished. No distress.  HENT:  Head: Normocephalic and atraumatic.  Cardiovascular: Normal rate, regular rhythm, normal heart sounds and intact distal pulses.   Pulmonary/Chest: Effort normal and breath sounds normal. No respiratory distress. She has no wheezes. She has no rales.  Musculoskeletal: She exhibits edema (tense, nonpitting edema of R LE, very TTP) and tenderness.  Neurological: She is alert and oriented to person, place, and time.  Skin: Skin is warm and dry. No rash noted. No erythema.  Vitals reviewed.         Assessment & Plan:

## 2016-05-21 NOTE — Assessment & Plan Note (Signed)
New to provider, ongoing for pt.  She has been to ER and seen Sports Med w/o relief.  She is interested in definitive tx due to ongoing pain and swelling.  Repeat US and refer to Ortho.  Pt expressed understanding and is in agreement w/ plan.

## 2016-05-22 ENCOUNTER — Other Ambulatory Visit: Payer: Self-pay | Admitting: Family Medicine

## 2016-05-22 DIAGNOSIS — M7121 Synovial cyst of popliteal space [Baker], right knee: Secondary | ICD-10-CM

## 2016-05-26 ENCOUNTER — Other Ambulatory Visit: Payer: Self-pay | Admitting: Family Medicine

## 2016-05-26 ENCOUNTER — Ambulatory Visit: Payer: BLUE CROSS/BLUE SHIELD | Admitting: Family Medicine

## 2016-05-26 ENCOUNTER — Telehealth: Payer: Self-pay | Admitting: Family Medicine

## 2016-05-26 DIAGNOSIS — M7121 Synovial cyst of popliteal space [Baker], right knee: Secondary | ICD-10-CM

## 2016-05-26 NOTE — Telephone Encounter (Signed)
[facility] is calling to ask that we amend the referral to say "aspiration and injection". She states that their rad techs require the order to state that

## 2016-05-27 ENCOUNTER — Other Ambulatory Visit: Payer: Self-pay | Admitting: Family Medicine

## 2016-05-27 NOTE — Telephone Encounter (Signed)
This was already ordered and resigned by PCP yesterday.

## 2016-05-28 ENCOUNTER — Ambulatory Visit
Admission: RE | Admit: 2016-05-28 | Discharge: 2016-05-28 | Disposition: A | Discharge status: Home / Self Care | Payer: BLUE CROSS/BLUE SHIELD | Source: Ambulatory Visit | Attending: Family Medicine | Admitting: Family Medicine

## 2016-05-28 DIAGNOSIS — M7121 Synovial cyst of popliteal space [Baker], right knee: Secondary | ICD-10-CM

## 2016-05-28 NOTE — Procedures (Signed)
Interventional Radiology Procedure Note  Procedure: US guided aspiration of right baker's cyst, with simultaneous therapeutic injection.   ~2-3cc of bloody dense fluid aspirated.    Injection of 80mg  Kenalog (1cc) and 2cc bupivicaine.   Complications: None  Recommendations:  - Routine wound care - Wound suggest formal fluoro guided knee injection if future therapy is indicated - Advised patient that low grade compression stocking may provide some symptom relief given evidence of ruptured bakers cyst into calf   Signed,  Yvone NeuJaime S. Loreta AveWagner, DO

## 2016-05-29 ENCOUNTER — Telehealth: Payer: Self-pay | Admitting: Family Medicine

## 2016-05-29 NOTE — Telephone Encounter (Signed)
Patient notified of PCP recommendations and is agreement and expresses an understanding.  She was informed that she should be called by [facility] to set up that new patient appt.

## 2016-05-29 NOTE — Telephone Encounter (Signed)
Pt states that the cyst was not able to be removed from her leg, states that is was to thick and asking what is the next step for her.

## 2016-05-29 NOTE — Telephone Encounter (Signed)
She needs to f/u w/ ortho- I referred her when she was here

## 2016-06-11 ENCOUNTER — Ambulatory Visit (INDEPENDENT_AMBULATORY_CARE_PROVIDER_SITE_OTHER): Payer: Self-pay | Admitting: Orthopaedic Surgery

## 2016-06-15 ENCOUNTER — Other Ambulatory Visit: Payer: Self-pay | Admitting: Family Medicine

## 2016-06-15 ENCOUNTER — Ambulatory Visit (INDEPENDENT_AMBULATORY_CARE_PROVIDER_SITE_OTHER): Payer: Self-pay | Admitting: Orthopaedic Surgery

## 2016-06-15 NOTE — Telephone Encounter (Signed)
Last OV 03/31/16 Alprazolam last filled 04/13/16 #30 with 1

## 2016-06-22 ENCOUNTER — Encounter (INDEPENDENT_AMBULATORY_CARE_PROVIDER_SITE_OTHER): Payer: Self-pay | Admitting: Orthopaedic Surgery

## 2016-06-22 ENCOUNTER — Ambulatory Visit (INDEPENDENT_AMBULATORY_CARE_PROVIDER_SITE_OTHER): Payer: BLUE CROSS/BLUE SHIELD | Admitting: Orthopaedic Surgery

## 2016-06-22 ENCOUNTER — Ambulatory Visit (INDEPENDENT_AMBULATORY_CARE_PROVIDER_SITE_OTHER): Payer: Self-pay

## 2016-06-22 VITALS — BP 118/86 | HR 94 | Ht 61.0 in | Wt 196.0 lb

## 2016-06-22 DIAGNOSIS — G8929 Other chronic pain: Secondary | ICD-10-CM | POA: Diagnosis not present

## 2016-06-22 DIAGNOSIS — M25561 Pain in right knee: Secondary | ICD-10-CM

## 2016-06-22 MED ORDER — LIDOCAINE HCL 1 % IJ SOLN
5.0000 mL | INTRAMUSCULAR | Status: AC | PRN
Start: 2016-06-22 — End: 2016-06-22
  Administered 2016-06-22: 5 mL

## 2016-06-22 MED ORDER — METHYLPREDNISOLONE ACETATE 40 MG/ML IJ SUSP
80.0000 mg | INTRAMUSCULAR | Status: AC | PRN
  Administered 2016-06-22: 80 mg

## 2016-06-22 MED ORDER — BUPIVACAINE HCL 0.5 % IJ SOLN
3.0000 mL | INTRAMUSCULAR | Status: AC | PRN
  Administered 2016-06-22: 3 mL via INTRA_ARTICULAR

## 2016-06-22 NOTE — Progress Notes (Signed)
Office Visit Note   Patient: Vanessa HakimSusie Mcginnity           Date of Birth: [birth year]           MRN: [id number] Visit Date: 06/22/2016              Requested by: Sheliah HatchKatherine E Tabori, MD [address] [place], [place] [address] PCP: Neena RhymesKatherine Tabori, MD   Assessment & Plan: Visit Diagnoses: right knee pain-osteoarthritis  Plan cortisone injection right knee follow-up in 2 weeks and monitor her response. Consider follow-up cortisone injections or even Visco supplementation  Follow-Up Instructions: No Follow-up on file.   Orders:  No orders of the defined types were placed in this encounter.  No orders of the defined types were placed in this encounter.     Procedures: Large Joint Inj Date/Time: 06/22/2016 4:48 PM Performed by: Valeria BatmanWHITFIELD, PETER W Authorized by: Valeria BatmanWHITFIELD, PETER W   Consent Given by:  Patient Timeout: prior to procedure the correct patient, procedure, and site was verified   Indications:  Pain and joint swelling Location:  Knee Site:  R knee Prep: patient was prepped and draped in usual sterile fashion   Needle Size:  25 G Needle Length:  1.5 inches Approach:  Anteromedial Ultrasound Guidance: No   Fluoroscopic Guidance: No   Arthrogram: No   Medications:  5 mL lidocaine 1 %; 80 mg methylPREDNISolone acetate 40 MG/ML; 3 mL bupivacaine 0.5 % Aspiration Attempted: No   Patient tolerance:  Patient tolerated the procedure well with no immediate complications     Clinical Data: No additional findings.   Subjective: Chief Complaint  Patient presents with  . Right Knee - Pain    Pt presents with Baker's cyst on Right knee. On 05/28/2016 pt presented with the following to Dr. Pearletha ForgeHudnall, DO.   Status post ultrasound-guided attempted aspiration of ruptured Baker's cyst, with very little viscous fluid aspirated.  Status post therapeutic injection into the joint space via the access into the Baker's cyst, as above.   Review of Systems Mrs Lin GivensJeffries is  presently having pain predominantly along the anterior aspect of her knee. No popliteal or posterior knee pain.  Objective: Vital Signs: Report from 05/28/2016 regarding aspiration of the Baker's cyst is attended to the chart  Physical Exam  Ortho Exam examination of the right knee reveals a very small effusion. Predominantly some patella pain and some mild medial joint discomfort. Full knee extension. Flexed approximately 100 without instability. No popliteal discomfort or mass palpated no calf pain. Her vascular exam intact distally.  Specialty Comments:  No specialty comments available.  Imaging: No results found.   PMFS History: Patient Active Problem List   Diagnosis Date Noted  . Baker's cyst of knee, right 05/21/2016  . Edema 12/03/2015  . Liver fibrosis (HCC) 07/25/2015  . Acute frontal sinusitis 03/04/2015  . Corneal ulcers and infections 02/01/2015  . Uveitis 02/01/2015  . S/P laparoscopic cholecystectomy 11/25/2014  . Knee pain, right 07/17/2014  . Physical exam 07/17/2014  . Pap smear for cervical cancer screening 07/17/2014  . Abdominal pain, right upper quadrant 06/21/2014  . Acute bronchitis 04/06/2014  . Wheezing 04/06/2014  . Other and unspecified hyperlipidemia 01/12/2014  . Chest pain 01/05/2014  . Smokers' cough (HCC) 08/07/2013  . Chronic hepatitis C (HCC) 04/27/2013  . 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 Hypertension   . Depression    Past Medical History:  Diagnosis Date  . Anxiety   . Chronic knee pain   .  Depression   . Gall stone   . Hepatitis C   . Hypertension   . Syphilis     Family History  Problem Relation Age of Onset  . Hypertension Mother   . Diabetes Mother   . Drug abuse Brother   . Esophageal cancer Brother   . Colon cancer Brother   . Hypertension Sister     x's 2  . Diabetes Sister     oldest sister  . Stomach cancer Neg Hx     Past Surgical History:  Procedure Laterality Date  . CARPAL TUNNEL RELEASE Left    . CHOLECYSTECTOMY N/A 11/24/2014   Procedure: LAPAROSCOPIC CHOLECYSTECTOMY WITH INTRAOPERATIVE CHOLANGIOGRAM;  Surgeon: Gaynelle Adu, MD;  Location: [facility];  Service: General;  Laterality: N/A;  . ECTOPIC PREGNANCY SURGERY     Social History   Occupational History  . Dialysis Tech    Social History Main Topics  . Smoking status: Current Every Day Smoker    Packs/day: 1.00    Years: 35.00    Types: Cigarettes    Start date: 05/25/1994  . Smokeless tobacco: Never Used  . Alcohol use No     Comment: "last alcohol was in the 1990's"  . Drug use: No     Comment: "last drug use was in the 1990's; never used heroin"  . Sexual activity: Not Currently

## 2016-06-24 ENCOUNTER — Other Ambulatory Visit: Payer: Self-pay | Admitting: Family Medicine

## 2016-07-06 ENCOUNTER — Ambulatory Visit (INDEPENDENT_AMBULATORY_CARE_PROVIDER_SITE_OTHER): Payer: BLUE CROSS/BLUE SHIELD | Admitting: Orthopaedic Surgery

## 2016-07-06 DIAGNOSIS — F1120 Opioid dependence, uncomplicated: Secondary | ICD-10-CM | POA: Diagnosis not present

## 2016-07-06 DIAGNOSIS — G8929 Other chronic pain: Secondary | ICD-10-CM | POA: Diagnosis not present

## 2016-07-06 DIAGNOSIS — F411 Generalized anxiety disorder: Secondary | ICD-10-CM | POA: Diagnosis not present

## 2016-07-06 DIAGNOSIS — M5417 Radiculopathy, lumbosacral region: Secondary | ICD-10-CM | POA: Diagnosis not present

## 2016-07-06 DIAGNOSIS — G894 Chronic pain syndrome: Secondary | ICD-10-CM | POA: Diagnosis not present

## 2016-07-22 DIAGNOSIS — F411 Generalized anxiety disorder: Secondary | ICD-10-CM | POA: Diagnosis not present

## 2016-07-22 DIAGNOSIS — G8929 Other chronic pain: Secondary | ICD-10-CM | POA: Diagnosis not present

## 2016-07-22 DIAGNOSIS — G894 Chronic pain syndrome: Secondary | ICD-10-CM | POA: Diagnosis not present

## 2016-07-22 DIAGNOSIS — F1120 Opioid dependence, uncomplicated: Secondary | ICD-10-CM | POA: Diagnosis not present

## 2016-07-31 ENCOUNTER — Encounter (INDEPENDENT_AMBULATORY_CARE_PROVIDER_SITE_OTHER): Payer: Self-pay | Admitting: Orthopaedic Surgery

## 2016-07-31 ENCOUNTER — Ambulatory Visit (INDEPENDENT_AMBULATORY_CARE_PROVIDER_SITE_OTHER): Payer: BLUE CROSS/BLUE SHIELD | Admitting: Orthopaedic Surgery

## 2016-07-31 VITALS — BP 114/77 | HR 84 | Ht 61.0 in | Wt 196.0 lb

## 2016-07-31 DIAGNOSIS — M25561 Pain in right knee: Secondary | ICD-10-CM

## 2016-07-31 DIAGNOSIS — G8929 Other chronic pain: Secondary | ICD-10-CM | POA: Diagnosis not present

## 2016-07-31 MED ORDER — BUPIVACAINE HCL 0.5 % IJ SOLN
3.0000 mL | INTRAMUSCULAR | Status: AC | PRN
  Administered 2016-07-31: 3 mL via INTRA_ARTICULAR

## 2016-07-31 MED ORDER — LIDOCAINE HCL 1 % IJ SOLN
5.0000 mL | INTRAMUSCULAR | Status: AC | PRN
Start: 2016-07-31 — End: 2016-07-31
  Administered 2016-07-31: 5 mL

## 2016-07-31 MED ORDER — METHYLPREDNISOLONE ACETATE 40 MG/ML IJ SUSP
80.0000 mg | INTRAMUSCULAR | Status: AC | PRN
Start: 2016-07-31 — End: 2016-07-31
  Administered 2016-07-31: 80 mg

## 2016-07-31 NOTE — Progress Notes (Signed)
Office Visit Note   Patient: Vanessa [name]           Date of Birth: [birth year]           MRN: [id number] Visit Date: 07/31/2016              Requested by: Sheliah Hatch, MD [address] [place], [place] [address] PCP: Neena Rhymes, MD   Assessment & Plan: Visit Diagnoses: 2 arthritis right knee with moderate response to cortisone injection 6 weeks ago  Plan: Repeat cortisone injection and preserved for Visco supplementation, long discussion regarding different treatment options including knee replacement.  Follow-Up Instructions: No Follow-up on file.   Orders:  No orders of the defined types were placed in this encounter.  No orders of the defined types were placed in this encounter.     Procedures: Large Joint Inj Date/Time: 07/31/2016 11:27 AM Performed by: Valeria Batman Authorized by: Valeria Batman   Consent Given by:  Patient Timeout: prior to procedure the correct patient, procedure, and site was verified   Indications:  Pain and joint swelling Location:  Knee Site:  R knee Prep: patient was prepped and draped in usual sterile fashion   Needle Size:  25 G Needle Length:  1.5 inches Approach:  Anteromedial Ultrasound Guidance: No   Fluoroscopic Guidance: No   Arthrogram: No   Medications:  5 mL lidocaine 1 %; 80 mg methylPREDNISolone acetate 40 MG/ML; 3 mL bupivacaine 0.5 % Aspiration Attempted: No   Patient tolerance:  Patient tolerated the procedure well with no immediate complications     Clinical Data: No additional findings.   Subjective: Chief Complaint  Patient presents with  . Right Knee - Pain, Edema    Pt relates she still has the Right knee pain that is becoming worse. Walking on her job makes the pain worse.  Cortisone injection at the end of January helped for a period of time only to have it recur. 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 knee will become swollen and achy   Review of  Systems   Objective: Vital Signs: There were no vitals taken for this visit.  Physical Exam  Ortho Exam mild patellar crepitation with flexion and extension. Full knee extension no effusion.Knee flexion 100. No instability. Neurovascular exam intact. Painless range of motion of both hips with internal/external rotation. Straight leg raise negative  Specialty Comments:  No specialty comments available.  Imaging: No results found.   PMFS History: Patient Active Problem List   Diagnosis Date Noted  . Baker's cyst of knee, right 05/21/2016  . Edema 12/03/2015  . Liver fibrosis (HCC) 07/25/2015  . Acute frontal sinusitis 03/04/2015  . Corneal ulcers and infections 02/01/2015  . Uveitis 02/01/2015  . S/P laparoscopic cholecystectomy 11/25/2014  . Knee pain, right 07/17/2014  . Physical exam 07/17/2014  . Pap smear for cervical cancer screening 07/17/2014  . Abdominal pain, right upper quadrant 06/21/2014  . Acute bronchitis 04/06/2014  . Wheezing 04/06/2014  . Other and unspecified hyperlipidemia 01/12/2014  . Chest pain 01/05/2014  . Smokers' cough (HCC) 08/07/2013  . Chronic hepatitis C (HCC) 04/27/2013  . 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 Hypertension   . Depression    Past Medical History:  Diagnosis Date  . Anxiety   . Chronic knee pain   . Depression   . Gall stone   . Hepatitis C   . Hypertension   .  Syphilis     Family History  Problem Relation Age of Onset  . Hypertension Mother   . Diabetes Mother   . Drug abuse Brother   . Esophageal cancer Brother   . Colon cancer Brother   . Hypertension Sister     x's 2  . Diabetes Sister     oldest sister  . Stomach cancer Neg Hx     Past Surgical History:  Procedure Laterality Date  . CARPAL TUNNEL RELEASE Left   . CHOLECYSTECTOMY N/A 11/24/2014   Procedure: LAPAROSCOPIC CHOLECYSTECTOMY WITH INTRAOPERATIVE CHOLANGIOGRAM;  Surgeon: Gaynelle AduEric Wilson, MD;  Location: [facility];  Service: General;  Laterality:  N/A;  . ECTOPIC PREGNANCY SURGERY     Social History   Occupational History  . Dialysis Tech    Social History Main Topics  . Smoking status: Current Every Day Smoker    Packs/day: 1.00    Years: 35.00    Types: Cigarettes    Start date: 05/25/1994  . Smokeless tobacco: Never Used  . Alcohol use No     Comment: "last alcohol was in the 1990's"  . Drug use: No     Comment: "last drug use was in the 1990's; never used heroin"  . Sexual activity: Not Currently

## 2016-08-03 ENCOUNTER — Telehealth (INDEPENDENT_AMBULATORY_CARE_PROVIDER_SITE_OTHER): Payer: Self-pay | Admitting: *Deleted

## 2016-08-03 NOTE — Telephone Encounter (Signed)
Left message for patient, intra articular hyaluronan injections not covered by insurance due to NOT medically necessary. Cost of Euflexxa if $555 each, right was applied for.

## 2016-08-10 ENCOUNTER — Encounter: Payer: BLUE CROSS/BLUE SHIELD | Admitting: Family Medicine

## 2016-08-12 ENCOUNTER — Other Ambulatory Visit: Payer: Self-pay | Admitting: Family Medicine

## 2016-08-12 NOTE — Telephone Encounter (Signed)
Last OV 05/21/16 Alprazolam last filled 06/15/16 #30 with 1

## 2016-08-17 ENCOUNTER — Telehealth: Payer: Self-pay | Admitting: Family Medicine

## 2016-08-17 NOTE — Telephone Encounter (Signed)
[facility] - Cli TELEPHONE ADVICE RECORD [facility]  Patient Name: Vanessa [name]  DOB: 08-11-1960    Initial Comment Having chest pains for the past week.   Nurse Assessment  Nurse: Cox, RN, [place]icon Date/Time (Eastern Time): 08/17/2016 4:22:48 PM  Confirm and document reason for call. If symptomatic, describe symptoms. ---Caller states she has had chest pain for a week, pain comes and goes. Friday the pain was constant. Took Ibuprofen Friday and pain improved. Has been taking Ibuprofen since Friday.  Does the patient have any new or worsening symptoms? ---Yes  Will a triage be completed? ---Yes  Related visit to physician within the last 2 weeks? ---No  Does the PT have any chronic conditions? (i.e. diabetes, asthma, etc.) ---Yes  List chronic conditions. ---HTN  Is this a behavioral health or substance abuse call? ---No     Guidelines    Guideline Title Affirmed Question Affirmed Notes  Chest Pain SEVERE chest pain    Final Disposition User   Go to ED Now Cox, RN, Allicon    Comments  No chest pain now  pain 9/10  Caller wants an appointment for tomorrow or Thursday, she prefers a morning appointment . Wants a call back at secondary number  NOTE: Nurse triage done by Fayette PhoAllicon Cox, RN documentation into epic and appointment made by Fabienne BrunsGail Johnson RN BSN  NOTE; Appointment made With Dr. Beverely Lowabori on Thursday at 830am 08-20-2016   Referrals  GO TO FACILITY REFUSED   Disagree/Comply: Disagree  Disagree/Comply Reason: Disagree with instructions

## 2016-08-18 ENCOUNTER — Telehealth: Payer: Self-pay | Admitting: General Practice

## 2016-08-18 NOTE — Telephone Encounter (Signed)
[facility] TELEPHONE ADVICE RECORD [facility] Patient Name: Vanessa [name] Gender: Female DOB: [birth year] Age: [age] Y 26 D Return Phone Number: [phone] (Primary), [phone] (Secondary) City[place]State[place]Zip: [place] [address] Client [facility] [place] Client [facility] Physician Lezlie Octaveabori, Kate - MD Who Is Calling Patient / Member / Family / Caregiver Call Type Triage / Clinical Relationship To Patient Self Return Phone Number [phone] (Secondary) Chief Complaint CHEST PAIN (>=21 years) - pain, pressure, heaviness or tightness Reason for Call Symptomatic / Request for Health Information Initial Comment Having chest pains for the past week. Appointment Disposition EMR Appointment Scheduled Info pasted into Epic Yes Nurse Assessment Nurse: Cox, RN, Allicon Date/Time (Eastern Time): 08/17/2016 4:22:48 PM Confirm and document reason for call. If symptomatic, describe symptoms. ---Caller states she has had chest pain for a week, pain comes and goes. Friday the pain was constant. Took Ibuprofen Friday and pain improved. Has been taking Ibuprofen since Friday. Does the PT have any chronic conditions? (i.e. diabetes, asthma, etc.) ---Yes List chronic conditions. ---HTN Guidelines Guideline Title Affirmed Question Chest Pain SEVERE chest pain Disp. Time Lamount Cohen(Eastern Time) Disposition Final User 08/17/2016 4:31:10 PM Go to ED Now Yes Cox, RN, Allicon Referrals GO TO FACILITY REFUSED Care Advice Given Per Guideline GO TO ED NOW: You need to be seen in the Emergency Department. Go to the ER at ___________ Hospital. Leave now. Drive carefully. CARE ADVICE given per Chest Pain (Adult) guideline. Comments User: Fayette PhoAllicon, Cox, RN Date/Time (Eastern Time): 08/17/2016 4:25:41 PM No chest pain now PLEASE NOTE: All timestamps contained within this report are  represented as [place] Standard Time. CONFIDENTIALTY NOTICE: This fax transmission is intended only for the addressee. It contains information that is legally privileged, confidential or otherwise protected from use or disclosure. If you are not the intended recipient, you are strictly prohibited from reviewing, disclosing, copying using or disseminating any of this information or taking any action in reliance on or regarding this information. If you have received this fax in error, please notify us immediately by telephone so that we can arrange for its return to us. Phone: [phone], Toll-Free: [phone], Fax: [phone] Page: 2 of 2 Call Id: [id number] Comments User: Fayette PhoAllicon, Cox, RN Date/Time Lamount Cohen(Eastern Time): 08/17/2016 4:26:34 PM pain 9/10 User: Fayette PhoAllicon, Cox, RN Date/Time (Eastern Time): 08/17/2016 4:30:34 PM Caller wants an appointment for tomorrow or Thursday, she prefers a morning appointment . Wants a call back at secondary number User: Fabienne BrunsGail, Johnson, RN Date/Time Lamount Cohen(Eastern Time): 08/17/2016 4:47:37 PM NOTE: Nurse triage done by Fayette PhoAllicon Cox, RN documentation into epic and appointment made by Fabienne BrunsGail Johnson RN BSN User: Fabienne BrunsGail, Johnson, RN Date/Time Lamount Cohen(Eastern Time): 08/17/2016 4:57:59 PM NOTE; Appointment made With Dr. Beverely Lowabori on Thursday at 830am 08-20-2016 User: Fayette PhoAllicon, Cox, RN Date/Time Lamount Cohen(Eastern Time): 08/17/2016 5:23:07 PM Caller informed appointmnet was scheduled for Thursday at 8:30 AM with Dr Beverely Lowabori. Caller verbalized understanding

## 2016-08-18 NOTE — Telephone Encounter (Signed)
SW patient regarding symptoms. Patient reports intermittent chest pain x 1 week. Pain is in the middle of her chest and radiates to her back. Denies SOB or diaphoresis with the episodes. Patient states Ibuprofen (1 daily) has relieved the pain and denies CP/SOB at this time. Patient is continuing to work, states she feels fine and plans to f/u with PCP on Thursday, 08/20/16. Advised if symptoms return, to go to Emergency Department or Urgent Care for evaluation. Patient verbalized understanding.

## 2016-08-20 ENCOUNTER — Ambulatory Visit (INDEPENDENT_AMBULATORY_CARE_PROVIDER_SITE_OTHER): Payer: BLUE CROSS/BLUE SHIELD | Admitting: Family Medicine

## 2016-08-20 ENCOUNTER — Encounter: Payer: Self-pay | Admitting: Family Medicine

## 2016-08-20 VITALS — BP 120/81 | HR 91 | Temp 98.2°F | Resp 17 | Ht 61.0 in | Wt 191.0 lb

## 2016-08-20 DIAGNOSIS — R079 Chest pain, unspecified: Secondary | ICD-10-CM | POA: Diagnosis not present

## 2016-08-20 NOTE — Progress Notes (Signed)
   Subjective:    Patient ID: Vanessa HakimSusie [name], female    DOB: 1961/02/21, 56 y.o.   MRN: [id number]  HPI CP- pt reports she had intermittent CP all last week.  Pt took Ibuprofen last Friday and pain improved.  She has been taking ibuprofen daily and pain has resolved completely.  No chest tightness, no SOB.  No TTP.  No palpitations.  Pt works at [employer] and admits that she may have strained something helping a patient.  Denies other heavy lifting or moving furniture.   Review of Systems For ROS see HPI     Objective:   Physical Exam  Constitutional: She is oriented to person, place, and time. She appears well-developed and well-nourished. No distress.  HENT:  Head: Normocephalic and atraumatic.  Eyes: Conjunctivae and EOM are normal. Pupils are equal, round, and reactive to light.  Neck: Normal range of motion. Neck supple. No thyromegaly present.  Cardiovascular: Normal rate, regular rhythm, normal heart sounds and intact distal pulses.   No murmur heard. Pulmonary/Chest: Effort normal and breath sounds normal. No respiratory distress. She exhibits no tenderness.  Abdominal: Soft. She exhibits no distension. There is no tenderness.  Musculoskeletal: She exhibits no edema.  Lymphadenopathy:    She has no cervical adenopathy.  Neurological: She is alert and oriented to person, place, and time.  Skin: Skin is warm and dry.  Psychiatric: She has a normal mood and affect. Her behavior is normal.  Vitals reviewed.         Assessment & Plan:

## 2016-08-20 NOTE — Progress Notes (Signed)
Pre visit review using our clinic review tool, if applicable. No additional management support is needed unless otherwise documented below in the visit note. 

## 2016-08-20 NOTE — Patient Instructions (Signed)
Schedule your complete physical in 3-4 months Keep up the good work on healthy diet and regular exercise- you look great!!! The chest pain seems to be have been a pulled muscle.  Take Ibuprofen as needed but otherwise no restrictions Call with any questions or concerns Happy Easter!!!!

## 2016-08-20 NOTE — Assessment & Plan Note (Signed)
Currently resolved.  Suspect that this is due to a musculoskeletal issue as sxs improved w/ ibuprofen.  Currently asymptomatic for almost a week.  No further work up needed at this time.  Reviewed red flags that should prompt return.  Pt expressed understanding and is in agreement w/ plan.

## 2016-08-26 ENCOUNTER — Telehealth (INDEPENDENT_AMBULATORY_CARE_PROVIDER_SITE_OTHER): Payer: Self-pay | Admitting: Orthopaedic Surgery

## 2016-08-26 NOTE — Telephone Encounter (Signed)
Patient states she had a cortisone injection the last time she was in the office and her knee is still hurting and swelling. Patient would like to know her next step. Please advise.

## 2016-08-27 NOTE — Telephone Encounter (Signed)
Please advise 

## 2016-08-27 NOTE — Telephone Encounter (Signed)
She was to consider visco...  Had asked for precert...please check

## 2016-08-27 NOTE — Telephone Encounter (Signed)
Please precert if needed

## 2016-08-28 ENCOUNTER — Telehealth (INDEPENDENT_AMBULATORY_CARE_PROVIDER_SITE_OTHER): Payer: Self-pay | Admitting: Orthopaedic Surgery

## 2016-08-28 ENCOUNTER — Telehealth (INDEPENDENT_AMBULATORY_CARE_PROVIDER_SITE_OTHER): Payer: Self-pay

## 2016-08-28 NOTE — Telephone Encounter (Signed)
LVMOM and let pt know her insurance [employer] no longer covers visco supplementation

## 2016-08-28 NOTE — Telephone Encounter (Signed)
Patient called this morning stating that behind her right knee it is burning and hurting.  She wanted to know what she can do for that.  [phone] (work) Thank you.

## 2016-08-28 NOTE — Telephone Encounter (Signed)
Sent referral 

## 2016-08-31 DIAGNOSIS — G894 Chronic pain syndrome: Secondary | ICD-10-CM | POA: Diagnosis not present

## 2016-08-31 DIAGNOSIS — F1120 Opioid dependence, uncomplicated: Secondary | ICD-10-CM | POA: Diagnosis not present

## 2016-08-31 DIAGNOSIS — G8929 Other chronic pain: Secondary | ICD-10-CM | POA: Diagnosis not present

## 2016-08-31 DIAGNOSIS — F411 Generalized anxiety disorder: Secondary | ICD-10-CM | POA: Diagnosis not present

## 2016-09-03 DIAGNOSIS — F1120 Opioid dependence, uncomplicated: Secondary | ICD-10-CM | POA: Diagnosis not present

## 2016-09-03 DIAGNOSIS — G8929 Other chronic pain: Secondary | ICD-10-CM | POA: Diagnosis not present

## 2016-09-03 DIAGNOSIS — G894 Chronic pain syndrome: Secondary | ICD-10-CM | POA: Diagnosis not present

## 2016-09-03 DIAGNOSIS — F411 Generalized anxiety disorder: Secondary | ICD-10-CM | POA: Diagnosis not present

## 2016-09-08 ENCOUNTER — Encounter (INDEPENDENT_AMBULATORY_CARE_PROVIDER_SITE_OTHER): Payer: Self-pay | Admitting: Orthopaedic Surgery

## 2016-09-08 ENCOUNTER — Other Ambulatory Visit (INDEPENDENT_AMBULATORY_CARE_PROVIDER_SITE_OTHER): Payer: Self-pay

## 2016-09-08 ENCOUNTER — Ambulatory Visit (INDEPENDENT_AMBULATORY_CARE_PROVIDER_SITE_OTHER): Payer: BLUE CROSS/BLUE SHIELD | Admitting: Orthopaedic Surgery

## 2016-09-08 VITALS — BP 131/87 | HR 94 | Ht 61.0 in | Wt 190.0 lb

## 2016-09-08 DIAGNOSIS — G8929 Other chronic pain: Secondary | ICD-10-CM | POA: Diagnosis not present

## 2016-09-08 DIAGNOSIS — M25561 Pain in right knee: Secondary | ICD-10-CM | POA: Diagnosis not present

## 2016-09-08 NOTE — Progress Notes (Signed)
Office Visit Note   Patient: Vanessa [name]           Date of Birth: 1960/08/27           MRN: [id number] Visit Date: 09/08/2016              Requested by: Sheliah Hatch, MD [address] [place], [place] [address] PCP: Neena Rhymes, MD   Assessment & Plan: Visit Diagnoses:  1. Chronic pain of right knee   Tricompartmental osteoarthritis right knee  Plan: Long discussion regarding treatment options. Her insurance will not allow Visco supplementation. She just had a cortisone injection a month ago and still having some trouble. We will order an MRI scan of her right knee and see her back after the study. Also discussed weight loss. Her BMI is 37 and weight at 192 pounds. Even if she lost 20 pounds or BMI would drop to probably 33 I also suggested she get involved in an exercise program for toning.  Follow-Up Instructions: Return after MRI.   Orders:  No orders of the defined types were placed in this encounter.  No orders of the defined types were placed in this encounter.     Procedures: No procedures performed   Clinical Data: No additional findings.   Subjective: Chief Complaint  Patient presents with  . Right Knee - Pain, Edema    Ms. Vanessa [name] is a 56 y o female that present with chronic R knee pain. On 06/22/16 she received a cortisone injection provided relief for 2 weeks. She relates her pain is becoming worse with swelling. No fever, chills takes ibuprofen  Vanessa [name] continues to have a problem with her right knee. X-rays demonstrate significant tricompartmental degenerative changes. She's had poor responses to cortisone. Her insurance company will not approve Visco supplementation.S He does have popping and cracking and some limitation of motion.  HPI  Review of Systems   Objective: Vital Signs: BP 131/87   Pulse 94   Ht  (1.549 m)   Wt 190 lb (86.2 kg)   BMI 35.90 kg/m   Physical Exam  Ortho Exam large knees. No obvious effusion  of her right knee. Full extension and 100 of flexion at which point calf touches her thigh. Bilateral nonpitting edema of both ankles and feet-chronic. No opening with varus or valgus stress. Positive patellar crepitation. Mostly medial joint line pain without grating. Popliteal pain. No calf pain. Denies any pain with range of motion of either hip. Straight leg raise negative.  Specialty Comments:  No specialty comments available.  Imaging: No results found.   PMFS History: Patient Active Problem List   Diagnosis Date Noted  . Baker's cyst of knee, right 05/21/2016  . Edema 12/03/2015  . Liver fibrosis 07/25/2015  . Acute frontal sinusitis 03/04/2015  . Corneal ulcers and infections 02/01/2015  . Uveitis 02/01/2015  . S/P laparoscopic cholecystectomy 11/25/2014  . Knee pain, right 07/17/2014  . Physical exam 07/17/2014  . Pap smear for cervical cancer screening 07/17/2014  . Abdominal pain, right upper quadrant 06/21/2014  . Acute bronchitis 04/06/2014  . Wheezing 04/06/2014  . Other and unspecified hyperlipidemia 01/12/2014  . Chest pain 01/05/2014  . Smokers' cough (HCC) 08/07/2013  . Chronic hepatitis C (HCC) 04/27/2013  . 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 Hypertension   . Depression    Past Medical History:  Diagnosis Date  . Anxiety   . Chronic knee pain   . Depression   .  Gall stone   . Hepatitis C   . Hypertension   . Syphilis     Family History  Problem Relation Age of Onset  . Hypertension Mother   . Diabetes Mother   . Drug abuse Brother   . Esophageal cancer Brother   . Colon cancer Brother   . Hypertension Sister     x's 2  . Diabetes Sister     oldest sister  . Stomach cancer Neg Hx     Past Surgical History:  Procedure Laterality Date  . CARPAL TUNNEL RELEASE Left   . CHOLECYSTECTOMY N/A 11/24/2014   Procedure: LAPAROSCOPIC CHOLECYSTECTOMY WITH INTRAOPERATIVE CHOLANGIOGRAM;  Surgeon: Gaynelle Adu, MD;  Location: [facility];  Service:  General;  Laterality: N/A;  . ECTOPIC PREGNANCY SURGERY     Social History   Occupational History  . Dialysis Tech    Social History Main Topics  . Smoking status: Current Every Day Smoker    Packs/day: 1.00    Years: 35.00    Types: E-cigarettes    Start date: 05/25/1994  . Smokeless tobacco: Never Used  . Alcohol use No     Comment: "last alcohol was in the 1990's"  . Drug use: No     Comment: "last drug use was in the 1990's; never used heroin"  . Sexual activity: Not Currently

## 2016-09-08 NOTE — Progress Notes (Deleted)
mxn

## 2016-09-18 ENCOUNTER — Other Ambulatory Visit: Payer: BLUE CROSS/BLUE SHIELD

## 2016-09-24 ENCOUNTER — Other Ambulatory Visit: Payer: Self-pay | Admitting: Family Medicine

## 2016-09-24 ENCOUNTER — Telehealth (INDEPENDENT_AMBULATORY_CARE_PROVIDER_SITE_OTHER): Payer: Self-pay | Admitting: Orthopaedic Surgery

## 2016-09-24 NOTE — Telephone Encounter (Signed)
LVMOM that patient needs to et MRI scan and it has been too early to give another injection of cortisone

## 2016-09-24 NOTE — Telephone Encounter (Signed)
Patient wants to know if it has been long enough since last knee injection that she can get another one. Please call to advise.

## 2016-09-30 NOTE — Telephone Encounter (Signed)
new

## 2016-10-01 ENCOUNTER — Ambulatory Visit
Admission: RE | Admit: 2016-10-01 | Discharge: 2016-10-01 | Disposition: A | Discharge status: Home / Self Care | Payer: BLUE CROSS/BLUE SHIELD | Source: Ambulatory Visit | Attending: Orthopaedic Surgery | Admitting: Orthopaedic Surgery

## 2016-10-01 DIAGNOSIS — M25561 Pain in right knee: Secondary | ICD-10-CM | POA: Diagnosis not present

## 2016-10-01 DIAGNOSIS — G8929 Other chronic pain: Secondary | ICD-10-CM

## 2016-10-06 ENCOUNTER — Encounter (INDEPENDENT_AMBULATORY_CARE_PROVIDER_SITE_OTHER): Payer: Self-pay | Admitting: Orthopaedic Surgery

## 2016-10-06 ENCOUNTER — Ambulatory Visit (INDEPENDENT_AMBULATORY_CARE_PROVIDER_SITE_OTHER): Payer: BLUE CROSS/BLUE SHIELD | Admitting: Orthopaedic Surgery

## 2016-10-06 DIAGNOSIS — S83241D Other tear of medial meniscus, current injury, right knee, subsequent encounter: Secondary | ICD-10-CM | POA: Diagnosis not present

## 2016-10-06 DIAGNOSIS — S83281D Other tear of lateral meniscus, current injury, right knee, subsequent encounter: Secondary | ICD-10-CM | POA: Diagnosis not present

## 2016-10-06 MED ORDER — LIDOCAINE HCL 1 % IJ SOLN
5.0000 mL | INTRAMUSCULAR | Status: AC | PRN
  Administered 2016-10-06: 5 mL

## 2016-10-06 MED ORDER — METHYLPREDNISOLONE ACETATE 40 MG/ML IJ SUSP
80.0000 mg | INTRAMUSCULAR | Status: AC | PRN
  Administered 2016-10-06: 80 mg

## 2016-10-06 MED ORDER — BUPIVACAINE HCL 0.5 % IJ SOLN
3.0000 mL | INTRAMUSCULAR | Status: AC | PRN
  Administered 2016-10-06: 3 mL via INTRA_ARTICULAR

## 2016-10-06 NOTE — Progress Notes (Signed)
Office Visit Note   Patient: Vanessa [name]           Date of Birth: 07/01/1960           MRN: [id number] Visit Date: 10/06/2016              Requested by: Vanessa Hatch, MD [address] [place], [place] [address] PCP: Vanessa Hatch, MD   Assessment & Plan: Visit Diagnoses:  1. Tear of medial meniscus of right knee, current, unspecified tear type, subsequent encounter   2. Other tear of lateral meniscus of right knee as current injury, subsequent encounter     Plan:  #1: At this time she is unable to consider a Surgical intervention because of need to gain 40 hours of PLT time. #2: At this time we will aspirate and inject the right knee to try to give her some relief in her pain #3: Once she has obtained her PLT time then we will plan for arthroscopic debridement of both medial and lateral menisci with chondroplasty of the right knee. Procedures and benefits were explained to her and she is understanding.  Follow-Up Instructions: Return if symptoms worsen or fail to improve.   Orders:  Orders Placed This Encounter  Procedures  . Large Joint Injection/Arthrocentesis   No orders of the defined types were placed in this encounter.     Procedures: Large Joint Inj Date/Time: 10/06/2016 5:01 PM Performed by: Vanessa [name] Authorized by: Vanessa [name]   Consent Given by:  Patient Timeout: prior to procedure the correct patient, procedure, and site was verified   Indications:  Pain and joint swelling Location:  Knee Site:  R knee Prep: patient was prepped and draped in usual sterile fashion   Needle Size:  25 G Needle Length:  1.5 inches Approach:  Anteromedial Ultrasound Guidance: No   Fluoroscopic Guidance: No   Arthrogram: No   Medications:  5 mL lidocaine 1 %; 80 mg methylPREDNISolone acetate 40 MG/ML; 3 mL bupivacaine 0.5 % Aspiration Attempted: Yes   Aspirate amount (mL):  20 Patient tolerance:  Patient tolerated the procedure well with no  immediate complications     Clinical Data: No additional findings.   Subjective: Chief Complaint  Patient presents with  . Right Knee - Follow-up  . Follow-up    Vanessa [name] is a 56 y o female that present with chronic R knee pain. On 06/22/16 she received a cortisone injection provided relief for 2 weeks. She relates her pain is becoming worse with swelling. No fever, chills takes ibuprofen.  Vanessa [name] continues to have a problem with her right knee. X-rays demonstrate significant tricompartmental degenerative changes. She's had poor responses to cortisone. Her insurance company will not approve Visco supplementation.S He does have popping and cracking and some limitation of motion. Because of the above was felt that an MRI scan was indicated to her possible operative pathology. She comes in today for review of the MRI scan.     Review of Systems  Constitutional: Negative.   HENT: Negative.   Respiratory: Negative.   Cardiovascular: Negative.   Gastrointestinal: Negative.   Genitourinary: Negative.   Skin: Negative.   Neurological: Negative.   Hematological: Negative.   Psychiatric/Behavioral: Negative.      Objective: Vital Signs: There were no vitals taken for this visit.  Physical Exam  Constitutional: She is oriented to person, place, and time. She appears well-developed and well-nourished.  HENT:  Head: Normocephalic and atraumatic.  Eyes: EOM are normal. Pupils are equal, round, and reactive to light.  Pulmonary/Chest: Effort normal.  Neurological: She is alert and oriented to person, place, and time.  Skin: Skin is warm and dry.  Psychiatric: She has a normal mood and affect. Her behavior is normal. Judgment and thought content normal.    Ortho Exam  Today exam today reveals some mild swelling in the right knee trace to 1+ degrees. Calf is supple nontender. Range of motion from 0 to 100 of flexion. Pain to palpation along the medial and lateral joint  line. More medially painful. She does have some swelling posteriorly in the popliteal area with local tenderness to palpation.  Specialty Comments:  No specialty comments available.  Imaging: Mr Knee Right W/o Contrast  Result Date: 10/01/2016 CLINICAL DATA:  Right knee pain and swelling for 5 months. No known injury. EXAM: MRI OF THE RIGHT KNEE WITHOUT CONTRAST TECHNIQUE: Multiplanar, multisequence MR imaging of the knee was performed. No intravenous contrast was administered. COMPARISON:  Plain films right knee performed at [facility] 06/22/2016. FINDINGS: MENISCI Medial meniscus: Horizontal tear in the posterior horn reaches the meniscal undersurface. The body is severely degenerated with marked blunting along its free edge throughout. The body is extruded peripherally. Lateral meniscus: Horizontal tear in the posterior horn reaches the meniscal undersurface and extends into the posterior body. LIGAMENTS Cruciates:  Intact.  Mucoid degeneration of the ACL is noted. Collaterals:  Intact. CARTILAGE Patellofemoral: Focal full-thickness fissures in cartilage are seen in the central and medial femoral trochlea. There is some blistering of cartilage at the apex of the patella in the midpole. Medial: Markedly thinned throughout with associated joint space narrowing. Lateral:  Thinned and irregular throughout. Joint:  Moderate to moderately large joint effusion. Popliteal Fossa: Baker's cyst measures approximately 3 cm AP by 1.6 cm transverse by 3.3 cm craniocaudal. Extensor Mechanism:  Intact. Bones: Marked subchondral edema is present about the medial compartment. Small focus of subchondral edema is also seen in the weight-bearing surface of the lateral femoral condyle. Tricompartmental osteophytes are noted. Other: None. IMPRESSION: Dominant finding is advanced for age osteoarthritis about the knee appearing worst in the medial compartment. Horizontal tears in the posterior horns and posterior bodies  of both the medial and lateral menisci. The body of the medial meniscus is severely degenerated and diminutive. Moderate to moderately large joint effusion. Baker's cyst. Electronically Signed   By: Drusilla Kanner M.[name].   On: 10/01/2016 10:38    PMFS History: Patient Active Problem List   Diagnosis Date Noted  . Baker's cyst of knee, right 05/21/2016  . Edema 12/03/2015  . Liver fibrosis 07/25/2015  . Acute frontal sinusitis 03/04/2015  . Corneal ulcers and infections 02/01/2015  . Uveitis 02/01/2015  . S/P laparoscopic cholecystectomy 11/25/2014  . Knee pain, right 07/17/2014  . Physical exam 07/17/2014  . Pap smear for cervical cancer screening 07/17/2014  . Abdominal pain, right upper quadrant 06/21/2014  . Acute bronchitis 04/06/2014  . Wheezing 04/06/2014  . Other and unspecified hyperlipidemia 01/12/2014  . Chest pain 01/05/2014  . Smokers' cough (HCC) 08/07/2013  . Chronic hepatitis C (HCC) 04/27/2013  . 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 Hypertension   . Depression    Past Medical History:  Diagnosis Date  . Anxiety   . Chronic knee pain   . Depression   . Gall stone   . Hepatitis C   . Hypertension   . Syphilis     Family History  Problem Relation Age of Onset  . Hypertension Mother   . Diabetes Mother   . Drug abuse Brother   . Esophageal cancer Brother   . Colon cancer Brother   . Hypertension Sister        x's 2  . Diabetes Sister        oldest sister  . Stomach cancer Neg Hx     Past Surgical History:  Procedure Laterality Date  . CARPAL TUNNEL RELEASE Left   . CHOLECYSTECTOMY N/A 11/24/2014   Procedure: LAPAROSCOPIC CHOLECYSTECTOMY WITH INTRAOPERATIVE CHOLANGIOGRAM;  Surgeon: Gaynelle AduEric Wilson, MD;  Location: [facility] ORS;  Service: General;  Laterality: N/A;  . ECTOPIC PREGNANCY SURGERY     Social History   Occupational History  . Dialysis Tech    Social History Main Topics  . Smoking status: Current Every Day Smoker    Packs/day: 1.00     Years: 35.00    Types: E-cigarettes    Start date: 05/25/1994  . Smokeless tobacco: Never Used  . Alcohol use No     Comment: "last alcohol was in the 1990's"  . Drug use: No     Comment: "last drug use was in the 1990's; never used heroin"  . Sexual activity: Not Currently

## 2016-10-13 ENCOUNTER — Other Ambulatory Visit: Payer: Self-pay | Admitting: Family Medicine

## 2016-10-13 NOTE — Telephone Encounter (Signed)
Last OV 08/20/16 Alprazolam last filled 08/12/16 #30 with 1

## 2016-10-26 DIAGNOSIS — G8929 Other chronic pain: Secondary | ICD-10-CM | POA: Diagnosis not present

## 2016-10-26 DIAGNOSIS — F411 Generalized anxiety disorder: Secondary | ICD-10-CM | POA: Diagnosis not present

## 2016-10-26 DIAGNOSIS — F1120 Opioid dependence, uncomplicated: Secondary | ICD-10-CM | POA: Diagnosis not present

## 2016-10-26 DIAGNOSIS — G894 Chronic pain syndrome: Secondary | ICD-10-CM | POA: Diagnosis not present

## 2016-10-30 NOTE — Telephone Encounter (Signed)
Euflexxa VOB submitted online, will followup. Patient is now pending MRI Right knee.

## 2016-11-06 NOTE — Telephone Encounter (Signed)
We applied for Euflexxa for this patient.   This patient's insurance plan does NOT cover ANY gel injections.

## 2016-11-09 NOTE — Telephone Encounter (Signed)
Please call pt with that info if you have not already

## 2016-11-18 NOTE — Telephone Encounter (Signed)
Called x 2, VM will not let me leave a ,message

## 2016-11-18 NOTE — Telephone Encounter (Signed)
Vanessa [name] will you please call this patient and tell her? See messages. Thanks.

## 2016-11-26 ENCOUNTER — Emergency Department (HOSPITAL_COMMUNITY)
Admission: EM | Admit: 2016-11-26 | Discharge: 2016-11-26 | Disposition: A | Discharge status: Home / Self Care | Payer: BLUE CROSS/BLUE SHIELD | Attending: Emergency Medicine | Admitting: Emergency Medicine

## 2016-11-26 ENCOUNTER — Ambulatory Visit (INDEPENDENT_AMBULATORY_CARE_PROVIDER_SITE_OTHER): Payer: BLUE CROSS/BLUE SHIELD | Admitting: Orthopaedic Surgery

## 2016-11-26 ENCOUNTER — Encounter (HOSPITAL_COMMUNITY): Payer: Self-pay | Admitting: Emergency Medicine

## 2016-11-26 ENCOUNTER — Emergency Department (HOSPITAL_COMMUNITY): Payer: BLUE CROSS/BLUE SHIELD

## 2016-11-26 DIAGNOSIS — R2242 Localized swelling, mass and lump, left lower limb: Secondary | ICD-10-CM | POA: Diagnosis not present

## 2016-11-26 DIAGNOSIS — M79672 Pain in left foot: Secondary | ICD-10-CM | POA: Diagnosis not present

## 2016-11-26 DIAGNOSIS — F17290 Nicotine dependence, other tobacco product, uncomplicated: Secondary | ICD-10-CM | POA: Insufficient documentation

## 2016-11-26 DIAGNOSIS — I10 Essential (primary) hypertension: Secondary | ICD-10-CM | POA: Diagnosis not present

## 2016-11-26 DIAGNOSIS — Z79899 Other long term (current) drug therapy: Secondary | ICD-10-CM | POA: Diagnosis not present

## 2016-11-26 MED ORDER — MELOXICAM 7.5 MG PO TABS: 7.5000 mg | ORAL_TABLET | Freq: Every day | ORAL | 0 refills | Status: DC

## 2016-11-26 NOTE — ED Provider Notes (Signed)
[facility]-EMERGENCY DEPT Provider Note   CSN: [id number] Arrival date & time: 11/26/16  1024  By signing my name below, I, Vanessa [name], attest that this documentation has been prepared under the direction and in the presence of [name] PA-C. Electronically Signed: Thelma BargeNick [name], Scribe. 11/26/16. 11:07 AM.  History   Chief Complaint Chief Complaint  Patient presents with  . Foot Pain   The history is provided by the patient. No language interpreter was used.    HPI Comments: Vanessa [name] is a 56 y.o. female with chronic right knee pain who presents to the Emergency Department complaining of constant, gradually worsening left-sided foot pain for a few days. She has some associated swelling to the foot. The pain is worse when she walks. She has tried wearing a post op boot and taking ibuprofen and goody powder with no relief. She denies any mechanism of injury or trauma. Pt reports chronic right-sided knee pain due to arthritis and baker's cyst in the area, which has forced her to walk more on her left foot. She is working to schedule an orthopedic surgery with Dr. Cleophas DunkerWhitfield for this issue. She denies left ankle pain, fever, chills, warmth to the area, nausea, and vomiting. Pt has NKDA and no PMHx of gout. She reports a previous stress fracture on this foot 2 years ago. She states the pain feels similar to this.  Past Medical History:  Diagnosis Date  . Anxiety   . Chronic knee pain   . Depression   . Gall stone   . Hepatitis C   . Hypertension   . Syphilis     Patient Active Problem List   Diagnosis Date Noted  . Baker's cyst of knee, right 05/21/2016  . Edema 12/03/2015  . Liver fibrosis 07/25/2015  . Acute frontal sinusitis 03/04/2015  . Corneal ulcers and infections 02/01/2015  . Uveitis 02/01/2015  . S/P laparoscopic cholecystectomy 11/25/2014  . Knee pain, right 07/17/2014  . Physical exam 07/17/2014  . Pap smear for cervical cancer screening 07/17/2014  . Abdominal  pain, right upper quadrant 06/21/2014  . Acute bronchitis 04/06/2014  . Wheezing 04/06/2014  . Other and unspecified hyperlipidemia 01/12/2014  . Chest pain 01/05/2014  . Smokers' cough (HCC) 08/07/2013  . Chronic hepatitis C (HCC) 04/27/2013  . 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 Hypertension   . Depression     Past Surgical History:  Procedure Laterality Date  . CARPAL TUNNEL RELEASE Left   . CHOLECYSTECTOMY N/A 11/24/2014   Procedure: LAPAROSCOPIC CHOLECYSTECTOMY WITH INTRAOPERATIVE CHOLANGIOGRAM;  Surgeon: Gaynelle AduEric Wilson, MD;  Location: [facility] ORS;  Service: General;  Laterality: N/A;  . ECTOPIC PREGNANCY SURGERY      OB History    No data available       Home Medications    Prior to Admission medications   Medication Sig Start Date End Date Taking? Authorizing Provider  albuterol (PROVENTIL HFA;VENTOLIN HFA) 108 (90 BASE) MCG/ACT inhaler Inhale 2 puffs into the lungs every 6 (six) hours as needed for wheezing or shortness of breath. 04/06/14   Carollee Leitzoss, Carrie M, NP  ALPRAZolam Prudy Feeler(XANAX) 0.5 MG tablet TAKE 1 TABLET BY MOUTH EVERY DAY AS NEEDED 10/13/16   Sheliah Hatchabori, Katherine E, MD  benazepril-hydrochlorthiazide (LOTENSIN HCT) 10-12.5 MG tablet TAKE 1 TABLET BY MOUTH DAILY. 09/24/16   Sheliah Hatchabori, Katherine E, MD  furosemide (LASIX) 40 MG tablet Take 1 tablet (40 mg total) by mouth daily. 05/21/16   Sheliah Hatchabori, Katherine E, MD  ibuprofen (ADVIL,MOTRIN) 800 MG tablet  TAKE 1 TABLET THREE TIMES A DAY AS NEEDED 03/16/16   Sheliah Hatch, MD  meloxicam (MOBIC) 7.5 MG tablet Take 1 tablet (7.5 mg total) by mouth daily. 11/26/16   Adric Wrede, PA-C  SUBOXONE 8-2 MG FILM Place 0.5 strips under the tongue 2 (two) times daily. 04/18/13   [provider]  venlafaxine XR (EFFEXOR-XR) 150 MG 24 hr capsule TAKE 1 CAPSULE (150 MG TOTAL) BY MOUTH DAILY WITH BREAKFAST. 09/24/16   Sheliah Hatch, MD    Family History Family History  Problem Relation Age of Onset  . Hypertension Mother   .  Diabetes Mother   . Drug abuse Brother   . Esophageal cancer Brother   . Colon cancer Brother   . Hypertension Sister        x's 2  . Diabetes Sister        oldest sister  . Stomach cancer Neg Hx     Social History Social History  Substance Use Topics  . Smoking status: Current Every Day Smoker    Packs/day: 1.00    Years: 35.00    Types: E-cigarettes    Start date: 05/25/1994  . Smokeless tobacco: Never Used  . Alcohol use No     Comment: "last alcohol was in the 1990's"     Allergies   Patient has no known allergies.   Review of Systems Review of Systems  Constitutional: Negative for chills and fever.  Gastrointestinal: Negative for nausea and vomiting.  Musculoskeletal: Positive for arthralgias and joint swelling.     Physical Exam Updated Vital Signs BP (!) 124/91   Pulse 84   Temp 97.8 F (36.6 C) (Oral)   Resp 18   SpO2 95%   Physical Exam  Constitutional: She appears well-developed and well-nourished. No distress.  HENT:  Head: Normocephalic and atraumatic.  Eyes: Conjunctivae and EOM are normal. No scleral icterus.  Neck: Normal range of motion.  Pulmonary/Chest: Effort normal. No respiratory distress.  Musculoskeletal:       Feet:  TTP of the indicated areas, no visible bruising, deformity, or temperature change noted 2+ DP pulse Strength 5/5 in BLE Full active and passive ROM at the digits and ankle No ankle tenderness  Neurological: She is alert.  Skin: No rash noted. She is not diaphoretic.  Psychiatric: She has a normal mood and affect.  Nursing note and vitals reviewed.    ED Treatments / Results  DIAGNOSTIC STUDIES: Oxygen Saturation is 95% on RA, normal by my interpretation.    COORDINATION OF CARE: 10:53 AM Discussed treatment plan with pt at bedside and pt agreed to plan.  Labs (all labs ordered are listed, but only abnormal results are displayed) Labs Reviewed - No data to display  EKG  EKG Interpretation None        Radiology Dg Foot Complete Left  Result Date: 11/26/2016 CLINICAL DATA:  Left anterior foot pain x2 days EXAM: LEFT FOOT - COMPLETE 3+ VIEW COMPARISON:  07/16/2012 FINDINGS: No fracture or dislocation is seen. The joint spaces are preserved. Mild dorsal soft tissue swelling. Small plantar calcaneal enthesophyte. IMPRESSION: No acute osseus abnormality is seen. Electronically Signed   By: Charline Bills M.D.   On: 11/26/2016 11:22    Procedures Procedures (including critical care time)  Medications Ordered in ED Medications - No data to display   Initial Impression / Assessment and Plan / ED Course  I have reviewed the triage vital signs and the nursing notes.  Pertinent labs &  imaging results that were available during my care of the patient were reviewed by me and considered in my medical decision making (see chart for details).     Patient presents to ED for left foot pain that began 2 days ago. She reports right knee pain due to arthritis and Baker's cyst and she is having to put all of her weight on her left leg and foot. She denies any injury or trauma to the area but states that she had a previous stress fracture in this foot 2 years ago. On physical exam there is tenderness to palpation below the digits but no visible temperature, color change or bruising. Area intact to light touch with good pulses and strength. She has full active and passive range of motion of the digits. She is ambulatory. Low suspicion for septic joint or gout based on appearance and history. X-ray was obtained and was negative for fracture or dislocation of the foot. I advised patient to continue wearing the postop boot and will give anti-inflammatory to help with pain. I encouraged her to follow up with her orthopedist that she can schedule for knee surgery so that she can stop putting weight on her left leg. Patient agreed to plan and will be discharged at this time. Strict return precautions  given.  Final Clinical Impressions(s) / ED Diagnoses   Final diagnoses:  Foot pain, left    New Prescriptions New Prescriptions   MELOXICAM (MOBIC) 7.5 MG TABLET    Take 1 tablet (7.5 mg total) by mouth daily.   I personally performed the services described in this documentation, which was scribed in my presence. The recorded information has been reviewed and is accurate.     Dietrich Pates, PA-C 11/26/16 1140    Doug Sou, MD 11/26/16 [phone]

## 2016-11-26 NOTE — Discharge Instructions (Signed)
Taking Mobic once daily as directed. Follow-up with orthopedist for further evaluation. Wear postop boot as indicated as much as possible for stabilization. Return to ED for worsening pain, additional injury, temperature change of foot, numbness, weakness or trouble walking.

## 2016-11-26 NOTE — ED Triage Notes (Signed)
Pt c/o left foot pain x 2 days, states she has been bearing weight unevenly onto left foot due to osteoarthritis with Baker's cyst to right knee. No injury. Pain worse with weightbearing. 2+ pedal pulse. Motor function and sensation intact. Wearing orthotic boot leftover from left left stress fracture 2 years ago.

## 2016-12-04 ENCOUNTER — Ambulatory Visit (INDEPENDENT_AMBULATORY_CARE_PROVIDER_SITE_OTHER): Payer: BLUE CROSS/BLUE SHIELD | Admitting: Family Medicine

## 2016-12-04 ENCOUNTER — Encounter: Payer: Self-pay | Admitting: Family Medicine

## 2016-12-04 VITALS — BP 122/83 | HR 82 | Temp 98.0°F | Resp 16 | Ht 61.0 in | Wt 190.2 lb

## 2016-12-04 DIAGNOSIS — E559 Vitamin D deficiency, unspecified: Secondary | ICD-10-CM | POA: Diagnosis not present

## 2016-12-04 DIAGNOSIS — Z1231 Encounter for screening mammogram for malignant neoplasm of breast: Secondary | ICD-10-CM

## 2016-12-04 DIAGNOSIS — Z0000 Encounter for general adult medical examination without abnormal findings: Secondary | ICD-10-CM | POA: Diagnosis not present

## 2016-12-04 DIAGNOSIS — I10 Essential (primary) hypertension: Secondary | ICD-10-CM | POA: Diagnosis not present

## 2016-12-04 LAB — CBC WITH DIFFERENTIAL/PLATELET
BASOS PCT: 0 %
Basophils Absolute: 0 cells/uL (ref 0–200)
EOS ABS: 89 {cells}/uL (ref 15–500)
Eosinophils Relative: 1 %
HCT: 40.9 % (ref 35.0–45.0)
Hemoglobin: 13.4 g/dL (ref 11.7–15.5)
LYMPHS PCT: 35 %
Lymphs Abs: 3115 cells/uL (ref 850–3900)
MCH: 28.3 pg (ref 27.0–33.0)
MCHC: 32.8 g/dL (ref 32.0–36.0)
MCV: 86.5 fL (ref 80.0–100.0)
MONO ABS: 356 {cells}/uL (ref 200–950)
MPV: 11.2 fL (ref 7.5–12.5)
Monocytes Relative: 4 %
NEUTROS ABS: 5340 {cells}/uL (ref 1500–7800)
Neutrophils Relative %: 60 %
Platelets: 282 10*3/uL (ref 140–400)
RBC: 4.73 MIL/uL (ref 3.80–5.10)
RDW: 14.9 % (ref 11.0–15.0)
WBC: 8.9 10*3/uL (ref 3.8–10.8)

## 2016-12-04 LAB — TSH: TSH: 2.17 mIU/L

## 2016-12-04 MED ORDER — ALPRAZOLAM 0.5 MG PO TABS: 0.5000 mg | ORAL_TABLET | Freq: Every day | ORAL | 1 refills | Status: DC | PRN

## 2016-12-04 NOTE — Patient Instructions (Signed)
Follow up in 6 months to recheck BP and cholesterol We'll notify you of your lab results and make any changes if needed Continue to work on healthy diet and regular exercise- you can do it!!! We'll call you with your mammo appt Call with any questions or concerns Have a great summer!!!

## 2016-12-04 NOTE — Assessment & Plan Note (Signed)
Check labs.  Replete prn. 

## 2016-12-04 NOTE — Progress Notes (Signed)
Pre visit review using our clinic review tool, if applicable. No additional management support is needed unless otherwise documented below in the visit note. 

## 2016-12-04 NOTE — Progress Notes (Signed)
   Subjective:    Patient ID: Vanessa [name], female    DOB: 12/17/1960, 56 y.o.   MRN: [id number]  HPI CPE- UTD on colonoscopy, pap.  Due for mammo.  UTD on immunizations.   Review of Systems Patient reports no vision/ hearing changes, adenopathy,fever, weight change,  persistant/recurrent hoarseness , swallowing issues, chest pain, palpitations, edema, persistant/recurrent cough, hemoptysis, dyspnea (rest/exertional/paroxysmal nocturnal), gastrointestinal bleeding (melena, rectal bleeding), abdominal pain, significant heartburn, bowel changes, GU symptoms (dysuria, hematuria, incontinence), Gyn symptoms (abnormal  bleeding, pain),  syncope, focal weakness, memory loss, numbness & tingling, skin/hair/nail changes, abnormal bruising or bleeding, anxiety, or depression.     Objective:   Physical Exam General Appearance:    Alert, cooperative, no distress, appears stated age  Head:    Normocephalic, without obvious abnormality, atraumatic  Eyes:    PERRL, conjunctiva/corneas clear, EOM's intact, fundi    benign, both eyes  Ears:    Normal TM's and external ear canals, both ears  Nose:   Nares normal, septum midline, mucosa normal, no drainage    or sinus tenderness  Throat:   Lips, mucosa, and tongue normal; teeth and gums normal  Neck:   Supple, symmetrical, trachea midline, no adenopathy;    Thyroid: no enlargement/tenderness/nodules  Back:     Symmetric, no curvature, ROM normal, no CVA tenderness  Lungs:     Clear to auscultation bilaterally, respirations unlabored  Chest Wall:    No tenderness or deformity   Heart:    Regular rate and rhythm, S1 and S2 normal, no murmur, rub   or gallop  Breast Exam:    Deferred to mammo  Abdomen:     Soft, non-tender, bowel sounds active all four quadrants,    no masses, no organomegaly  Genitalia:    Deferred  Rectal:    Extremities:   Extremities normal, atraumatic, no cyanosis or edema  Pulses:   2+ and symmetric all extremities  Skin:   Skin  color, texture, turgor normal, no rashes or lesions  Lymph nodes:   Cervical, supraclavicular, and axillary nodes normal  Neurologic:   CNII-XII intact, normal strength, sensation and reflexes    throughout          Assessment & Plan:

## 2016-12-04 NOTE — Assessment & Plan Note (Signed)
Chronic problem.  Well controlled today.  Asymptomatic.  Check labs.  No anticipated med changes.  Will follow. 

## 2016-12-04 NOTE — Assessment & Plan Note (Signed)
Pt's PE WNL w/ exception of obesity.  UTD on pap, colonoscopy.  Due for mammo- order placed.  Check labs.  Anticipatory guidance provided.

## 2016-12-05 LAB — HEPATIC FUNCTION PANEL
ALT: 18 U/L (ref 6–29)
AST: 29 U/L (ref 10–35)
Albumin: 4.3 g/dL (ref 3.6–5.1)
Alkaline Phosphatase: 61 U/L (ref 33–130)
BILIRUBIN DIRECT: 0.1 mg/dL (ref <=0.2)
BILIRUBIN INDIRECT: 0.2 mg/dL (ref 0.2–1.2)
TOTAL PROTEIN: 7.5 g/dL (ref 6.1–8.1)
Total Bilirubin: 0.3 mg/dL (ref 0.2–1.2)

## 2016-12-05 LAB — VITAMIN D 25 HYDROXY (VIT D DEFICIENCY, FRACTURES): VIT D 25 HYDROXY: 18 ng/mL — AB (ref 30–100)

## 2016-12-05 LAB — LIPID PANEL
CHOL/HDL RATIO: 4.1 ratio (ref <5.0)
Cholesterol: 199 mg/dL (ref <200)
HDL: 49 mg/dL — AB (ref >50)
LDL CALC: 117 mg/dL — AB (ref <100)
Triglycerides: 166 mg/dL — ABNORMAL HIGH (ref <150)
VLDL: 33 mg/dL — ABNORMAL HIGH (ref <30)

## 2016-12-05 LAB — BASIC METABOLIC PANEL
BUN: 18 mg/dL (ref 7–25)
CALCIUM: 9.3 mg/dL (ref 8.6–10.4)
CO2: 21 mmol/L (ref 20–31)
CREATININE: 0.74 mg/dL (ref 0.50–1.05)
Chloride: 98 mmol/L (ref 98–110)
Glucose, Bld: 81 mg/dL (ref 65–99)
Potassium: 4.5 mmol/L (ref 3.5–5.3)
SODIUM: 136 mmol/L (ref 135–146)

## 2016-12-07 ENCOUNTER — Other Ambulatory Visit: Payer: Self-pay | Admitting: General Practice

## 2016-12-07 MED ORDER — VITAMIN D (ERGOCALCIFEROL) 1.25 MG (50000 UNIT) PO CAPS: 50000.0000 [IU] | ORAL_CAPSULE | ORAL | 0 refills | Status: DC

## 2016-12-19 ENCOUNTER — Other Ambulatory Visit: Payer: Self-pay | Admitting: Family Medicine

## 2016-12-22 ENCOUNTER — Other Ambulatory Visit: Payer: Self-pay | Admitting: Family Medicine

## 2016-12-23 ENCOUNTER — Other Ambulatory Visit: Payer: Self-pay | Admitting: Family Medicine

## 2016-12-28 ENCOUNTER — Telehealth (INDEPENDENT_AMBULATORY_CARE_PROVIDER_SITE_OTHER): Payer: Self-pay | Admitting: Orthopaedic Surgery

## 2016-12-28 DIAGNOSIS — F411 Generalized anxiety disorder: Secondary | ICD-10-CM | POA: Diagnosis not present

## 2016-12-28 DIAGNOSIS — G894 Chronic pain syndrome: Secondary | ICD-10-CM | POA: Diagnosis not present

## 2016-12-28 DIAGNOSIS — F1120 Opioid dependence, uncomplicated: Secondary | ICD-10-CM | POA: Diagnosis not present

## 2016-12-28 DIAGNOSIS — G8929 Other chronic pain: Secondary | ICD-10-CM | POA: Diagnosis not present

## 2016-12-28 NOTE — Telephone Encounter (Signed)
Please advise 

## 2016-12-28 NOTE — Telephone Encounter (Signed)
Completed surgical sheet-let pt know that Francee Gentilearin will be calling

## 2016-12-28 NOTE — Telephone Encounter (Signed)
Patient ready to schedule rt knee surgery. Patient wanted to try to schedule for after 8/20 due to medication she's on. Please call to advise.

## 2016-12-28 NOTE — Telephone Encounter (Signed)
She wants to schedule soon.

## 2016-12-29 ENCOUNTER — Encounter (INDEPENDENT_AMBULATORY_CARE_PROVIDER_SITE_OTHER): Payer: Self-pay | Admitting: Orthopaedic Surgery

## 2017-01-12 ENCOUNTER — Telehealth (INDEPENDENT_AMBULATORY_CARE_PROVIDER_SITE_OTHER): Payer: Self-pay | Admitting: Orthopaedic Surgery

## 2017-01-12 NOTE — Telephone Encounter (Signed)
LVM x3 with pt to please give me a call. We are needing to cancel her surgery for this Thursday due to [facility] denied coverage.

## 2017-01-13 ENCOUNTER — Encounter (INDEPENDENT_AMBULATORY_CARE_PROVIDER_SITE_OTHER): Payer: Self-pay | Admitting: Orthopedic Surgery

## 2017-01-13 ENCOUNTER — Encounter (INDEPENDENT_AMBULATORY_CARE_PROVIDER_SITE_OTHER): Payer: Self-pay | Admitting: Orthopaedic Surgery

## 2017-01-13 ENCOUNTER — Telehealth (INDEPENDENT_AMBULATORY_CARE_PROVIDER_SITE_OTHER): Payer: Self-pay | Admitting: Orthopaedic Surgery

## 2017-01-13 ENCOUNTER — Ambulatory Visit (INDEPENDENT_AMBULATORY_CARE_PROVIDER_SITE_OTHER): Payer: BLUE CROSS/BLUE SHIELD | Admitting: Orthopedic Surgery

## 2017-01-13 VITALS — BP 149/72 | HR 82 | Ht 61.0 in | Wt 188.0 lb

## 2017-01-13 DIAGNOSIS — S83281D Other tear of lateral meniscus, current injury, right knee, subsequent encounter: Secondary | ICD-10-CM

## 2017-01-13 DIAGNOSIS — S83241D Other tear of medial meniscus, current injury, right knee, subsequent encounter: Secondary | ICD-10-CM | POA: Diagnosis not present

## 2017-01-13 DIAGNOSIS — M1711 Unilateral primary osteoarthritis, right knee: Secondary | ICD-10-CM

## 2017-01-13 MED ORDER — METHYLPREDNISOLONE ACETATE 40 MG/ML IJ SUSP
80.0000 mg | INTRAMUSCULAR | Status: AC | PRN
  Administered 2017-01-13: 80 mg

## 2017-01-13 MED ORDER — BUPIVACAINE HCL 0.5 % IJ SOLN
3.0000 mL | INTRAMUSCULAR | Status: AC | PRN
  Administered 2017-01-13: 3 mL via INTRA_ARTICULAR

## 2017-01-13 NOTE — Telephone Encounter (Signed)
Cora from [facility] called wanting to ask some questions about this pt. Please give Ivor Messier a call. She will be in the office until 4pm. [phone]

## 2017-01-13 NOTE — Progress Notes (Signed)
Office Visit Note   Patient: Vanessa [name]           Date of Birth: 24-Nov-1960           MRN: [id number] Visit Date: 01/13/2017              Requested by: Sheliah Hatch, MD [address] [place], [place] [address] PCP: Sheliah Hatch, MD   Assessment & Plan: Visit Diagnoses:  1. Tear of lateral meniscus of right knee, unspecified tear type, unspecified whether old or current tear, subsequent encounter   2. Tear of medial meniscus of right knee, current, unspecified tear type, subsequent encounter   3. Unilateral primary osteoarthritis, right knee     Plan:  #1: Corticosteroid injection was given today #2: Sent to physical therapy for the next 6 weeks so that she will fulfill the requirements of her insurance to have surgery.  Follow-Up Instructions: Return in about 3 weeks (around 02/03/2017).   Orders:  No orders of the defined types were placed in this encounter.  No orders of the defined types were placed in this encounter.     Procedures: Large Joint Inj Date/Time: 01/13/2017 6:03 PM Performed by: Jacqualine Code D Authorized by: Jacqualine Code D   Consent Given by:  Patient Timeout: prior to procedure the correct patient, procedure, and site was verified   Indications:  Pain and joint swelling Location:  Knee Site:  R knee Prep: patient was prepped and draped in usual sterile fashion   Needle Size:  25 G Needle Length:  1.5 inches Approach:  Anteromedial Ultrasound Guidance: No   Fluoroscopic Guidance: No   Arthrogram: No   Medications:  80 mg methylPREDNISolone acetate 40 MG/ML; 3 mL bupivacaine 0.5 % Aspiration Attempted: No   Patient tolerance:  Patient tolerated the procedure well with no immediate complications     Clinical Data: No additional findings.   Subjective: Chief Complaint  Patient presents with  . Right Knee - Pain, Edema    Vanessa [name] is a 56 y o that presents with Right knee pain and BIL ankle swelling. She wants  an injection until the R knee surgery can be scheduled.    Vanessa [name] is a 56 -year-old white female that present with chronic right knee pain. On 06/22/16 she received a cortisone injection which provided relief for only 2 weeks. She relates her pain was worsening with swelling. She had tried nonsteroidal anti-inflammatories that of ibuprofen with minimal help.  Vanessa [name] continued to have a problem with her right knee. X-rays demonstrated significant tricompartmental degenerative changes. She's had poor responses to cortisone. Her insurance company would not approve Visco supplementation. She does have popping and cracking and some limitation of motion. MRI was performed and revealed horizontal tears in the posterior horn  and posterior bodies of both menisci the medial and lateral menisci. The body of the medial meniscus was severely degenerated and diminutive. Because of her failure of several corticosteroid injections and with the representation of the MRI it was felt that a arthroscopic debridement was indicated. However her insurance has denied the surgery stating that according to their protocol she needs to have 6 weeks of physical therapy by a licensed physical therapist not the home exercise program that she performed. I had informed Vanessa [name] other decision and because of her continued pain and discomfort she wanted a corticosteroid injection and she is here today for that.    Review of Systems  Constitutional: Negative  for chills, fatigue and fever.  Eyes: Negative for itching.  Respiratory: Negative for chest tightness and shortness of breath.   Cardiovascular: Negative for chest pain, palpitations and leg swelling.  Gastrointestinal: Negative for blood in stool, constipation and diarrhea.  Musculoskeletal: Negative for back pain, joint swelling, neck pain and neck stiffness.  Neurological: Negative for dizziness, weakness, numbness and headaches.  Hematological: Does not  bruise/bleed easily.  Psychiatric/Behavioral: Negative for sleep disturbance. The patient is not nervous/anxious.      Objective: Vital Signs: BP (!) 149/72   Pulse 82   Ht 5\' 1"  (1.549 m)   Wt 188 lb (85.3 kg)   BMI 35.52 kg/m   Physical Exam  Constitutional: She is oriented to person, place, and time. She appears well-developed and well-nourished.  HENT:  Head: Normocephalic and atraumatic.  Eyes: Pupils are equal, round, and reactive to light. EOM are normal.  Pulmonary/Chest: Effort normal.  Neurological: She is alert and oriented to person, place, and time.  Skin: Skin is warm and dry.  Psychiatric: She has a normal mood and affect. Her behavior is normal. Judgment and thought content normal.    Ortho Exam  Exam today reveals some mild swelling in the right knee 1+.Calf is supple nontender. Range of motion from 0 to 100. Pain to palpation along the medial and lateral joint line  Specialty Comments:  No specialty comments available.  Imaging: 10/01/2016  MRI OF THE RIGHT KNEE WITHOUT CONTRAST  TECHNIQUE: Multiplanar, multisequence MR imaging of the knee was performed. No intravenous contrast was administered.  COMPARISON:  Plain films right knee performed at [facility] 06/22/2016.  FINDINGS: MENISCI  Medial meniscus: Horizontal tear in the posterior horn reaches the meniscal undersurface. The body is severely degenerated with marked blunting along its free edge throughout. The body is extruded peripherally.  Lateral meniscus: Horizontal tear in the posterior horn reaches the meniscal undersurface and extends into the posterior body.  LIGAMENTS  Cruciates:  Intact.  Mucoid degeneration of the ACL is noted.  Collaterals:  Intact.  CARTILAGE  Patellofemoral: Focal full-thickness fissures in cartilage are seen in the central and medial femoral trochlea. There is some blistering of cartilage at the apex of the patella in the  midpole.  Medial: Markedly thinned throughout with associated joint space narrowing.  Lateral:  Thinned and irregular throughout.  Joint:  Moderate to moderately large joint effusion.  Popliteal Fossa: Baker's cyst measures approximately 3 cm AP by 1.6 cm transverse by 3.3 cm craniocaudal.  Extensor Mechanism:  Intact.  Bones: Marked subchondral edema is present about the medial compartment. Small focus of subchondral edema is also seen in the weight-bearing surface of the lateral femoral condyle. Tricompartmental osteophytes are noted.  Other: None.  IMPRESSION: Dominant finding is advanced for age osteoarthritis about the knee appearing worst in the medial compartment.  Horizontal tears in the posterior horns and posterior bodies of both the medial and lateral menisci. The body of the medial meniscus is severely degenerated and diminutive.  Moderate to moderately large joint effusion.  Baker's cyst.   Electronically Signed   By: Drusilla Kanner M.D.   On: 10/01/2016 10:38   PMFS History: Patient Active Problem List   Diagnosis Date Noted  . Vitamin D deficiency 12/04/2016  . Baker's cyst of knee, right 05/21/2016  . Edema 12/03/2015  . Liver fibrosis 07/25/2015  . Corneal ulcers and infections 02/01/2015  . Uveitis 02/01/2015  . S/P laparoscopic cholecystectomy 11/25/2014  . Knee pain, right 07/17/2014  .  Physical exam 07/17/2014  . Pap smear for cervical cancer screening 07/17/2014  . Wheezing 04/06/2014  . Other and unspecified hyperlipidemia 01/12/2014  . Chronic hepatitis C (HCC) 04/27/2013  . 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 Hypertension   . Depression    Past Medical History:  Diagnosis Date  . Anxiety   . Chronic knee pain   . Depression   . Gall stone   . Hepatitis C   . Hypertension   . Syphilis     Family History  Problem Relation Age of Onset  . Hypertension Mother   . Diabetes Mother   . Drug abuse Brother    . Esophageal cancer Brother   . Colon cancer Brother   . Hypertension Sister        x's 2  . Diabetes Sister        oldest sister  . Stomach cancer Neg Hx     Past Surgical History:  Procedure Laterality Date  . CARPAL TUNNEL RELEASE Left   . CHOLECYSTECTOMY N/A 11/24/2014   Procedure: LAPAROSCOPIC CHOLECYSTECTOMY WITH INTRAOPERATIVE CHOLANGIOGRAM;  Surgeon: Gaynelle Adu, MD;  Location: [facility];  Service: General;  Laterality: N/A;  . ECTOPIC PREGNANCY SURGERY     Social History   Occupational History  . Dialysis Tech    Social History Main Topics  . Smoking status: Former Smoker    Packs/day: 1.00    Years: 35.00    Types: E-cigarettes    Start date: 05/25/1994  . Smokeless tobacco: Never Used  . Alcohol use No     Comment: "last alcohol was in the 1990's"  . Drug use: No     Comment: "last drug use was in the 1990's; never used heroin"  . Sexual activity: Not Currently

## 2017-01-13 NOTE — Telephone Encounter (Signed)
Pt came by the office today and stated she spoke with [facility] and they stated that for her to not have to complete the physical therapy that the dr will have to write a note to [facility] stating why she does not need it. The mailing address for the letter is [address] [place] [phone]. Please let pt know if this will be beneficial or if she will need to complete the therapy. Pt is not sure how she will pay for physical therapy.

## 2017-01-14 ENCOUNTER — Telehealth (INDEPENDENT_AMBULATORY_CARE_PROVIDER_SITE_OTHER): Payer: Self-pay | Admitting: Orthopaedic Surgery

## 2017-01-14 NOTE — Telephone Encounter (Signed)
Patient request a call from DrMarland Kitchen Cleophas Dunker to discuss insurance issue involving surgery cancellation.

## 2017-01-14 NOTE — Telephone Encounter (Signed)
Please see note from Taren.

## 2017-01-14 NOTE — Telephone Encounter (Signed)
Please call ASAP. Thank you.

## 2017-01-14 NOTE — Telephone Encounter (Signed)
Please see message below

## 2017-01-18 ENCOUNTER — Inpatient Hospital Stay (INDEPENDENT_AMBULATORY_CARE_PROVIDER_SITE_OTHER): Payer: BLUE CROSS/BLUE SHIELD | Admitting: Orthopaedic Surgery

## 2017-01-18 NOTE — Telephone Encounter (Signed)
Per our discussion this am. File an appeal with info given. Please call Ms Holst-thanks

## 2017-01-18 NOTE — Telephone Encounter (Signed)
Patient requesting a call back to find out if anything has changed since she last talked to you on Thursday. Please call back.

## 2017-01-19 NOTE — Telephone Encounter (Signed)
Per our discussion can you call Vanessa [name] re the appeal info?

## 2017-01-19 NOTE — Telephone Encounter (Signed)
Please advise 

## 2017-01-19 NOTE — Telephone Encounter (Signed)
thanks

## 2017-01-19 NOTE — Telephone Encounter (Signed)
Patient requesting a call back. Would like to know if doctor has talked to insurance company.

## 2017-01-19 NOTE — Telephone Encounter (Signed)
I have called pt and left several voice mails to let her know we have filed an appeal with [employer].

## 2017-01-20 NOTE — Telephone Encounter (Signed)
I believe you have done this

## 2017-01-20 NOTE — Telephone Encounter (Signed)
yes

## 2017-02-04 ENCOUNTER — Ambulatory Visit: Payer: BLUE CROSS/BLUE SHIELD | Admitting: Physician Assistant

## 2017-02-06 ENCOUNTER — Other Ambulatory Visit: Payer: Self-pay | Admitting: Family Medicine

## 2017-02-08 ENCOUNTER — Telehealth (INDEPENDENT_AMBULATORY_CARE_PROVIDER_SITE_OTHER): Payer: Self-pay | Admitting: Orthopaedic Surgery

## 2017-02-08 NOTE — Telephone Encounter (Signed)
Patient has not heard anything in awhile, and wants to check status of surgery. Patient states she has not heard anything about therapy either. Please call to advise.

## 2017-02-08 NOTE — Telephone Encounter (Signed)
Medication filled to pharmacy as requested.   

## 2017-02-08 NOTE — Telephone Encounter (Signed)
Last OV 12/04/16 Alprazolam last filled 12/04/16  #30 with 1

## 2017-02-08 NOTE — Telephone Encounter (Signed)
LVMOM

## 2017-02-09 ENCOUNTER — Telehealth (INDEPENDENT_AMBULATORY_CARE_PROVIDER_SITE_OTHER): Payer: Self-pay

## 2017-02-09 NOTE — Telephone Encounter (Signed)
Spoke with patient and she wanted to talk with her HR regarding insurance. Pt states she needed a place for PT that will bill her as she cannot pay at time of visit. Told her to ask HR about that also.

## 2017-02-09 NOTE — Telephone Encounter (Signed)
Vanessa [name] was expecting to go to PT PRIOR to surgery, 03/10/17. Does she need to come in for an H & P? Not sure if you have already done this.  Please advise

## 2017-02-09 NOTE — Telephone Encounter (Signed)
Patient returning your call. Please call her at work [phone]

## 2017-02-15 DIAGNOSIS — F1120 Opioid dependence, uncomplicated: Secondary | ICD-10-CM | POA: Diagnosis not present

## 2017-02-15 DIAGNOSIS — F411 Generalized anxiety disorder: Secondary | ICD-10-CM | POA: Diagnosis not present

## 2017-02-15 DIAGNOSIS — G894 Chronic pain syndrome: Secondary | ICD-10-CM | POA: Diagnosis not present

## 2017-02-15 DIAGNOSIS — G8929 Other chronic pain: Secondary | ICD-10-CM | POA: Diagnosis not present

## 2017-02-18 ENCOUNTER — Telehealth (INDEPENDENT_AMBULATORY_CARE_PROVIDER_SITE_OTHER): Payer: Self-pay | Admitting: Orthopaedic Surgery

## 2017-02-18 NOTE — Telephone Encounter (Signed)
What is the update on her HR?

## 2017-02-18 NOTE — Telephone Encounter (Addendum)
Patient requested to be scheduled to come in for an injection in her knee. Patient is scheduled an appt for 10/10 with Arlys John. Please advise if patient can get an injection now, or have to wait due to pending surgery. Also, per patient she would like to set up physical therapy now. Please call patient to advise.

## 2017-02-18 NOTE — Telephone Encounter (Signed)
What is the update?

## 2017-02-21 ENCOUNTER — Other Ambulatory Visit: Payer: Self-pay | Admitting: Family Medicine

## 2017-02-22 ENCOUNTER — Telehealth (INDEPENDENT_AMBULATORY_CARE_PROVIDER_SITE_OTHER): Payer: Self-pay | Admitting: Orthopaedic Surgery

## 2017-02-22 NOTE — Telephone Encounter (Signed)
Could you please check the status of this patient regarding her insurance, surgery. Thanks, Marylu Lund

## 2017-02-22 NOTE — Telephone Encounter (Signed)
Patient called to request that physical therapy be ordered. Patient is also requesting that it be done in [facility] near [address] if possible so she can go on her lunch breaks. Please advise.

## 2017-02-23 ENCOUNTER — Other Ambulatory Visit (INDEPENDENT_AMBULATORY_CARE_PROVIDER_SITE_OTHER): Payer: Self-pay

## 2017-02-23 DIAGNOSIS — M23321 Other meniscus derangements, posterior horn of medial meniscus, right knee: Secondary | ICD-10-CM

## 2017-02-23 NOTE — Telephone Encounter (Signed)
[facility] for PT per pt

## 2017-02-23 NOTE — Telephone Encounter (Signed)
Sent referral to [facility]

## 2017-02-23 NOTE — Telephone Encounter (Signed)
Sent referral to [facility] for PT per pt

## 2017-03-02 ENCOUNTER — Ambulatory Visit: Payer: BLUE CROSS/BLUE SHIELD | Attending: Orthopaedic Surgery | Admitting: Physical Therapy

## 2017-03-02 DIAGNOSIS — R262 Difficulty in walking, not elsewhere classified: Secondary | ICD-10-CM

## 2017-03-02 DIAGNOSIS — M6281 Muscle weakness (generalized): Secondary | ICD-10-CM | POA: Diagnosis not present

## 2017-03-02 DIAGNOSIS — G8929 Other chronic pain: Secondary | ICD-10-CM | POA: Diagnosis not present

## 2017-03-02 DIAGNOSIS — M25561 Pain in right knee: Secondary | ICD-10-CM | POA: Diagnosis not present

## 2017-03-02 DIAGNOSIS — R29898 Other symptoms and signs involving the musculoskeletal system: Secondary | ICD-10-CM | POA: Insufficient documentation

## 2017-03-02 NOTE — Patient Instructions (Signed)
HIP / KNEE: Flexion, Heel Slides - Supine   Slide heel up toward buttocks, keeping leg in straight line. _10-15__ reps per set, _2__ sets per day. Use towel or pillowcase under heel as needed.  Short Arc [employer] a large can or rolled towel under leg. Straighten knee and leg. Hold __5__ seconds. Repeat with other leg. Repeat __10-15__ times. Do _2___ sessions per day.  Strengthening: Straight Leg Raise (Phase 1)   Tighten muscles on front of right thigh, then lift leg __6-8__ inches from surface, keeping knee locked.  Repeat __10-15__ times per set. Do __2__ sets per session.   Bridge   Lie back, legs bent. Inhale, pressing hips up. Keeping ribs in, lengthen lower back. Exhale, rolling down along spine from top. Repeat __10-15__ times. Do __2__ sessions per day.  Long [employer]   Straighten operated leg and try to hold it __5__ seconds.  Repeat __15__ times. Do __2__ sessions a day.

## 2017-03-02 NOTE — Therapy (Signed)
[facility] [address]  [address] [place], [place], [address] Phone: [phone]   Fax:  [phone]  Physical Therapy Evaluation  Patient Details  Name: Vanessa [name] MRN: [id number] Date of Birth: [birth year] Referring Provider: Dr. Cleophas Dunker  Encounter Date: 03/02/2017      PT End of Session - 03/02/17 0943    Visit Number 1   Number of Visits 12   Date for PT Re-Evaluation 04/13/17   PT Start Time 0902   PT Stop Time 0933   PT Time Calculation (min) 31 min   Activity Tolerance Patient tolerated treatment well   Behavior During Therapy [facility] for tasks assessed/performed      Past Medical History:  Diagnosis Date  . Anxiety   . Chronic knee pain   . Depression   . Gall stone   . Hepatitis C   . Hypertension   . Syphilis     Past Surgical History:  Procedure Laterality Date  . CARPAL TUNNEL RELEASE Left   . CHOLECYSTECTOMY N/A 11/24/2014   Procedure: LAPAROSCOPIC CHOLECYSTECTOMY WITH INTRAOPERATIVE CHOLANGIOGRAM;  Surgeon: Gaynelle Adu, MD;  Location: [facility];  Service: General;  Laterality: N/A;  . ECTOPIC PREGNANCY SURGERY      There were no vitals filed for this visit.       Subjective Assessment - 03/02/17 0903    Subjective Patient reporting arthritis and torn meniscus in R knee. Wants to have surgery - says insurance need patient to do PT first. Has had pain for approx 1-1.5 years. Reports she did have aBakers cyst that burst. Pain goes into her behind from back of knee. Pain is constant. 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 Having another cortisone shot tomorrow - only helps very short term. Knee feels better straight   Pertinent History HTN   Limitations Standing;Walking   Patient Stated Goals improve pain   Currently in Pain? Yes   Pain Score 9    Pain Location Knee   Pain Orientation Right   Pain Descriptors / Indicators Throbbing   Pain Type Chronic pain   Pain Onset More than a month ago    Pain Frequency Constant   Aggravating Factors  everything, prolonges standing and walking   Pain Relieving Factors ice, ibuprofen            [facility] PT Assessment - 03/02/17 0908      Assessment   Medical Diagnosis Other meniscus derangements, posterior horn of medial meniscus, right knee   Referring Provider Dr. Cleophas Dunker   Onset Date/Surgical Date --  ~1 year ago   Next MD Visit 03/03/17   Prior Therapy no     Precautions   Precautions None     Restrictions   Weight Bearing Restrictions No     Balance Screen   Has the patient fallen in the past 6 months Yes   How many times? 1   Has the patient had a decrease in activity level because of a fear of falling?  No   Is the patient reluctant to leave their home because of a fear of falling?  No     Home Environment   Living Environment Private residence   Type of Home House   Home Access Level entry   Home Layout One level     Prior Function   Level of Independence Independent   Vocation Full time employment   Vocation Requirements Dialysis     Cognition  Overall Cognitive Status Within Functional Limits for tasks assessed     Observation/Other Assessments   Focus on Therapeutic Outcomes (FOTO)  Knee: 24 (76% limited, predicted 51% limited)     Sensation   Light Touch Appears Intact     Coordination   Gross Motor Movements are Fluid and Coordinated Yes     Posture/Postural Control   Posture/Postural Control Postural limitations   Postural Limitations Rounded Shoulders;Forward head     ROM / Strength   AROM / PROM / Strength AROM;Strength     AROM   AROM Assessment Site Knee   Right/Left Knee Right   Right Knee Extension 0   Right Knee Flexion 100  painful     Strength   Overall Strength Comments submaximal effort   Strength Assessment Site Hip;Knee   Right/Left Hip Right;Left   Right Hip Flexion 3-/5   Left Hip Flexion 4-/5   Right/Left Knee Right;Left   Right Knee Flexion 3/5   Right Knee  Extension 3/5   Left Knee Flexion 4-/5   Left Knee Extension 4-/5     Palpation   Patella mobility slightly reduced in all planes, slight pain with superior/inferior glides   Palpation comment tender to joint line            Objective measurements completed on examination: See above findings.          [facility] Adult PT Treatment/Exercise - 03/02/17 0908      Exercises   Exercises Knee/Hip     Knee/Hip Exercises: Seated   Long Arc Quad Right;5 reps     Knee/Hip Exercises: Supine   Short Arc Quad Sets Right;5 reps   Heel Slides Right;5 reps   Bridges Both;10 reps   Straight Leg Raises Right;5 reps                PT Education - 03/02/17 [phone]    Education provided Yes   Education Details exam findings, POC, HEP   Person(s) Educated Patient   Methods Explanation;Demonstration;Handout   Comprehension Verbalized understanding;Returned demonstration;Need further instruction          PT Short Term Goals - 03/02/17 0950      PT SHORT TERM GOAL #1   Title patient to be independent with initial HEP   Status New   Target Date 03/23/17     PT SHORT TERM GOAL #2   Title patient to demonstrate R knee AROM 0-115   Status New           PT Long Term Goals - 03/02/17 0950      PT LONG TERM GOAL #1   Title patient to be independent with advanced HEP   Status New   Target Date 04/13/17     PT LONG TERM GOAL #2   Title patient to demonstrate SLR with no quad lag x 10 reps with pain no greater than 2/10   Status New     PT LONG TERM GOAL #3   Title patient to improve R LE strength to >/= 4/5   Status New   Target Date 04/13/17     PT LONG TERM GOAL #4   Title patient to ascend/descend 1 flight of stairs with reciprocal gait pattern and 1 hand rail with no LOB or instability   Status New   Target Date 04/13/17     PT LONG TERM GOAL #5   Title patient to report pain reduction by 50% for greater than 2 weeks   Status  New   Target Date 04/13/17                 Plan - 03/02/17 0943    Clinical Impression Statement Patient is a 56 y/o female presenitng to OPPT today regarding R knee pain s/p meniscus tear of unknown origin. Patient today reporting pain that limits all daily activities, with patients primary job duties including being on feet for the majority of her day. AROM and MMT today with limitations primarily due to pain, however, submaximal effort given by patient. Discussion with patient regarding benefit of PT prior to possible surgery to allow for improvements in flexibility in strength to promote maximal functional mobility following any surgeries she may choose to have. Will focus on strengthening and improving ROM as well as any pain relieving modalities to promote functional mobility.    Clinical Presentation Stable   Clinical Decision Making Low   Rehab Potential Good   PT Frequency 2x / week   PT Duration 6 weeks   PT Treatment/Interventions ADLs/Self Care Home Management;Cryotherapy;Electrical Stimulation;Iontophoresis /ml Dexamethasone;Moist Heat;Ultrasound;Neuromuscular re-education;Balance training;Therapeutic exercise;Therapeutic activities;Functional mobility training;Stair training;Gait training;Patient/family education;Manual techniques;Passive range of motion;Vasopneumatic Device;Taping;Dry needling   Consulted and Agree with Plan of Care Patient      Patient will benefit from skilled therapeutic intervention in order to improve the following deficits and impairments:  Decreased activity tolerance, Decreased mobility, Decreased range of motion, Difficulty walking, Pain, Decreased strength  Visit Diagnosis: Chronic pain of right knee - Plan: PT plan of care cert/re-cert  Muscle weakness (generalized) - Plan: PT plan of care cert/re-cert  Difficulty in walking, not elsewhere classified - Plan: PT plan of care cert/re-cert  Other symptoms and signs involving the musculoskeletal system - Plan: PT plan of  care cert/re-cert     Problem List Patient Active Problem List   Diagnosis Date Noted  . Vitamin D deficiency 12/04/2016  . Baker's cyst of knee, right 05/21/2016  . Edema 12/03/2015  . Liver fibrosis 07/25/2015  . Corneal ulcers and infections 02/01/2015  . Uveitis 02/01/2015  . S/P laparoscopic cholecystectomy 11/25/2014  . Knee pain, right 07/17/2014  . Physical exam 07/17/2014  . Pap smear for cervical cancer screening 07/17/2014  . Wheezing 04/06/2014  . Other and unspecified hyperlipidemia 01/12/2014  . Chronic hepatitis C (HCC) 04/27/2013  . 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 Hypertension   . Depression      Kipp Laurence, PT, DPT 03/02/17 9:54 AM   [facility] [address]  [address] [place], [place], [address] Phone: [phone]   Fax:  [phone]  Name: Vanessa [name] MRN: [id number] Date of Birth: [birth year]

## 2017-03-03 ENCOUNTER — Ambulatory Visit (INDEPENDENT_AMBULATORY_CARE_PROVIDER_SITE_OTHER): Payer: BLUE CROSS/BLUE SHIELD | Admitting: Orthopedic Surgery

## 2017-03-03 ENCOUNTER — Encounter (INDEPENDENT_AMBULATORY_CARE_PROVIDER_SITE_OTHER): Payer: Self-pay | Admitting: Orthopedic Surgery

## 2017-03-03 VITALS — BP 126/90 | HR 102 | Resp 16 | Ht 61.0 in | Wt 185.0 lb

## 2017-03-03 DIAGNOSIS — S83241D Other tear of medial meniscus, current injury, right knee, subsequent encounter: Secondary | ICD-10-CM

## 2017-03-03 DIAGNOSIS — S83241A Other tear of medial meniscus, current injury, right knee, initial encounter: Secondary | ICD-10-CM | POA: Insufficient documentation

## 2017-03-03 DIAGNOSIS — S83281D Other tear of lateral meniscus, current injury, right knee, subsequent encounter: Secondary | ICD-10-CM | POA: Diagnosis not present

## 2017-03-03 DIAGNOSIS — S83281A Other tear of lateral meniscus, current injury, right knee, initial encounter: Secondary | ICD-10-CM | POA: Insufficient documentation

## 2017-03-03 MED ORDER — DICLOFENAC SODIUM 1 % TD GEL: 2.0000 g | Freq: Four times a day (QID) | TRANSDERMAL | 1 refills | Status: DC

## 2017-03-03 NOTE — Progress Notes (Signed)
Office Visit Note   Patient: Vanessa [name]           Date of Birth: 03-13-1961           MRN: [id number] Visit Date: 03/03/2017              Requested by: Sheliah Hatch, MD [address] [place], [place] [address] PCP: Sheliah Hatch, MD   Assessment & Plan: Visit Diagnoses:  1. Tear of medial meniscus of right knee, current, unspecified tear type, subsequent encounter   2. Tear of lateral meniscus of right knee, current, unspecified tear type, subsequent encounter     Plan:  #1: Voltaren gel prescription was given #2: Continue PT however if unable to continue then we will need to stop it and try to obtain clearance for her surgery from her insurance company.  Follow-Up Instructions: Return if symptoms worsen or fail to improve.   Orders:  No orders of the defined types were placed in this encounter.  No orders of the defined types were placed in this encounter.     Procedures: No procedures performed   Clinical Data: No additional findings.   Subjective: Chief Complaint  Patient presents with  . Right Knee - Pain  . Knee Pain    Right knee pain x 1 year, hx of baker's cyst, swelling, difficulty walking at times, popping, clicking, grinding noises, IBU helps some, not diabetic, no surgery to right knee, wants inj.    Ms. Vanessa [name] is a 56 -year-old white female that present with chronic right knee pain. On 06/22/16 she received a cortisone injection which provided relief for only 2 weeks. She relates her pain was worsening with swelling. She had tried nonsteroidal anti-inflammatories that of ibuprofen with minimal help.  Mrs. Vanessa [name] continued to have a problem with her right knee. X-rays demonstrated significant tricompartmental degenerative changes. She's had poor responses to cortisone. Her insurance company would not approve Visco supplementation. She does have popping and cracking and some limitation of motion. MRI was performed and revealed  horizontal tears in the posterior horn  and posterior bodies of both menisci the medial and lateral menisci. The body of the medial meniscus was severely degenerated and diminutive. Because of her failure of several corticosteroid injections and with the representation of the MRI it was felt that a arthroscopic debridement was indicated. However her insurance has denied the surgery stating that according to their protocol she needs to have 6 weeks of physical therapy by a licensed physical therapist not the home exercise program that she performed. She has had physical therapy now and is having worsening pain in her right knee and the exercises are quite painful for her. She's made an appointment to come in today to try to get another cortisone injection. Her last injection however was on 01/13/2017. This is about 6-7 weeks ago. Seen today for reevaluation.    Review of Systems  Constitutional: Negative for chills, fatigue and fever.  Eyes: Negative for itching.  Respiratory: Negative for chest tightness and shortness of breath.   Cardiovascular: Negative for chest pain, palpitations and leg swelling.  Gastrointestinal: Negative for blood in stool, constipation and diarrhea.  Musculoskeletal: Negative for back pain, joint swelling, neck pain and neck stiffness.  Neurological: Negative for dizziness, weakness, numbness and headaches.  Hematological: Does not bruise/bleed easily.  Psychiatric/Behavioral: Negative for sleep disturbance. The patient is not nervous/anxious.      Objective: Vital Signs: BP 126/90 (BP Location: Left Arm,  Patient Position: Sitting, Cuff Size: Normal)   Pulse (!) 102   Resp 16   Ht  (1.549 m)   Wt 185 lb (83.9 kg)   BMI 34.96 kg/m   Physical Exam  Constitutional: She is oriented to person, place, and time. She appears well-developed and well-nourished.  HENT:  Head: Normocephalic and atraumatic.  Eyes: Pupils are equal, round, and reactive to light. EOM  are normal.  Pulmonary/Chest: Effort normal.  Neurological: She is alert and oriented to person, place, and time.  Skin: Skin is warm and dry.  Psychiatric: She has a normal mood and affect. Her behavior is normal. Judgment and thought content normal.    Ortho Exam  Today exam reveals some mild swelling in the right knee trace to 1+ degrees. Range of motion from 0 to 100 of flexion. Pain to palpation along the medial and lateral joint line but worse mediallyl. She does have some swelling posteriorly in the popliteal area with local tenderness to palpation.Calf is supple nontender.   Specialty Comments:  No specialty comments available.  Imaging: No results found.   PMFS History: Patient Active Problem List   Diagnosis Date Noted  . Tear of medial meniscus of right knee, current 03/03/2017  . Tear of lateral meniscus of right knee, current 03/03/2017  . Vitamin D deficiency 12/04/2016  . Baker's cyst of knee, right 05/21/2016  . Edema 12/03/2015  . Liver fibrosis 07/25/2015  . Corneal ulcers and infections 02/01/2015  . Uveitis 02/01/2015  . S/P laparoscopic cholecystectomy 11/25/2014  . Knee pain, right 07/17/2014  . Physical exam 07/17/2014  . Pap smear for cervical cancer screening 07/17/2014  . Wheezing 04/06/2014  . Other and unspecified hyperlipidemia 01/12/2014  . Chronic hepatitis C (HCC) 04/27/2013  . 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 Hypertension   . Depression    Past Medical History:  Diagnosis Date  . Anxiety   . Chronic knee pain   . Depression   . Gall stone   . Hepatitis C   . Hypertension   . Syphilis     Family History  Problem Relation Age of Onset  . Hypertension Mother   . Diabetes Mother   . Drug abuse Brother   . Esophageal cancer Brother   . Colon cancer Brother   . Hypertension Sister        x's 2  . Diabetes Sister        oldest sister  . Stomach cancer Neg Hx     Past Surgical History:  Procedure Laterality Date   . CARPAL TUNNEL RELEASE Left   . CHOLECYSTECTOMY N/A 11/24/2014   Procedure: LAPAROSCOPIC CHOLECYSTECTOMY WITH INTRAOPERATIVE CHOLANGIOGRAM;  Surgeon: Gaynelle Adu, MD;  Location: [facility];  Service: General;  Laterality: N/A;  . ECTOPIC PREGNANCY SURGERY     Social History   Occupational History  . Dialysis Tech    Social History Main Topics  . Smoking status: Former Smoker    Packs/day: 1.00    Years: 35.00    Types: E-cigarettes    Start date: 05/25/1994    Quit date: 03/03/2016  . Smokeless tobacco: Never Used  . Alcohol use No     Comment: "last alcohol was in the 1990's"  . Drug use: No     Comment: "last drug use was in the 1990's; never used heroin"  . Sexual activity: Not Currently

## 2017-03-04 ENCOUNTER — Telehealth: Payer: Self-pay | Admitting: Family Medicine

## 2017-03-04 MED ORDER — BENAZEPRIL-HYDROCHLOROTHIAZIDE 10-12.5 MG PO TABS: 1.0000 | ORAL_TABLET | Freq: Every day | ORAL | 0 refills | Status: DC

## 2017-03-04 NOTE — Telephone Encounter (Signed)
Medication filled to pharmacy as requested.   

## 2017-03-04 NOTE — Telephone Encounter (Signed)
Pt states that she needs a refill on benazepril-hydrochlorthiazide, [facility] [address]

## 2017-03-09 ENCOUNTER — Ambulatory Visit: Payer: BLUE CROSS/BLUE SHIELD

## 2017-03-09 DIAGNOSIS — R29898 Other symptoms and signs involving the musculoskeletal system: Secondary | ICD-10-CM | POA: Diagnosis not present

## 2017-03-09 DIAGNOSIS — R262 Difficulty in walking, not elsewhere classified: Secondary | ICD-10-CM

## 2017-03-09 DIAGNOSIS — G8929 Other chronic pain: Secondary | ICD-10-CM

## 2017-03-09 DIAGNOSIS — M6281 Muscle weakness (generalized): Secondary | ICD-10-CM

## 2017-03-09 DIAGNOSIS — M25561 Pain in right knee: Secondary | ICD-10-CM | POA: Diagnosis not present

## 2017-03-09 NOTE — Therapy (Addendum)
[facility] [address]  [place], [place], [address] Phone: [phone]   Fax:  [phone]  Physical Therapy Treatment  Patient Details  Name: Vanessa [name] MRN: [id number] Date of Birth: Jan 24, 1961 Referring Provider: Dr. Durward Fortes  Encounter Date: 03/09/2017      PT End of Session - 03/09/17 0853    Visit Number 2   Number of Visits 12   Date for PT Re-Evaluation 04/13/17   PT Start Time 0846   PT Stop Time 0933   PT Time Calculation (min) 47 min   Activity Tolerance Patient tolerated treatment well   Behavior During Therapy [facility] for tasks assessed/performed      Past Medical History:  Diagnosis Date  . Anxiety   . Chronic knee pain   . Depression   . Gall stone   . Hepatitis C   . Hypertension   . Syphilis     Past Surgical History:  Procedure Laterality Date  . CARPAL TUNNEL RELEASE Left   . CHOLECYSTECTOMY N/A 11/24/2014   Procedure: LAPAROSCOPIC CHOLECYSTECTOMY WITH INTRAOPERATIVE CHOLANGIOGRAM;  Surgeon: Greer Pickerel, MD;  Location: [facility];  Service: General;  Laterality: N/A;  . ECTOPIC PREGNANCY SURGERY      There were no vitals filed for this visit.      Subjective Assessment - 03/09/17 0848    Subjective Pt. noting 10/10 pain following last visit.     Patient Stated Goals improve pain   Currently in Pain? Yes   Pain Score 8    Pain Location Knee   Pain Orientation Right   Pain Descriptors / Indicators Throbbing   Pain Type Chronic pain   Pain Onset More than a month ago   Pain Frequency Constant   Aggravating Factors  Everything, prolonged    Multiple Pain Sites No                         [facility] Adult PT Treatment/Exercise - 03/09/17 0858      Knee/Hip Exercises: Stretches   Passive Hamstring Stretch Right;2 reps;20 seconds   Passive Hamstring Stretch Limitations with strap; 3-direction HS stretch x 20 sec each way      Knee/Hip Exercises: Aerobic   Nustep  NuStep: lvl 3, 6 min      Knee/Hip Exercises: Seated   Long Arc Quad Right;10 reps   Long Arc Quad Limitations with adduction ball squeeze      Knee/Hip Exercises: Supine   Short Arc Quad Sets Right;15 reps   Short Arc Quad Sets Limitations Constant cueing required for pacing and hold times    Heel Slides Right;10 reps   Heel Slides Limitations heel on towel with strap assist    Bridges Both;10 reps   Straight Leg Raises 10 reps;Right   Other Supine Knee/Hip Exercises HS curl with heels on peanut p-ball x 15 reps      Modalities   Modalities Vasopneumatic     Vasopneumatic   Number Minutes Vasopneumatic  8 minutes  only 8 min due to pt. needing to return to work    [employer] Location  Knee  R   Vasopneumatic Pressure Medium   Vasopneumatic Temperature  coldest temp.     Manual Therapy   Manual Therapy Soft tissue mobilization   Manual therapy comments hooklying    Soft tissue mobilization STM/strumming to R ITB/VL area; over area of tenderness  PT Short Term Goals - 03/09/17 1517      PT SHORT TERM GOAL #1   Title patient to be independent with initial HEP   Status On-going     PT SHORT TERM GOAL #2   Title patient to demonstrate R knee AROM 0-115   Status On-going           PT Long Term Goals - 03/09/17 0911      PT LONG TERM GOAL #1   Title patient to be independent with advanced HEP   Status On-going     PT LONG TERM GOAL #2   Title patient to demonstrate SLR with no quad lag x 10 reps with pain no greater than 2/10   Status On-going     PT LONG TERM GOAL #3   Title patient to improve R LE strength to >/= 4/5   Status On-going     PT LONG TERM GOAL #4   Title patient to ascend/descend 1 flight of stairs with reciprocal gait pattern and 1 hand rail with no LOB or instability   Status On-going     PT LONG TERM GOAL #5   Title patient to report pain reduction by 50% for greater than 2 weeks   Status On-going                Plan - 03/09/17 0912    Clinical Impression Statement Devanie reports she is performing HEP.  Some cueing required with HEP for proper technique and hold times.  Pt. tends to perform all activities with quick movements and without control.  Good overall tolerance for therex today however high reported pain levels throughout.  Pt. movements and demeanor inconsistent with pain reports in session today.  Treatment concluding with ice/compression to decrease post-exercise swelling and pain.   PT Treatment/Interventions ADLs/Self Care Home Management;Cryotherapy;Electrical Stimulation;Iontophoresis 63m/ml Dexamethasone;Moist Heat;Ultrasound;Neuromuscular re-education;Balance training;Therapeutic exercise;Therapeutic activities;Functional mobility training;Stair training;Gait training;Patient/family education;Manual techniques;Passive range of motion;Vasopneumatic Device;Taping;Dry needling      Patient will benefit from skilled therapeutic intervention in order to improve the following deficits and impairments:  Decreased activity tolerance, Decreased mobility, Decreased range of motion, Difficulty walking, Pain, Decreased strength  Visit Diagnosis: Chronic pain of right knee  Muscle weakness (generalized)  Difficulty in walking, not elsewhere classified  Other symptoms and signs involving the musculoskeletal system     Problem List Patient Active Problem List   Diagnosis Date Noted  . Tear of medial meniscus of right knee, current 03/03/2017  . Tear of lateral meniscus of right knee, current 03/03/2017  . Vitamin D deficiency 12/04/2016  . Baker's cyst of knee, right 05/21/2016  . Edema 12/03/2015  . Liver fibrosis 07/25/2015  . Corneal ulcers and infections 02/01/2015  . Uveitis 02/01/2015  . S/P laparoscopic cholecystectomy 11/25/2014  . Knee pain, right 07/17/2014  . Physical exam 07/17/2014  . Pap smear for cervical cancer screening 07/17/2014  . Wheezing  04/06/2014  . Other and unspecified hyperlipidemia 01/12/2014  . Chronic hepatitis C ([facility] 04/27/2013  . Substance abuse in remission ([place] 04/27/2013  . Hypertension   . Depression     MBess Harvest PTA 03/09/17 7:30 PM   PHYSICAL THERAPY DISCHARGE SUMMARY  Visits from Start of Care: 2  Current functional level related to goals / functional outcomes: See above   Remaining deficits: See above, pain limiting functional movements   Education / Equipment: HEP  Plan: Patient agrees to discharge.  Patient goals were not met. Patient is being discharged due to  not returning since the last visit.  ?????    Patient cancelling multiple visits due to pain. Unable to assess goals or current function due to absence from PT.  Lanney Gins, PT, DPT 04/06/17 3:12 PM   [facility] [address]  [place], [place], [address] Phone: [phone]   Fax:  [phone]  Name: Taviana Westergren MRN: [id number] Date of Birth: 15-Sep-1960

## 2017-03-10 ENCOUNTER — Other Ambulatory Visit: Payer: Self-pay | Admitting: Family Medicine

## 2017-03-10 ENCOUNTER — Ambulatory Visit: Payer: BLUE CROSS/BLUE SHIELD | Admitting: Family Medicine

## 2017-03-10 DIAGNOSIS — Z0289 Encounter for other administrative examinations: Secondary | ICD-10-CM

## 2017-03-11 ENCOUNTER — Ambulatory Visit: Payer: BLUE CROSS/BLUE SHIELD

## 2017-03-15 ENCOUNTER — Inpatient Hospital Stay (INDEPENDENT_AMBULATORY_CARE_PROVIDER_SITE_OTHER): Payer: BLUE CROSS/BLUE SHIELD | Admitting: Orthopaedic Surgery

## 2017-03-15 DIAGNOSIS — G8929 Other chronic pain: Secondary | ICD-10-CM | POA: Diagnosis not present

## 2017-03-15 DIAGNOSIS — F1120 Opioid dependence, uncomplicated: Secondary | ICD-10-CM | POA: Diagnosis not present

## 2017-03-15 DIAGNOSIS — G894 Chronic pain syndrome: Secondary | ICD-10-CM | POA: Diagnosis not present

## 2017-03-15 DIAGNOSIS — F411 Generalized anxiety disorder: Secondary | ICD-10-CM | POA: Diagnosis not present

## 2017-03-16 ENCOUNTER — Ambulatory Visit: Payer: BLUE CROSS/BLUE SHIELD

## 2017-03-17 ENCOUNTER — Telehealth: Payer: Self-pay | Admitting: Family Medicine

## 2017-03-17 ENCOUNTER — Emergency Department (HOSPITAL_COMMUNITY)
Admission: EM | Admit: 2017-03-17 | Discharge: 2017-03-17 | Disposition: A | Discharge status: Home / Self Care | Payer: BLUE CROSS/BLUE SHIELD | Attending: Emergency Medicine | Admitting: Emergency Medicine

## 2017-03-17 ENCOUNTER — Encounter (HOSPITAL_COMMUNITY): Payer: Self-pay | Admitting: Emergency Medicine

## 2017-03-17 DIAGNOSIS — F329 Major depressive disorder, single episode, unspecified: Secondary | ICD-10-CM | POA: Insufficient documentation

## 2017-03-17 DIAGNOSIS — Z87891 Personal history of nicotine dependence: Secondary | ICD-10-CM | POA: Diagnosis not present

## 2017-03-17 DIAGNOSIS — Z5321 Procedure and treatment not carried out due to patient leaving prior to being seen by health care provider: Secondary | ICD-10-CM | POA: Diagnosis not present

## 2017-03-17 DIAGNOSIS — Z79899 Other long term (current) drug therapy: Secondary | ICD-10-CM | POA: Diagnosis not present

## 2017-03-17 DIAGNOSIS — I10 Essential (primary) hypertension: Secondary | ICD-10-CM | POA: Diagnosis not present

## 2017-03-17 DIAGNOSIS — F32A Depression, unspecified: Secondary | ICD-10-CM

## 2017-03-17 DIAGNOSIS — F339 Major depressive disorder, recurrent, unspecified: Secondary | ICD-10-CM | POA: Diagnosis not present

## 2017-03-17 NOTE — Telephone Encounter (Signed)
Fyi.

## 2017-03-17 NOTE — BHH Counselor (Signed)
This worker went to complete the assessment but the Pt left [facility] AMA.  Wolfgang PhoenixBrandi Lei Dower, [facility] Triage Specialist

## 2017-03-17 NOTE — Telephone Encounter (Signed)
Patient called, reports crying, sadness, outburst and depression x 2 months, increasing the last 4 days. [facility] but left because they took her clothes and belongings. Patient requesting to be seen by PCP today. Discussed with patient resources available at [facility] that we do not have available here. Explained her belongings were held for the safety of staff and herself. [facility]. Patient some calmer by end of call, agreed to go back to the Emergency Department.   Patient reports knee surgery has been cancelled until she can complete PT per insurance. She states she missed last several appts for PT d/t pain. Encouraged to contact orthopedics.

## 2017-03-17 NOTE — Progress Notes (Signed)
03/17/17  1554  Patient refused to give belonging to be placed in locker. Patient decided to leave AMA. Notified Dr Adriana Simasook that patient wanted to leave AMA. Per Dr Adriana Simasook place is stable for discharge. Pt signed AMA.

## 2017-03-17 NOTE — ED Triage Notes (Signed)
Pt verbalizes worsening depression and sadness for a few months; denies SI/HI.

## 2017-03-17 NOTE — ED Provider Notes (Addendum)
[facility]-EMERGENCY DEPT Provider Note   CSN: [id number] Arrival date & time: 03/17/17  1451     History   Chief Complaint Chief Complaint  Patient presents with  . Depression    HPI Vanessa [name] is a 56 y.o. female.  Patient reports feeling of depression for several months.  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  She reports inappropriate outbursts.  Her supervisor has encouraged her to come for help.  She states no suicidal ideation, but her daughter disagrees.  No homicidal ideation.  No alcohol or street drugs.  She does take Xanax which is prescribed.  She reports right knee pain which limits her mobility.      Past Medical History:  Diagnosis Date  . Anxiety   . Chronic knee pain   . Depression   . Gall stone   . Hepatitis C   . Hypertension   . Syphilis     Patient Active Problem List   Diagnosis Date Noted  . Tear of medial meniscus of right knee, current 03/03/2017  . Tear of lateral meniscus of right knee, current 03/03/2017  . Vitamin D deficiency 12/04/2016  . Baker's cyst of knee, right 05/21/2016  . Edema 12/03/2015  . Liver fibrosis 07/25/2015  . Corneal ulcers and infections 02/01/2015  . Uveitis 02/01/2015  . S/P laparoscopic cholecystectomy 11/25/2014  . Knee pain, right 07/17/2014  . Physical exam 07/17/2014  . Pap smear for cervical cancer screening 07/17/2014  . Wheezing 04/06/2014  . Other and unspecified hyperlipidemia 01/12/2014  . Chronic hepatitis C (HCC) 04/27/2013  . 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 Hypertension   . Depression     Past Surgical History:  Procedure Laterality Date  . CARPAL TUNNEL RELEASE Left   . CHOLECYSTECTOMY N/A 11/24/2014   Procedure: LAPAROSCOPIC CHOLECYSTECTOMY WITH INTRAOPERATIVE CHOLANGIOGRAM;  Surgeon: Gaynelle AduEric Wilson, MD;  Location: [facility];  Service: General;  Laterality: N/A;  . ECTOPIC PREGNANCY SURGERY      OB  History    No data available       Home Medications    Prior to Admission medications   Medication Sig Start Date End Date Taking? Authorizing Provider  albuterol (PROVENTIL HFA;VENTOLIN HFA) 108 (90 BASE) MCG/ACT inhaler Inhale 2 puffs into the lungs every 6 (six) hours as needed for wheezing or shortness of breath. 04/06/14   Carollee Leitzoss, Carrie M, RN  ALPRAZolam Prudy Feeler(XANAX) 0.5 MG tablet TAKE 1 TABLET EVERY DAY AS NEEDED 02/08/17   Sheliah Hatchabori, Katherine E, MD  benazepril-hydrochlorthiazide (LOTENSIN HCT) 10-12.5 MG tablet Take 1 tablet by mouth daily. 03/04/17   Sheliah Hatchabori, Katherine E, MD  buprenorphine (SUBUTEX) 8 MG SUBL SL tablet  01/11/17   [provider]  diclofenac sodium (VOLTAREN) 1 % GEL Apply 2 g topically 4 (four) times daily. 03/03/17   Jetty PeeksPetrarca, Caz Weaver D, PA-C  furosemide (LASIX) 40 MG tablet Take 1 tablet (40 mg total) by mouth daily. 05/21/16   Sheliah Hatchabori, Katherine E, MD  ibuprofen (ADVIL,MOTRIN) 800 MG tablet TAKE 1 TABLET THREE TIMES A DAY AS NEEDED 12/21/16   Sheliah Hatchabori, Katherine E, MD  meloxicam (MOBIC) 7.5 MG tablet Take 1 tablet (7.5 mg total) by mouth daily. 11/26/16   Khatri, Hina, PA-C  SUBOXONE 8-2 MG FILM Place 0.5 strips under the tongue 2 (two) times daily. 04/18/13   [provider]  venlafaxine XR (EFFEXOR-XR) 150 MG 24 hr capsule TAKE 1 CAPSULE (150 MG TOTAL) BY  MOUTH DAILY WITH BREAKFAST. 02/22/17   Sheliah Hatch, MD  Vitamin D, Ergocalciferol, (DRISDOL) 50000 units CAPS capsule Take 1 capsule (50,000 Units total) by mouth every 7 (seven) days. Patient not taking: Reported on 03/03/2017 12/07/16   Sheliah Hatch, MD    Family History Family History  Problem Relation Age of Onset  . Hypertension Mother   . Diabetes Mother   . Drug abuse Brother   . Esophageal cancer Brother   . Colon cancer Brother   . Hypertension Sister        x's 2  . Diabetes Sister        oldest sister  . Stomach cancer Neg Hx     Social History Social History  Substance  Use Topics  . Smoking status: Former Smoker    Packs/day: 1.00    Years: 35.00    Types: E-cigarettes    Start date: 05/25/1994    Quit date: 03/03/2016  . Smokeless tobacco: Never Used  . Alcohol use No     Comment: "last alcohol was in the 1990's"     Allergies   Patient has no known allergies.   Review of Systems Review of Systems  All other systems reviewed and are negative.    Physical Exam Updated Vital Signs BP (!) 128/92 (BP Location: Right Arm)   Pulse 97   Temp 98 F (36.7 C) (Oral)   Resp 18   SpO2 97%   Physical Exam  Constitutional: She is oriented to person, place, and time. She appears well-developed and well-nourished.  HENT:  Head: Normocephalic and atraumatic.  Eyes: Conjunctivae are normal.  Neck: Neck supple.  Cardiovascular: Normal rate and regular rhythm.   Pulmonary/Chest: Effort normal and breath sounds normal.  Abdominal: Soft. Bowel sounds are normal.  Musculoskeletal: Normal range of motion.  Neurological: She is alert and oriented to person, place, and time.  Skin: Skin is warm and dry.  Psychiatric:  Flat affect, depressed  Nursing note and vitals reviewed.    ED Treatments / Results  Labs (all labs ordered are listed, but only abnormal results are displayed) Labs Reviewed  CBC WITH DIFFERENTIAL/PLATELET  COMPREHENSIVE METABOLIC PANEL  ETHANOL  RAPID URINE DRUG SCREEN, HOSP PERFORMED    EKG  EKG Interpretation None       Radiology No results found.  Procedures Procedures (including critical care time)  Medications Ordered in ED Medications - No data to display   Initial Impression / Assessment and Plan / ED Course  I have reviewed the triage vital signs and the nursing notes.  Pertinent labs & imaging results that were available during my care of the patient were reviewed by me and considered in my medical decision making (see chart for details).   Patient is more depressed.  No evidence of psychosis.  Will  obtain behavioral health consult  1550: Patient wants to leave AMA.  She is lucid and of sound mind.  No evidence of psychosis.  No suicidal or homicidal ideation   Final Clinical Impressions(s) / ED Diagnoses   Final diagnoses:  Depression, unspecified depression type    New Prescriptions New Prescriptions   No medications on file     Donnetta Hutching, MD 03/17/17 1527    Donnetta Hutching, MD 03/17/17 1556

## 2017-03-17 NOTE — Telephone Encounter (Signed)
[facility] - Cli TELEPHONE ADVICE RECORD   [facility]     Patient Name: Vanessa [name] Initial Comment Pt states she has outburst and panic attacks. These seem to be getting worse.  DOB: 05/19/1961      Nurse Assessment  Nurse: Clarita LeberWomble, RN, Deborah Date/Time (Eastern Time): 03/17/2017 2:34:49 PM  Confirm and document reason for call. If symptomatic, describe symptoms. ---The caller states that she can't stop crying. She states that she has severe arthritis in her knees and a torn meniscus. She states that the right is worse than the left. She states that she is all to pieces. She states that she needs to speak with Dr. Beverely Lowabori. She states that she was scheduled for surgery but the insurance made her do PT. She states that she is taking ibuprofen. She is using ice for her pain and swelling. She states that the swelling never goes down. She takes medication for hypertension.  Does the patient have any new or worsening symptoms? ---Yes  Will a triage be completed? ---Yes  Related visit to physician within the last 2 weeks? ---No  Does the PT have any chronic conditions? (i.e. diabetes, asthma, etc.) ---Yes  List chronic conditions. ---hypertension and arthritis  Is this a behavioral health or substance abuse call? ---Yes  Are you having any thoughts or feelings of harming or killing yourself or someone else? ---Yes  Do you have a weapon with you? ---No  Are you alone? ---No  Enter any comments. ---She states that she can't stop crying and her supervisor sent her home.  Are you currently experiencing any physical discomfort that you think may be related to the use of alcohol or other drugs? (use substance abuse or alcohol abuse guidelines. These include withdrawal symptoms) ---No  Do you worry that you may be hearing or seeing things that others do not? ---No  Do you take medications for your condition(s)? ---No    Guidelines     Guideline  Title Affirmed Question Affirmed Notes   Depression Sounds like a life-threatening emergency to the triager    Final Disposition User   Call EMS 911 Now Clarita LeberWomble, RN, [employer]orah   Comments   Spoke with daughter and instructed her to make sure that her mother tells the ER staff that she has thought about harming herself and she verbalized understanding (Myra). The caller [facility] at the time. The caller had also told the nurse that recently her doctor had started her on Xanax and that is not helping her.   Referrals   [facility] - [facility]   Caller Disagree/Comply Comply  Caller Understands Yes  PreDisposition Go to ED

## 2017-03-18 ENCOUNTER — Ambulatory Visit: Payer: BLUE CROSS/BLUE SHIELD | Admitting: Physical Therapy

## 2017-03-18 ENCOUNTER — Telehealth: Payer: Self-pay | Admitting: Family Medicine

## 2017-03-18 NOTE — Telephone Encounter (Signed)
Please advise 

## 2017-03-18 NOTE — Telephone Encounter (Signed)
Patient informed of information from PCP and stated verbal understanding.    Patient requested an appointment for next week, and I scheduled a 30 minute office visit.  Patient is aware to go to ED/Behavioral Health if she begins to feel like she did yesterday.    Patient stated verbal understanding.

## 2017-03-18 NOTE — Telephone Encounter (Signed)
The ER does not turn people away and if she is having such severe depression and anxiety, she really does need a mental health professional.  [facility]  In the meantime, she can take 2 alprazolam as needed for high anxiety but this is not a long term solution.

## 2017-03-18 NOTE — Telephone Encounter (Signed)
Disregard

## 2017-03-18 NOTE — Telephone Encounter (Signed)
Pt called back stating that the hospital would not take her back last night and asking if she could increase her xanax to two a day, pt states it says to take as needed and wanted to make sure this was ok.

## 2017-03-23 ENCOUNTER — Ambulatory Visit: Payer: BLUE CROSS/BLUE SHIELD | Admitting: Physical Therapy

## 2017-03-24 ENCOUNTER — Other Ambulatory Visit: Payer: Self-pay | Admitting: General Practice

## 2017-03-24 ENCOUNTER — Ambulatory Visit (INDEPENDENT_AMBULATORY_CARE_PROVIDER_SITE_OTHER): Payer: BLUE CROSS/BLUE SHIELD | Admitting: Family Medicine

## 2017-03-24 ENCOUNTER — Encounter: Payer: Self-pay | Admitting: Family Medicine

## 2017-03-24 VITALS — BP 134/82 | HR 85 | Temp 98.1°F | Resp 17 | Ht 61.0 in | Wt 184.0 lb

## 2017-03-24 DIAGNOSIS — F331 Major depressive disorder, recurrent, moderate: Secondary | ICD-10-CM

## 2017-03-24 DIAGNOSIS — R159 Full incontinence of feces: Secondary | ICD-10-CM

## 2017-03-24 MED ORDER — VENLAFAXINE HCL ER 75 MG PO CP24: 75.0000 mg | ORAL_CAPSULE | Freq: Every day | ORAL | 3 refills | Status: DC

## 2017-03-24 MED ORDER — ALPRAZOLAM 0.5 MG PO TABS: 0.5000 mg | ORAL_TABLET | Freq: Two times a day (BID) | ORAL | 1 refills | Status: DC | PRN

## 2017-03-24 NOTE — Telephone Encounter (Signed)
Please advise,pt asked if you could refill alprazolam. She stated that she is now taking 2 tablets daily. SIG and dose were changed on pended prescription.

## 2017-03-24 NOTE — Assessment & Plan Note (Signed)
Deteriorated.  Pt is having a difficult time with her current physical issues and this is bringing her down.  She is able to contract for safety.  Will add 75mg  to current dose of 150 for a total of 225mg  daily.  Pt can continue her increased Alprazolam dose for the short term.  Pt expressed understanding and is in agreement w/ plan.

## 2017-03-24 NOTE — Telephone Encounter (Signed)
Medication filled to pharmacy as requested.   

## 2017-03-24 NOTE — Patient Instructions (Signed)
Follow up in 3-4 weeks to recheck mood ADD the 75mg  Venlafaxine to the current 150mg  dose that you have (for a total of 225mg ) Use the Constipation handout to improve your symptoms Drink plenty of water to help with the constipation Call with any questions or concerns Hang in there!

## 2017-03-24 NOTE — Progress Notes (Signed)
   Subjective:    Patient ID: Vanessa HakimSusie [name], female    DOB: [birth year], 56 y.o.   MRN: [id number]  HPI Anxiety/depression- chronic problem but has acutely worsened w/ her current knee issues and level of pain.  She is currently on Venlafaxine 150mg  daily.  She reports she's 'crying all the time.  I just can't get myself together sometimes'.  'i'm just depressed'.  Pt has increased her Alprazolam to 2 tabs daily.  Pt has been on Wellbutrin in the past 'but it didn't work for me.  It made me feel a way I didn't want to feel'.  Constipation- pt reports she is only having 1 BM a week but over the last 2 days has had leakage w/o awareness.   Review of Systems For ROS see HPI     Objective:   Physical Exam  Constitutional: She is oriented to person, place, and time. She appears well-developed and well-nourished.  Abdominal: Soft. Bowel sounds are normal. She exhibits no distension. There is no tenderness. There is no rebound and no guarding.  Neurological: She is alert and oriented to person, place, and time.  Psychiatric: Her behavior is normal. Thought content normal.  Tearful, anxious  Vitals reviewed.         Assessment & Plan:  Constipation w/ overflow incontinence- new.  Start bowel regimen w/ MoM and prune juice.  Reviewed supportive care and red flags that should prompt return.  Pt expressed understanding and is in agreement w/ plan.

## 2017-04-29 ENCOUNTER — Other Ambulatory Visit: Payer: Self-pay | Admitting: Emergency Medicine

## 2017-04-29 MED ORDER — VENLAFAXINE HCL ER 150 MG PO CP24: ORAL_CAPSULE | ORAL | 0 refills | Status: DC

## 2017-05-10 DIAGNOSIS — G894 Chronic pain syndrome: Secondary | ICD-10-CM | POA: Diagnosis not present

## 2017-05-10 DIAGNOSIS — F411 Generalized anxiety disorder: Secondary | ICD-10-CM | POA: Diagnosis not present

## 2017-05-10 DIAGNOSIS — F1120 Opioid dependence, uncomplicated: Secondary | ICD-10-CM | POA: Diagnosis not present

## 2017-05-10 DIAGNOSIS — M545 Low back pain: Secondary | ICD-10-CM | POA: Diagnosis not present

## 2017-05-12 ENCOUNTER — Ambulatory Visit (INDEPENDENT_AMBULATORY_CARE_PROVIDER_SITE_OTHER): Payer: BLUE CROSS/BLUE SHIELD | Admitting: Orthopedic Surgery

## 2017-05-27 ENCOUNTER — Other Ambulatory Visit: Payer: Self-pay | Admitting: Family Medicine

## 2017-06-07 ENCOUNTER — Inpatient Hospital Stay (INDEPENDENT_AMBULATORY_CARE_PROVIDER_SITE_OTHER): Payer: BLUE CROSS/BLUE SHIELD | Admitting: Orthopaedic Surgery

## 2017-06-07 DIAGNOSIS — F1120 Opioid dependence, uncomplicated: Secondary | ICD-10-CM | POA: Diagnosis not present

## 2017-06-07 DIAGNOSIS — F411 Generalized anxiety disorder: Secondary | ICD-10-CM | POA: Diagnosis not present

## 2017-06-07 DIAGNOSIS — G894 Chronic pain syndrome: Secondary | ICD-10-CM | POA: Diagnosis not present

## 2017-06-07 DIAGNOSIS — M545 Low back pain: Secondary | ICD-10-CM | POA: Diagnosis not present

## 2017-06-14 ENCOUNTER — Inpatient Hospital Stay (INDEPENDENT_AMBULATORY_CARE_PROVIDER_SITE_OTHER): Payer: BLUE CROSS/BLUE SHIELD | Admitting: Orthopaedic Surgery

## 2017-06-24 DIAGNOSIS — M545 Low back pain: Secondary | ICD-10-CM | POA: Diagnosis not present

## 2017-06-24 DIAGNOSIS — F411 Generalized anxiety disorder: Secondary | ICD-10-CM | POA: Diagnosis not present

## 2017-06-24 DIAGNOSIS — G894 Chronic pain syndrome: Secondary | ICD-10-CM | POA: Diagnosis not present

## 2017-06-24 DIAGNOSIS — F1120 Opioid dependence, uncomplicated: Secondary | ICD-10-CM | POA: Diagnosis not present

## 2017-07-12 ENCOUNTER — Other Ambulatory Visit: Payer: Self-pay | Admitting: General Practice

## 2017-07-12 DIAGNOSIS — F411 Generalized anxiety disorder: Secondary | ICD-10-CM | POA: Diagnosis not present

## 2017-07-12 DIAGNOSIS — M545 Low back pain: Secondary | ICD-10-CM | POA: Diagnosis not present

## 2017-07-12 DIAGNOSIS — F1120 Opioid dependence, uncomplicated: Secondary | ICD-10-CM | POA: Diagnosis not present

## 2017-07-12 DIAGNOSIS — G894 Chronic pain syndrome: Secondary | ICD-10-CM | POA: Diagnosis not present

## 2017-07-12 MED ORDER — IBUPROFEN 800 MG PO TABS: 800.0000 mg | ORAL_TABLET | Freq: Three times a day (TID) | ORAL | 5 refills | Status: DC | PRN

## 2017-07-12 NOTE — Telephone Encounter (Signed)
Last OV 03/24/17 Ibuprofen last filled 12/21/16 #30 with 5

## 2017-07-14 ENCOUNTER — Ambulatory Visit (INDEPENDENT_AMBULATORY_CARE_PROVIDER_SITE_OTHER): Payer: BLUE CROSS/BLUE SHIELD | Admitting: Orthopedic Surgery

## 2017-07-20 ENCOUNTER — Ambulatory Visit: Payer: BLUE CROSS/BLUE SHIELD | Admitting: Family Medicine

## 2017-07-20 ENCOUNTER — Other Ambulatory Visit: Payer: Self-pay | Admitting: Family Medicine

## 2017-07-21 ENCOUNTER — Ambulatory Visit: Payer: BLUE CROSS/BLUE SHIELD | Admitting: Family Medicine

## 2017-07-22 ENCOUNTER — Other Ambulatory Visit: Payer: Self-pay

## 2017-07-22 ENCOUNTER — Ambulatory Visit: Payer: BLUE CROSS/BLUE SHIELD | Admitting: Family Medicine

## 2017-07-22 ENCOUNTER — Encounter: Payer: Self-pay | Admitting: Family Medicine

## 2017-07-22 VITALS — BP 121/81 | HR 96 | Temp 97.9°F | Resp 16 | Ht 61.0 in | Wt 179.5 lb

## 2017-07-22 DIAGNOSIS — M25562 Pain in left knee: Secondary | ICD-10-CM | POA: Diagnosis not present

## 2017-07-22 DIAGNOSIS — J309 Allergic rhinitis, unspecified: Secondary | ICD-10-CM | POA: Insufficient documentation

## 2017-07-22 DIAGNOSIS — J301 Allergic rhinitis due to pollen: Secondary | ICD-10-CM | POA: Diagnosis not present

## 2017-07-22 MED ORDER — CETIRIZINE HCL 10 MG PO TABS: 10.0000 mg | ORAL_TABLET | Freq: Every day | ORAL | 11 refills | Status: DC

## 2017-07-22 NOTE — Progress Notes (Signed)
   Subjective:    Patient ID: Vanessa [name], female    DOB: Oct 15, 1960, 57 y.o.   MRN: [id number]  HPI Leg pain- L knee, 'it just hurts.  It's numb'.  Pt reports numbness is worse first thing in the morning.  Numbness is more of a burning than a unable to feel touch issue.  Burning is occurring just around the knee- not radiating up or down.  No weakness.  Has seen Dr Cleophas DunkerWhitfield previously for the R knee.  Wheezing- pt reports she has wheezing when she lies down at night.  She is no longer smoking cigarettes but she continues to vape.   Review of Systems For ROS see HPI     Objective:   Physical Exam  Constitutional: She appears well-developed and well-nourished. No distress.  HENT:  Head: Normocephalic and atraumatic.  Right Ear: Tympanic membrane normal.  Left Ear: Tympanic membrane normal.  Nose: Mucosal edema and rhinorrhea present. Right sinus exhibits no maxillary sinus tenderness and no frontal sinus tenderness. Left sinus exhibits no maxillary sinus tenderness and no frontal sinus tenderness.  Mouth/Throat: Mucous membranes are normal. Posterior oropharyngeal erythema (w/ PND) present.  Eyes: Conjunctivae and EOM are normal. Pupils are equal, round, and reactive to light.  Neck: Normal range of motion. Neck supple.  Cardiovascular: Normal rate, regular rhythm, normal heart sounds and intact distal pulses.  Pulmonary/Chest: Effort normal and breath sounds normal. No respiratory distress. She has no wheezes. She has no rales.  Musculoskeletal: She exhibits no edema or deformity.  Pain at extremes of flexion TTP posteriorly but not over medial or lateral joint lines  Lymphadenopathy:    She has no cervical adenopathy.  Vitals reviewed.         Assessment & Plan:  Posterior L knee pain- new.  Suspect pt has been overcompensating for her R knee and is now having joint pain w/ associated nerve pain (burning/numbness).  Refer back to ortho.  Ice and ibuprofen.  Reviewed  supportive care and red flags that should prompt return.  Pt expressed understanding and is in agreement w/ plan.

## 2017-07-22 NOTE — Patient Instructions (Signed)
Follow up as needed or as scheduled We'll call you with your Ortho appt for the L knee pain Start the Cetirizine daily to decrease your post nasal drip and decrease your wheezing Drink plenty of fluids Ice and ibuprofen for your knee pain Call with any questions or concerns Hang in there!!!

## 2017-07-22 NOTE — Assessment & Plan Note (Signed)
New.  Lungs are CTAB on exam- no wheezing heard.  Evidence of allergic rhinitis on exam.  Start daily antihistamine.  Suspect her 'wheezing' at night is PND collecting.  Reviewed supportive care and red flags that should prompt return.  Pt expressed understanding and is in agreement w/ plan.

## 2017-07-27 ENCOUNTER — Ambulatory Visit (INDEPENDENT_AMBULATORY_CARE_PROVIDER_SITE_OTHER): Payer: BLUE CROSS/BLUE SHIELD

## 2017-07-27 ENCOUNTER — Encounter (INDEPENDENT_AMBULATORY_CARE_PROVIDER_SITE_OTHER): Payer: Self-pay | Admitting: Orthopaedic Surgery

## 2017-07-27 ENCOUNTER — Ambulatory Visit (INDEPENDENT_AMBULATORY_CARE_PROVIDER_SITE_OTHER): Payer: BLUE CROSS/BLUE SHIELD | Admitting: Orthopaedic Surgery

## 2017-07-27 VITALS — BP 154/93 | HR 84 | Ht 61.0 in | Wt 180.0 lb

## 2017-07-27 DIAGNOSIS — M545 Low back pain: Secondary | ICD-10-CM | POA: Diagnosis not present

## 2017-07-27 DIAGNOSIS — G8929 Other chronic pain: Secondary | ICD-10-CM | POA: Diagnosis not present

## 2017-07-27 DIAGNOSIS — M25561 Pain in right knee: Secondary | ICD-10-CM | POA: Diagnosis not present

## 2017-07-27 DIAGNOSIS — M79605 Pain in left leg: Secondary | ICD-10-CM | POA: Diagnosis not present

## 2017-07-27 MED ORDER — BUPIVACAINE HCL 0.5 % IJ SOLN
2.0000 mL | INTRAMUSCULAR | Status: AC | PRN
  Administered 2017-07-27: 2 mL via INTRA_ARTICULAR

## 2017-07-27 MED ORDER — METHYLPREDNISOLONE ACETATE 40 MG/ML IJ SUSP
80.0000 mg | INTRAMUSCULAR | Status: AC | PRN
  Administered 2017-07-27: 80 mg

## 2017-07-27 MED ORDER — LIDOCAINE HCL 1 % IJ SOLN
2.0000 mL | INTRAMUSCULAR | Status: AC | PRN
  Administered 2017-07-27: 2 mL

## 2017-07-27 NOTE — Progress Notes (Signed)
Office Visit Note   Patient: Vanessa [name]           Date of Birth: 04/06/1961           MRN: [id number] Visit Date: 07/27/2017              Requested by: Sheliah Hatch, MD [address] [place], [place] [address] PCP: Sheliah Hatch, MD   Assessment & Plan: Visit Diagnoses:  1. Acute bilateral low back pain, with sciatica presence unspecified   2. Pain in left leg   3. Chronic pain of right knee     Plan: Chronic right knee pain as previously outlined. There is a combination of osteoarthritis and meniscal tear. We've scheduled surgery but for financial reasons she wanted to "hold". I've injected her knee today with cortisone. I do not have a specific diagnosis for the stocking glove distribution of numbness burning and tingling in her left leg from about the mid thigh distally. There certainly is a possibility radiates from the lumbar spine nor she's not having much back pain. This could be a vascular etiology but her feet were warm bilaterally. She had +1 pulses. She does smoke. She notes that it is more of a nuisance. There is no evidence of any neurologic deficit. She is relieved with ibuprofen. We'll just monitor  the numbness and tingling and see if her symptoms either resolve or progress and then consider further diagnostic testing. She was fine with that. Her exam today was benign  Follow-Up Instructions: Return if symptoms worsen or fail to improve.   Orders:  Orders Placed This Encounter  Procedures  . Large Joint Inj: R knee  . XR Lumbar Spine 2-3 Views   No orders of the defined types were placed in this encounter.     Procedures: Large Joint Inj: R knee on 07/27/2017 4:11 PM Indications: pain and diagnostic evaluation Details: 25 G 1.5 in needle, anteromedial approach  Arthrogram: No  Medications: 2 mL lidocaine 1 %; 2 mL bupivacaine 0.5 %; 80 mg methylPREDNISolone acetate 40 MG/ML Procedure, treatment alternatives, risks and benefits explained,  specific risks discussed. Consent was given by the patient. Immediately prior to procedure a time out was called to verify the correct patient, procedure, equipment, support staff and site/side marked as required. Patient was prepped and draped in the usual sterile fashion.       Clinical Data: No additional findings.   Subjective: Chief Complaint  Patient presents with  . Left Knee - Pain    Left knee pain for 1 month, no xrays, injections or injury.  Insidious onset of left leg pain, numbness and tingling about a month ago without injury or trauma. Mrs. Vanessa [name] relates that she has the discomfort when she is up and about. She doesn't appear to have any pain when she sleeping or when she sitting. That the numbness and tingling and an even "burning" begins the distal thigh and extends to the left foot. She has noticed any swelling. There's been no ecchymosis. She denies any groin pain. Not having any significant back pain. She doesn't smoke but she does "vap". Has chronic pain in her right knee with a combination of meniscal tear and osteoarthritis. She recently was evaluated by her primary care physician who  suggested she come to the office for further evaluation of the left leg pain. Presently she is not having any pain, numbness or tingling. She is not diabetic  HPI  Review of Systems  Constitutional: Negative for fatigue and fever.  HENT: Negative for ear pain and tinnitus.   Eyes: Negative for pain.  Respiratory: Negative for cough and shortness of breath.   Cardiovascular: Positive for leg swelling. Negative for chest pain and palpitations.  Gastrointestinal: Negative for blood in stool, constipation and diarrhea.  Genitourinary: Negative for dysuria.  Musculoskeletal: Negative for back pain and neck pain.  Skin: Negative for rash and wound.  Allergic/Immunologic: Negative for food allergies.  Neurological: Positive for weakness and numbness. Negative for dizziness,  light-headedness and headaches.  Hematological: Bruises/bleeds easily.  Psychiatric/Behavioral: Negative for sleep disturbance.     Objective: Vital Signs: BP (!) 154/93 (BP Location: Left Arm, Patient Position: Sitting, Cuff Size: Normal)   Pulse 84   Ht 5\' 1"  (1.549 m)   Wt 180 lb (81.6 kg)   BMI 34.01 kg/m   Physical Exam  Ortho Exam awake alert and oriented 3. Comfortable sitting. No related pain and left lower extremity at the time of this exam. Straight leg raise negative. Painless range of motion of both hips with internal/external rotation. Her reflexes were symmetrical both knees slightly decreased in the left ankle compared to the right. Feet were warm. I didn't feel very good pulses. Nails were intact. No ankle pain. No calf pain. No left knee pain. No percussible tenderness lumbar spine.  Specialty Comments:  No specialty comments available.  Imaging: Xr Lumbar Spine 2-3 Views  Result Date: 07/27/2017 Films of the lumbar spine obtained in the AP and lateral projections. There is no evidence for scoliosis. 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 There are 5 non-rib-bearing lumbar vertebrae. There are some sclerotic changes at L4-5 and L5-S1. There is slight anterior listhesis of L4 on 5. Some anterior osteophytes at L2 L3 L4 and L4-5. The space height appears to be maintained. No evidence of fracture.    PMFS History: Patient Active Problem List   Diagnosis Date Noted  . Allergic rhinitis 07/22/2017  . Tear of medial meniscus of right knee, current 03/03/2017  . Tear of lateral meniscus of right knee, current 03/03/2017  . Vitamin D deficiency 12/04/2016  . Baker's cyst of knee, right 05/21/2016  . Edema 12/03/2015  . Liver fibrosis 07/25/2015  . Corneal ulcers and infections 02/01/2015  . Uveitis 02/01/2015  . S/P laparoscopic cholecystectomy 11/25/2014  . Knee pain, right 07/17/2014  . Physical exam 07/17/2014  . Pap smear for cervical cancer screening  07/17/2014  . Wheezing 04/06/2014  . Other and unspecified hyperlipidemia 01/12/2014  . Chronic hepatitis C (HCC) 04/27/2013  . 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 Hypertension   . Depression    Past Medical History:  Diagnosis Date  . Anxiety   . Chronic knee pain   . Depression   . Gall stone   . Hepatitis C   . Hypertension   . Syphilis     Family History  Problem Relation Age of Onset  . Hypertension Mother   . Diabetes Mother   . Drug abuse Brother   . Esophageal cancer Brother   . Colon cancer Brother   . Hypertension Sister        x's 2  . Diabetes Sister        oldest sister  . Stomach cancer Neg Hx     Past Surgical History:  Procedure Laterality Date  . CARPAL TUNNEL RELEASE Left   . CHOLECYSTECTOMY N/A 11/24/2014   Procedure: LAPAROSCOPIC CHOLECYSTECTOMY WITH INTRAOPERATIVE CHOLANGIOGRAM;  Surgeon: Minerva AreolaEric  Andrey Campanile, MD;  Location: [facility];  Service: General;  Laterality: N/A;  . ECTOPIC PREGNANCY SURGERY     Social History   Occupational History  . Occupation: Dialysis Tech  Tobacco Use  . Smoking status: Former Smoker    Packs/day: 1.00    Years: 35.00    Pack years: 35.00    Types: E-cigarettes    Start date: 05/25/1994    Last attempt to quit: 03/03/2016    Years since quitting: 1.4  . Smokeless tobacco: Never Used  Substance and Sexual Activity  . Alcohol use: No    Alcohol/week: 0.0 oz    Comment: "last alcohol was in the 1990's"  . Drug use: No    Comment: "last drug use was in the 1990's; never used heroin"  . Sexual activity: Not Currently

## 2017-08-18 ENCOUNTER — Other Ambulatory Visit: Payer: Self-pay | Admitting: Family Medicine

## 2017-08-25 ENCOUNTER — Other Ambulatory Visit: Payer: Self-pay | Admitting: Family Medicine

## 2017-08-31 ENCOUNTER — Encounter: Payer: Self-pay | Admitting: Physician Assistant

## 2017-08-31 ENCOUNTER — Ambulatory Visit: Payer: BLUE CROSS/BLUE SHIELD | Admitting: Physician Assistant

## 2017-08-31 ENCOUNTER — Other Ambulatory Visit: Payer: Self-pay

## 2017-08-31 VITALS — BP 128/84 | HR 92 | Temp 98.6°F | Resp 14 | Ht 61.0 in | Wt 181.0 lb

## 2017-08-31 DIAGNOSIS — Z0289 Encounter for other administrative examinations: Secondary | ICD-10-CM

## 2017-08-31 DIAGNOSIS — B9689 Other specified bacterial agents as the cause of diseases classified elsewhere: Secondary | ICD-10-CM

## 2017-08-31 DIAGNOSIS — J019 Acute sinusitis, unspecified: Secondary | ICD-10-CM | POA: Diagnosis not present

## 2017-08-31 MED ORDER — AMOXICILLIN-POT CLAVULANATE 875-125 MG PO TABS: 1.0000 | ORAL_TABLET | Freq: Two times a day (BID) | ORAL | 0 refills | Status: DC

## 2017-08-31 MED ORDER — FLUTICASONE PROPIONATE 50 MCG/ACT NA SUSP: 2.0000 | Freq: Every day | NASAL | 0 refills | Status: DC

## 2017-08-31 NOTE — Progress Notes (Signed)
Patient presents to clinic today c/o 4 weeks of sinus pressure and nasal congestion with pnd. Notes over the past several days, symptoms have worsened. Is now noting facial pain, headache and sinus pain. Denies ear pain or tooth pain. Notes chills but denies fever. Notes cough that is 2/2 to drainage. Notes mucous with coloration to it. Denies recent travel or sick contact. Has taken Ibuprofen since this morning. Is not taking any allergy medications currently despite history of allergic rhinitis.   Past Medical History:  Diagnosis Date  . Anxiety   . Chronic knee pain   . Depression   . Gall stone   . Hepatitis C   . Hypertension   . Syphilis     Current Outpatient Medications on File Prior to Visit  Medication Sig Dispense Refill  . ALPRAZolam (XANAX) 0.5 MG tablet Take 1 tablet (0.5 mg total) by mouth 2 (two) times daily as needed for anxiety. 60 tablet 1  . benazepril-hydrochlorthiazide (LOTENSIN HCT) 10-12.5 MG tablet TAKE 1 TABLET BY MOUTH EVERY DAY 90 tablet 0  . ibuprofen (ADVIL,MOTRIN) 800 MG tablet Take 1 tablet (800 mg total) by mouth 3 (three) times daily as needed. 30 tablet 5  . meloxicam (MOBIC) 7.5 MG tablet Take 1 tablet (7.5 mg total) by mouth daily. 30 tablet 0  . SUBOXONE 8-2 MG FILM Place 0.5 strips under the tongue 2 (two) times daily.    Marland Kitchen. venlafaxine XR (EFFEXOR-XR) 150 MG 24 hr capsule TAKE 1 CAPSULE (150 MG TOTAL) BY MOUTH DAILY WITH BREAKFAST. 90 capsule 0  . venlafaxine XR (EFFEXOR-XR) 75 MG 24 hr capsule PLEASE SEE ATTACHED FOR DETAILED DIRECTIONS 30 capsule 3  . furosemide (LASIX) 40 MG tablet Take 1 tablet (40 mg total) by mouth daily. (Patient not taking: Reported on 07/27/2017) 30 tablet 3   No current facility-administered medications on file prior to visit.     No Known Allergies  Family History  Problem Relation Age of Onset  . Hypertension Mother   . Diabetes Mother   . Drug abuse Brother   . Esophageal cancer Brother   . Colon cancer  Brother   . Hypertension Sister        x's 2  . Diabetes Sister        oldest sister  . Stomach cancer Neg Hx     Social History   Socioeconomic History  . Marital status: Single    Spouse name: Not on file  . Number of children: 1  . Years of education: Not on file  . Highest education level: Not on file  Occupational History  . Occupation: Dialysis Tech  Social Needs  . Financial resource strain: Not on file  . Food insecurity:    Worry: Not on file    Inability: Not on file  . Transportation needs:    Medical: Not on file    Non-medical: Not on file  Tobacco Use  . Smoking status: Former Smoker    Packs/day: 1.00    Years: 35.00    Pack years: 35.00    Types: E-cigarettes    Start date: 05/25/1994    Last attempt to quit: 03/03/2016    Years since quitting: 1.4  . Smokeless tobacco: Never Used  Substance and Sexual Activity  . Alcohol use: No    Alcohol/week: 0.0 oz    Comment: "last alcohol was in the 1990's"  . Drug use: No    Types: Marijuana, "Crack" cocaine    Comment: "  last drug use was in the 1990's; never used heroin"  . Sexual activity: Not Currently  Lifestyle  . Physical activity:    Days per week: Not on file    Minutes per session: Not on file  . Stress: Not on file  Relationships  . Social connections:    Talks on phone: Not on file    Gets together: Not on file    Attends religious service: Not on file    Active member of club or organization: Not on file    Attends meetings of clubs or organizations: Not on file    Relationship status: Not on file  Other Topics Concern  . Not on file  Social History Narrative   Works at [employer] in [employer]. Lives with daughter.   Review of Systems - See HPI.  All other ROS are negative.  BP 128/84   Pulse 92   Temp 98.6 F (37 C) (Oral)   Resp 14   Ht 5\' 1"  (1.549 m)   Wt 181 lb (82.1 kg)   SpO2 98%   BMI 34.20 kg/m   Physical Exam  Constitutional: She is oriented to person, place, and  time. She appears well-developed and well-nourished.  HENT:  Head: Normocephalic and atraumatic.  Right Ear: Tympanic membrane normal.  Left Ear: Tympanic membrane normal.  Nose: Mucosal edema and rhinorrhea present. Right sinus exhibits frontal sinus tenderness. Left sinus exhibits frontal sinus tenderness.  Mouth/Throat: Uvula is midline, oropharynx is clear and moist and mucous membranes are normal.  Eyes: Pupils are equal, round, and reactive to light.  Cardiovascular: Normal rate, regular rhythm, normal heart sounds and intact distal pulses.  Pulmonary/Chest: Effort normal and breath sounds normal.  Neurological: She is alert and oriented to person, place, and time.   Assessment/Plan: 1. Acute bacterial sinusitis Rx Augmentin.  Increase fluids.  Rest.  Saline nasal spray.  Probiotic.  Mucinex as directed.  Humidifier in bedroom. Rx Flonase.  Call or return to clinic if symptoms are not improving.  - amoxicillin-clavulanate (AUGMENTIN) 875-125 MG tablet; Take 1 tablet by mouth 2 (two) times daily.  Dispense: 14 tablet; Refill: 0 - fluticasone (FLONASE) 50 MCG/ACT nasal spray; Place 2 sprays into both nostrils daily.  Dispense: 16 g; Refill: 0   Piedad Climes, PA-C

## 2017-08-31 NOTE — Patient Instructions (Signed)
Please take antibiotic as directed.  Increase fluid intake.  Use Saline nasal spray.  Take a daily multivitamin. Start the [employer] Industries[employer]onase daily as well.  Place a humidifier in the bedroom.  Please call or return clinic if symptoms are not improving.  Sinusitis Sinusitis is redness, soreness, and swelling (inflammation) of the paranasal sinuses. Paranasal sinuses are air pockets within the bones of your face (beneath the eyes, the middle of the forehead, or above the eyes). In healthy paranasal sinuses, mucus is able to drain out, and air is able to circulate through them by way of your nose. However, when your paranasal sinuses are inflamed, mucus and air can become trapped. This can allow bacteria and other germs to grow and cause infection. Sinusitis can develop quickly and last only a short time (acute) or continue over a long period (chronic). Sinusitis that lasts for more than 12 weeks is considered chronic.  CAUSES  Causes of sinusitis include:  Allergies.  Structural abnormalities, such as displacement of the cartilage that separates your nostrils (deviated septum), which can decrease the air flow through your nose and sinuses and affect sinus drainage.  Functional abnormalities, such as when the small hairs (cilia) that line your sinuses and help remove mucus do not work properly or are not present. SYMPTOMS  Symptoms of acute and chronic sinusitis are the same. The primary symptoms are pain and pressure around the affected sinuses. Other symptoms include:  Upper toothache.  Earache.  Headache.  Bad breath.  Decreased sense of smell and taste.  A cough, which worsens when you are lying flat.  Fatigue.  Fever.  Thick drainage from your nose, which often is green and may contain pus (purulent).  Swelling and warmth over the affected sinuses. DIAGNOSIS  Your caregiver will perform a physical exam. During the exam, your caregiver may:  Look in your nose for signs of abnormal  growths in your nostrils (nasal polyps).  Tap over the affected sinus to check for signs of infection.  View the inside of your sinuses (endoscopy) with a special imaging device with a light attached (endoscope), which is inserted into your sinuses. If your caregiver suspects that you have chronic sinusitis, one or more of the following tests may be recommended:  Allergy tests.  Nasal culture A sample of mucus is taken from your nose and sent to a lab and screened for bacteria.  Nasal cytology A sample of mucus is taken from your nose and examined by your caregiver to determine if your sinusitis is related to an allergy. TREATMENT  Most cases of acute sinusitis are related to a viral infection and will resolve on their own within 10 days. Sometimes medicines are prescribed to help relieve symptoms (pain medicine, decongestants, nasal steroid sprays, or saline sprays).  However, for sinusitis related to a bacterial infection, your caregiver will prescribe antibiotic medicines. These are medicines that will help kill the bacteria causing the infection.  Rarely, sinusitis is caused by a fungal infection. In theses cases, your caregiver will prescribe antifungal medicine. For some cases of chronic sinusitis, surgery is needed. Generally, these are cases in which sinusitis recurs more than 3 times per year, despite other treatments. HOME CARE INSTRUCTIONS   Drink plenty of water. Water helps thin the mucus so your sinuses can drain more easily.  Use a humidifier.  Inhale steam 3 to 4 times a day (for example, sit in the bathroom with the shower running).  Apply a warm, moist washcloth to your face  3 to 4 times a day, or as directed by your caregiver.  Use saline nasal sprays to help moisten and clean your sinuses.  Take over-the-counter or prescription medicines for pain, discomfort, or fever only as directed by your caregiver. SEEK IMMEDIATE MEDICAL CARE IF:  You have increasing pain or  severe headaches.  You have nausea, vomiting, or drowsiness.  You have swelling around your face.  You have vision problems.  You have a stiff neck.  You have difficulty breathing. MAKE SURE YOU:   Understand these instructions.  Will watch your condition.  Will get help right away if you are not doing well or get worse. Document Released: 05/11/2005 Document Revised: 08/03/2011 Document Reviewed: 05/26/2011 Exit[employer] Patient Information 2014 [employer].   

## 2017-09-06 DIAGNOSIS — F1120 Opioid dependence, uncomplicated: Secondary | ICD-10-CM | POA: Diagnosis not present

## 2017-09-06 DIAGNOSIS — M545 Low back pain: Secondary | ICD-10-CM | POA: Diagnosis not present

## 2017-09-06 DIAGNOSIS — G894 Chronic pain syndrome: Secondary | ICD-10-CM | POA: Diagnosis not present

## 2017-09-06 DIAGNOSIS — F411 Generalized anxiety disorder: Secondary | ICD-10-CM | POA: Diagnosis not present

## 2017-09-13 IMAGING — MR MR KNEE*R* W/O CM
6 series · 39 of 40 positions shown · non-contrast
Comparison: Plain films right knee performed at [REDACTED] 06/22/2016.

CLINICAL DATA: Right knee pain and swelling for 5 months. No known
injury.

EXAM:
MRI OF THE RIGHT KNEE WITHOUT CONTRAST
TECHNIQUE: Multiplanar, multisequence MR imaging of the knee was performed. No
intravenous contrast was administered.

[Series 6: PD · axial · 4.0mm · 0.42mm/px · z∈[-70,+39]mm · 7 of 24 slices shown (1 of 2)]
[im 1/24]
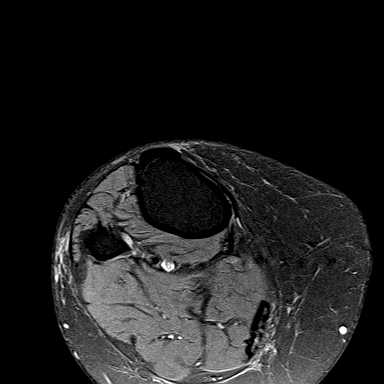
[im 4/24]
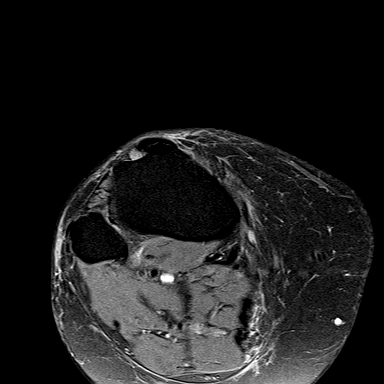
[im 8/24]
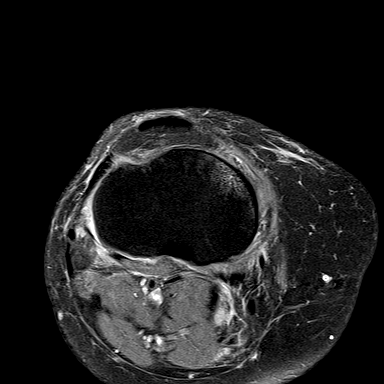
[im 12/24]
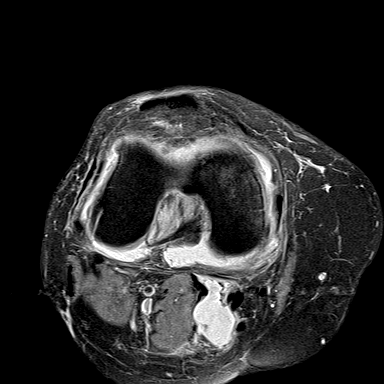
[im 16/24]
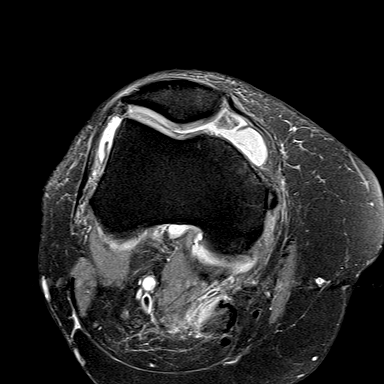
[im 20/24]
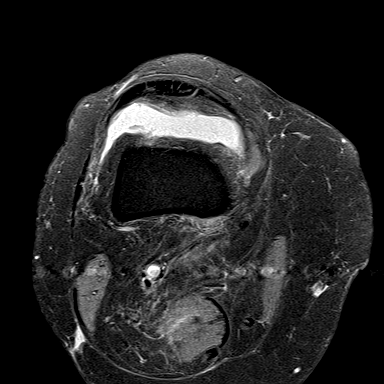
[im 24/24]
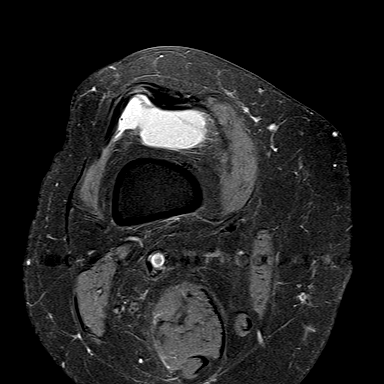

[Series 7: PD · coronal · 4.0mm · 0.44mm/px · 7 of 20 slices shown (2 of 2)]
[im 1/20]
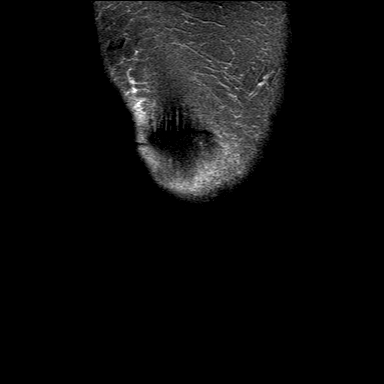
[im 4/20]
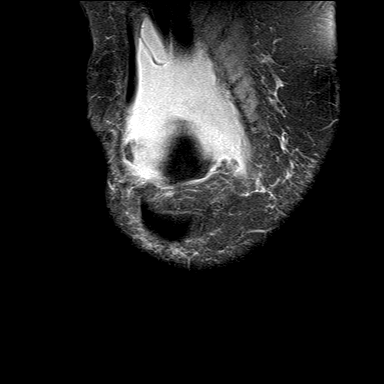
[im 7/20]
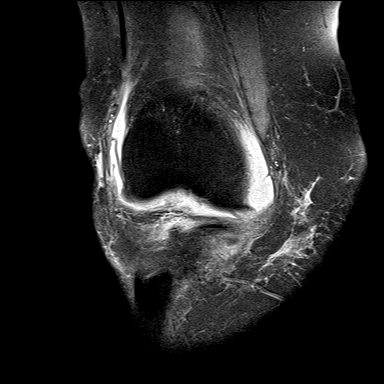
[im 10/20]
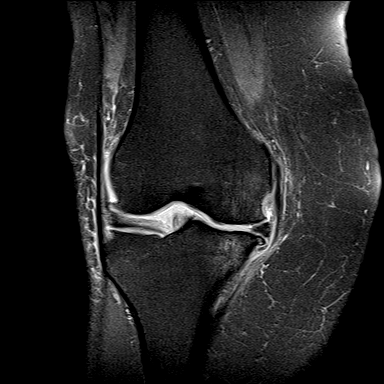
[im 13/20]
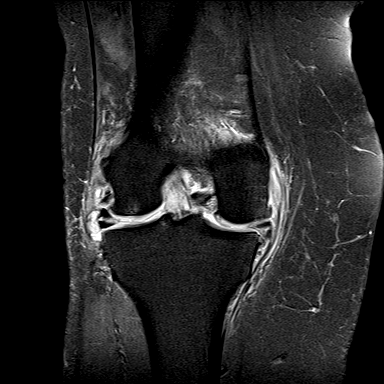
[im 16/20]
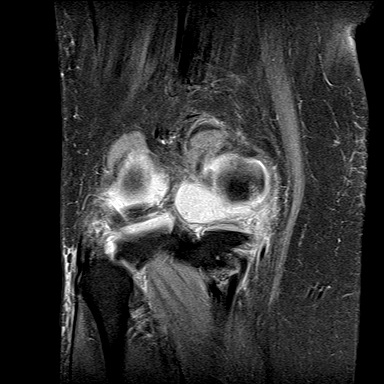
[im 20/20]
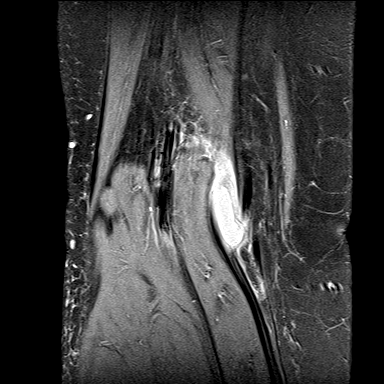

[Series 8: T1 · coronal · 4.0mm · 0.44mm/px · 7 of 20 slices shown]
[im 1/20]
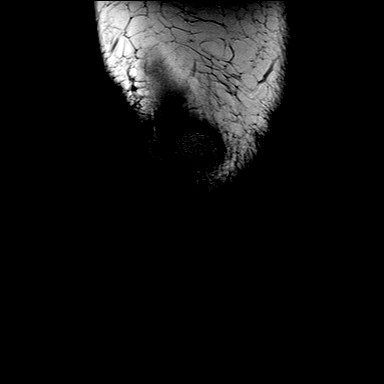
[im 4/20]
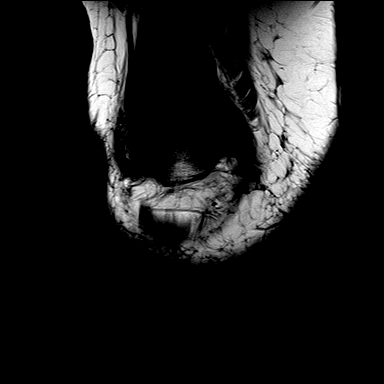
[im 7/20]
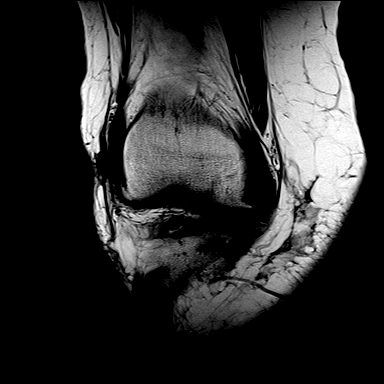
[im 10/20]
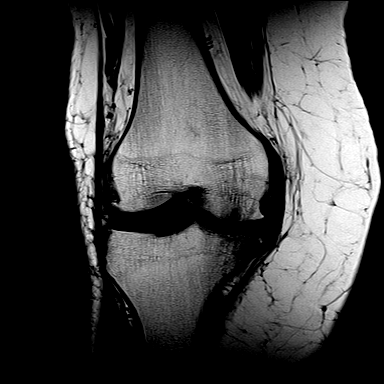
[im 13/20]
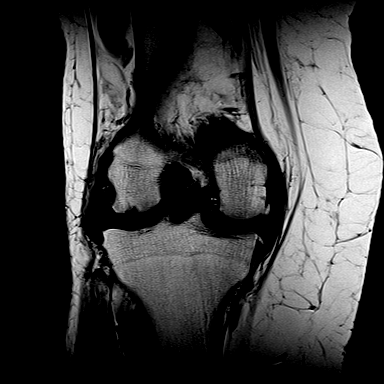
[im 16/20]
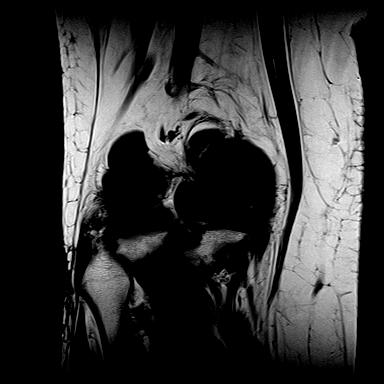
[im 20/20]
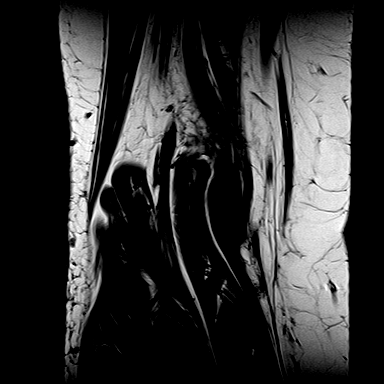

[Series 9: PD fat-sat · sagittal · 4.0mm · 0.50mm/px · 7 of 21 slices shown]
[im 1/21]
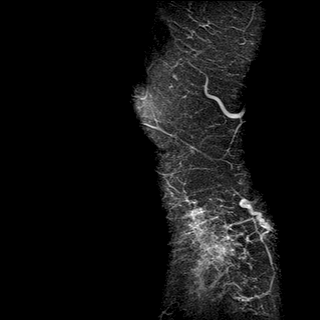
[im 4/21]
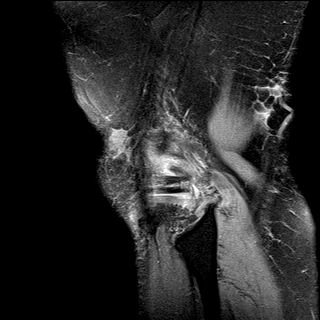
[im 7/21]
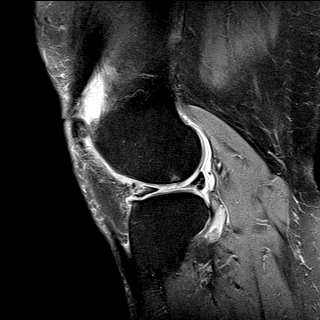
[im 11/21]
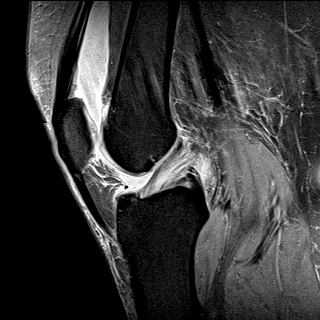
[im 14/21]
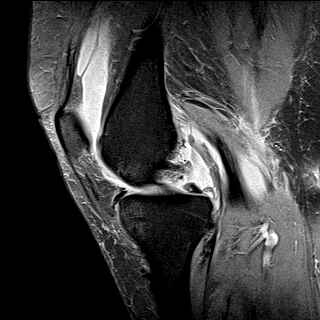
[im 17/21]
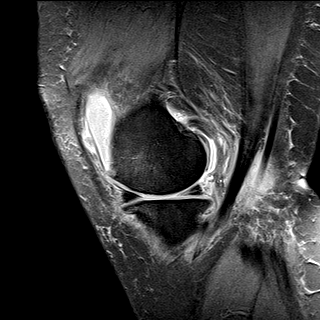
[im 21/21]
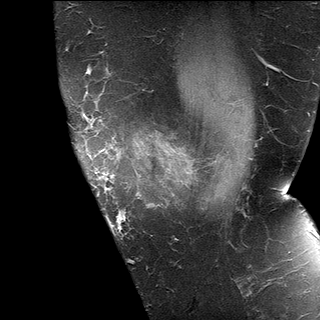

[Series 10: (id) fs · coronal · 4.0mm · 0.53mm/px · 7 of 20 slices shown]
[im 1/20]
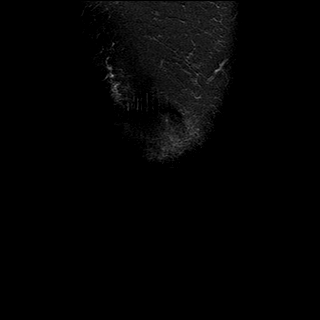
[im 4/20]
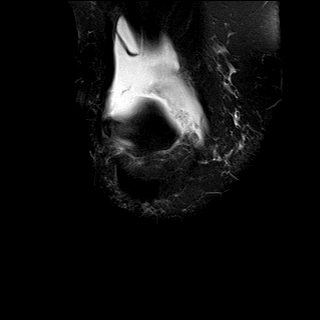
[im 7/20]
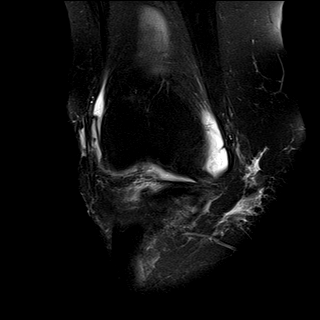
[im 10/20]
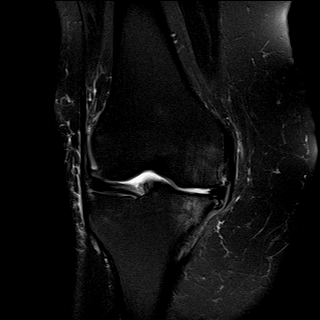
[im 13/20]
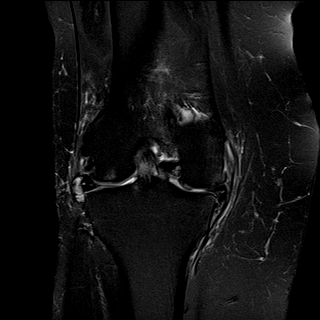
[im 16/20]
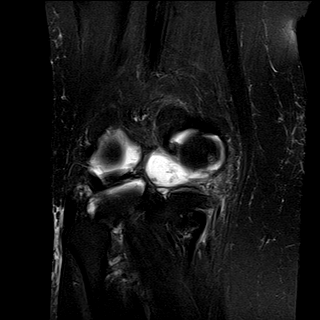
[im 20/20]
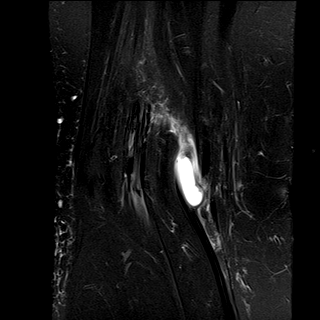

[Series 11: cor acl 2mm · oblique · 2.0mm · 0.59mm/px · 4 of 16 slices shown]
[im 1/16]
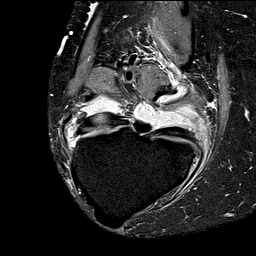
[im 4/16]
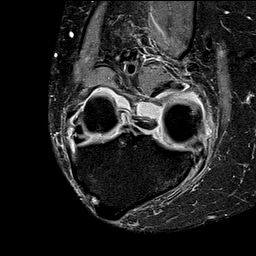
[im 8/16]
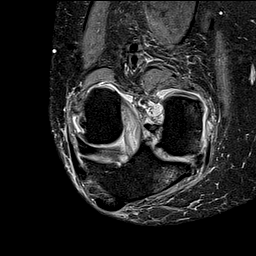
[im 12/16]
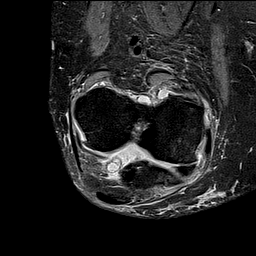

[39 of 40 positions shown; findings below may reference images not displayed]

FINDINGS: MENISCI

Medial meniscus: Horizontal tear in the posterior horn reaches the
meniscal undersurface. The body is severely degenerated with marked
blunting along its free edge throughout. The body is extruded
peripherally.

Lateral meniscus: Horizontal tear in the posterior horn reaches the
meniscal undersurface and extends into the posterior body.

LIGAMENTS

Cruciates:  Intact.  Mucoid degeneration of the ACL is noted.

Collaterals:  Intact.

CARTILAGE

Patellofemoral: Focal full-thickness fissures in cartilage are seen
in the central and medial femoral trochlea. There is some blistering
of cartilage at the apex of the patella in the midpole.

Medial: Markedly thinned throughout with associated joint space
narrowing.

Lateral:  Thinned and irregular throughout.

Joint:  Moderate to moderately large joint effusion.

Popliteal Fossa: Baker's cyst measures approximately 3 cm AP by
cm transverse by 3.3 cm craniocaudal.

Extensor Mechanism:  Intact.

Bones: Marked subchondral edema is present about the medial
compartment. Small focus of subchondral edema is also seen in the
weight-bearing surface of the lateral femoral condyle.
Tricompartmental osteophytes are noted.

Other: None.
IMPRESSION: Dominant finding is advanced for age osteoarthritis about the knee
appearing worst in the medial compartment.

Horizontal tears in the posterior horns and posterior bodies of both
the medial and lateral menisci. The body of the medial meniscus is
severely degenerated and diminutive.

Moderate to moderately large joint effusion.

Baker's cyst.

## 2017-09-27 ENCOUNTER — Other Ambulatory Visit: Payer: Self-pay | Admitting: Physician Assistant

## 2017-09-27 DIAGNOSIS — B9689 Other specified bacterial agents as the cause of diseases classified elsewhere: Secondary | ICD-10-CM

## 2017-09-27 DIAGNOSIS — J019 Acute sinusitis, unspecified: Principal | ICD-10-CM

## 2017-11-08 IMAGING — CR DG FOOT COMPLETE 3+V*L*
3 series · 3 of 3 positions shown · non-contrast
Comparison: 07/16/2012

CLINICAL DATA: Left anterior foot pain x2 days

EXAM:
LEFT FOOT - COMPLETE 3+ VIEW

[x foot ap left]
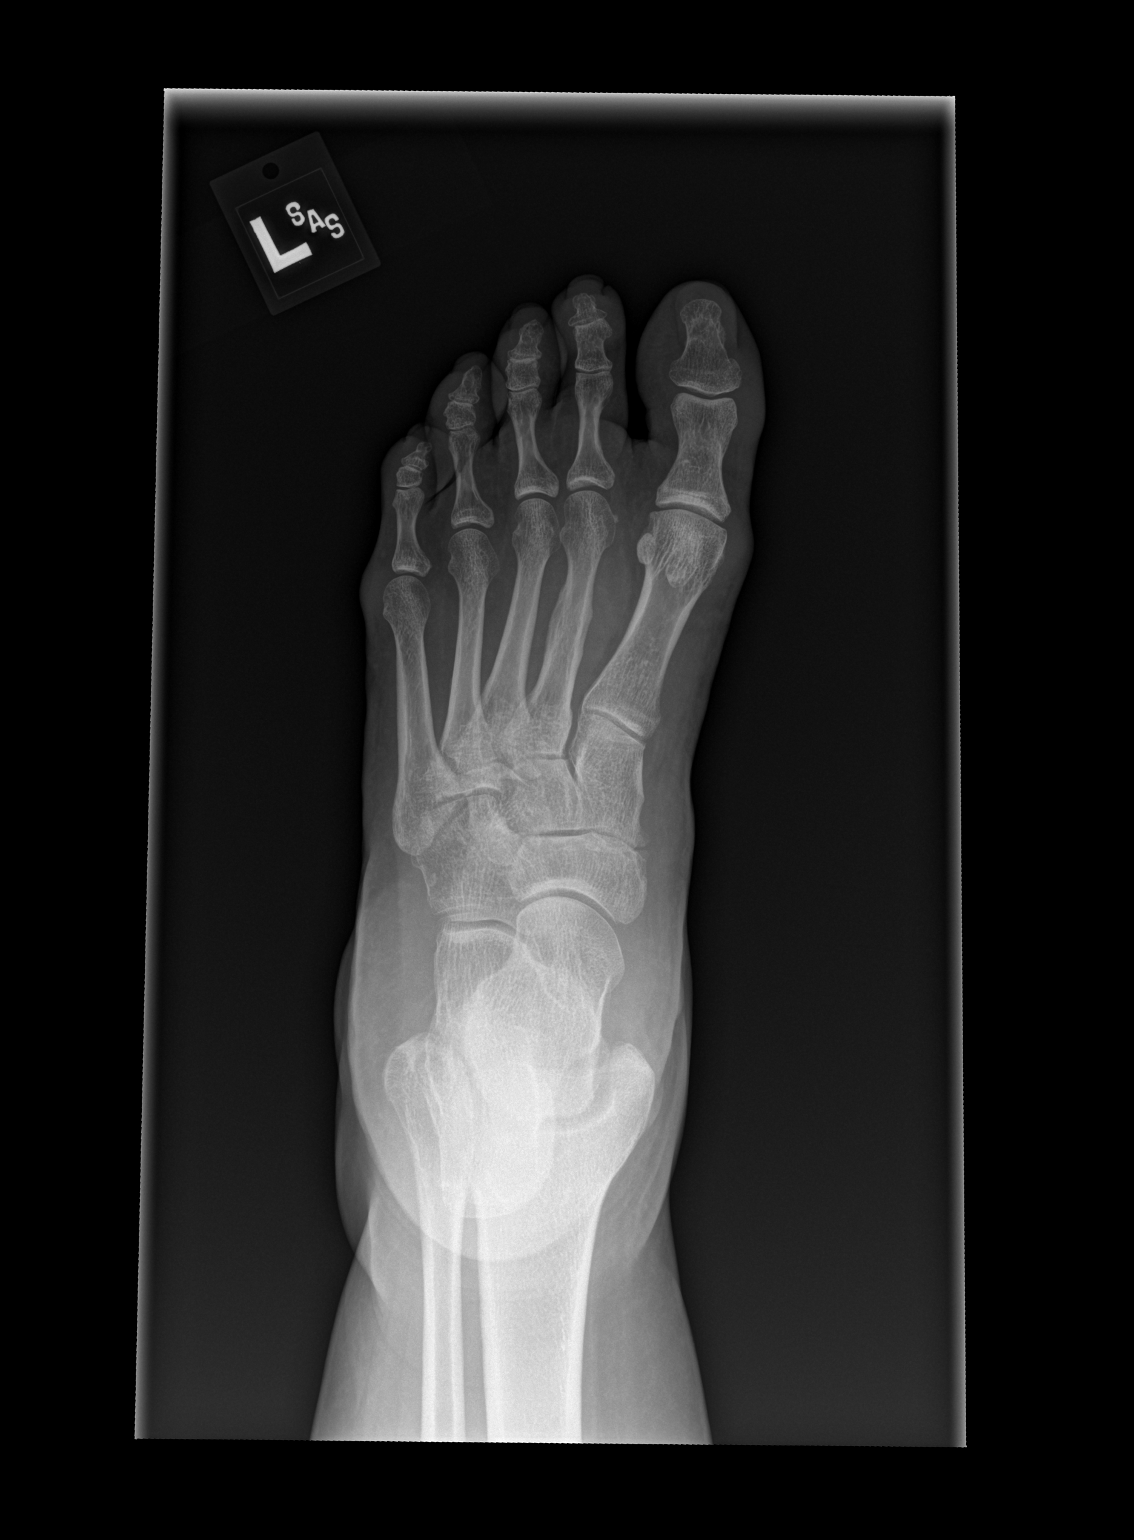

[x foot obl left]
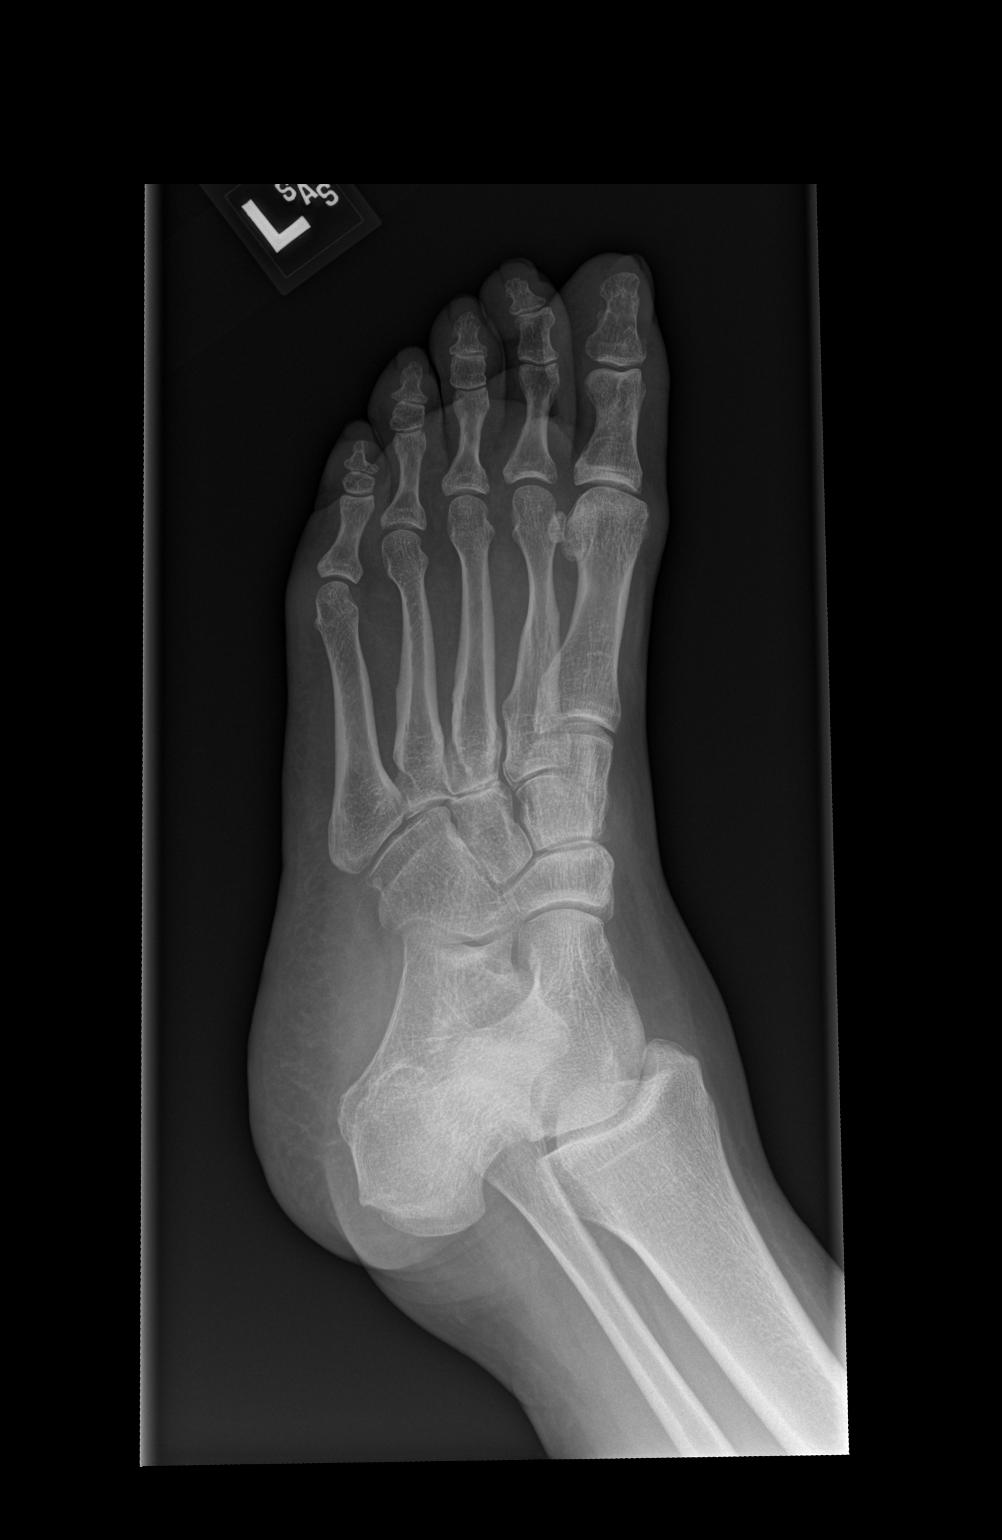

[x foot lat left]
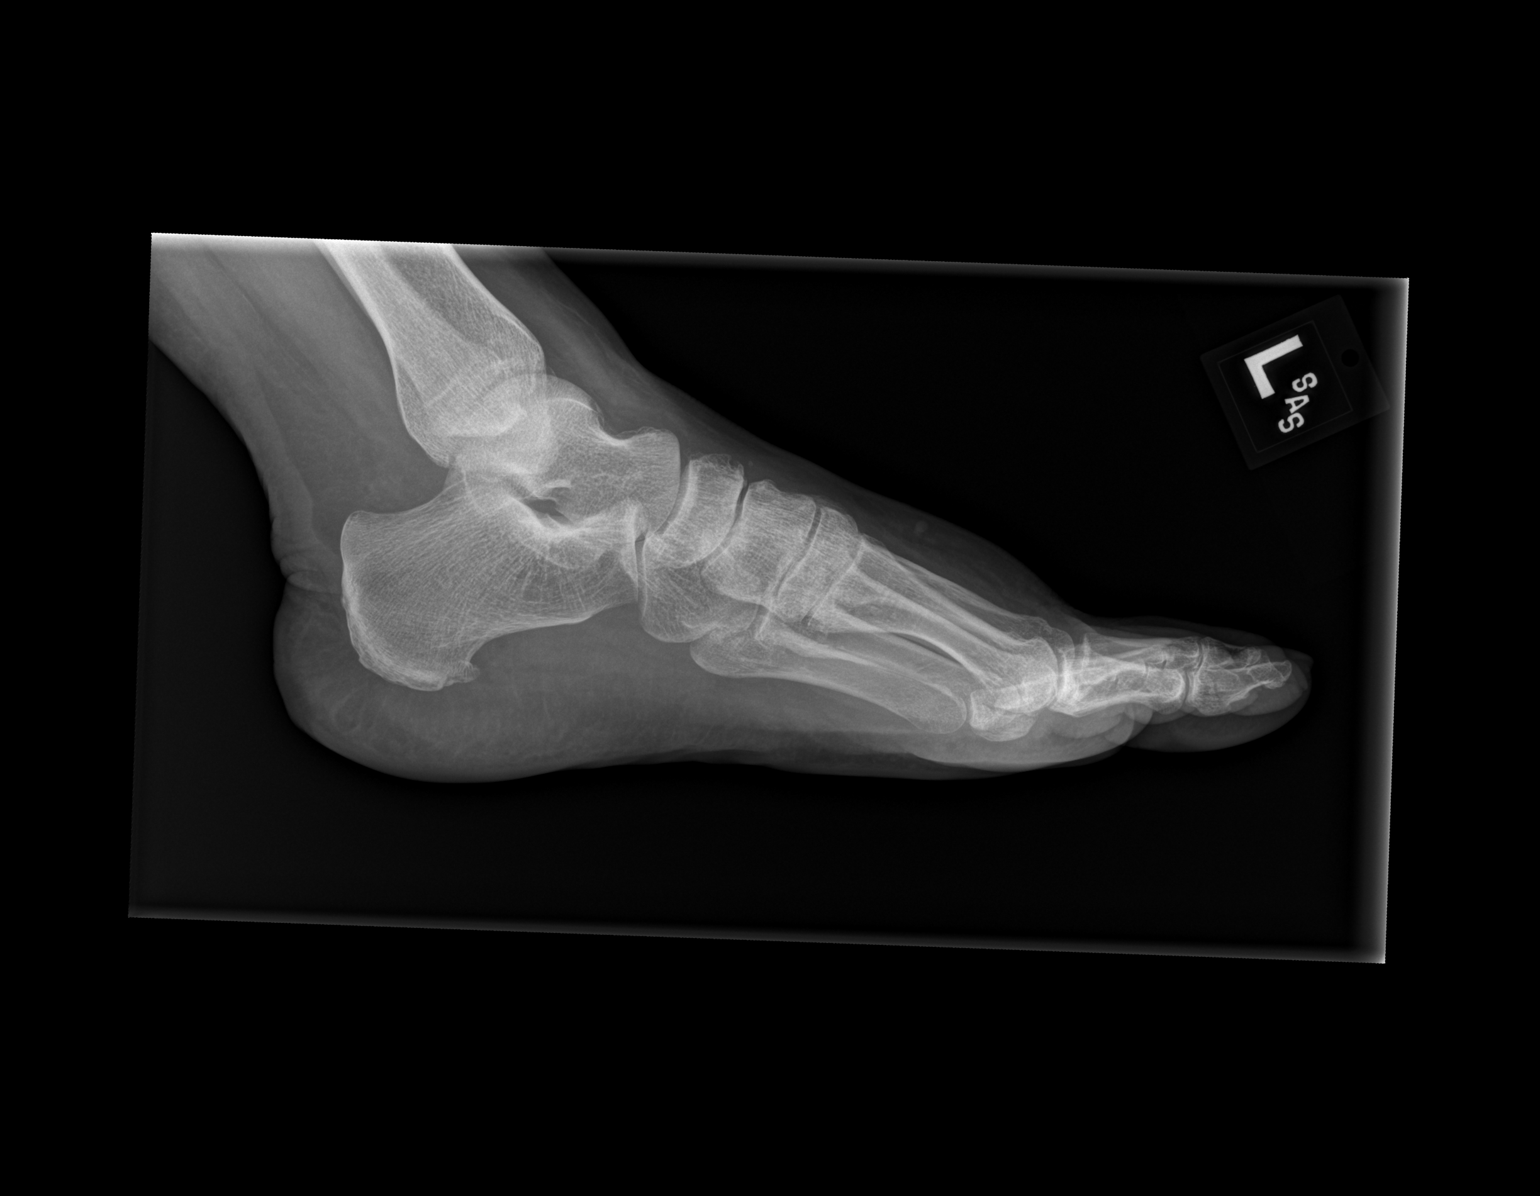

[3 of 3 positions shown; findings below may reference images not displayed]

FINDINGS: No fracture or dislocation is seen.

The joint spaces are preserved.

Mild dorsal soft tissue swelling.

Small plantar calcaneal enthesophyte.
IMPRESSION: No acute osseus abnormality is seen.

## 2017-11-15 DIAGNOSIS — F411 Generalized anxiety disorder: Secondary | ICD-10-CM | POA: Diagnosis not present

## 2017-11-15 DIAGNOSIS — M545 Low back pain: Secondary | ICD-10-CM | POA: Diagnosis not present

## 2017-11-15 DIAGNOSIS — F1120 Opioid dependence, uncomplicated: Secondary | ICD-10-CM | POA: Diagnosis not present

## 2017-11-15 DIAGNOSIS — G894 Chronic pain syndrome: Secondary | ICD-10-CM | POA: Diagnosis not present

## 2017-11-18 ENCOUNTER — Other Ambulatory Visit: Payer: Self-pay | Admitting: Family Medicine

## 2017-11-19 ENCOUNTER — Other Ambulatory Visit: Payer: Self-pay | Admitting: Family Medicine

## 2017-11-22 NOTE — Telephone Encounter (Signed)
Please advise on 11/18/17 pt had 75 mg rx filled.

## 2017-11-30 DIAGNOSIS — F1120 Opioid dependence, uncomplicated: Secondary | ICD-10-CM | POA: Diagnosis not present

## 2017-11-30 DIAGNOSIS — M545 Low back pain: Secondary | ICD-10-CM | POA: Diagnosis not present

## 2017-11-30 DIAGNOSIS — F411 Generalized anxiety disorder: Secondary | ICD-10-CM | POA: Diagnosis not present

## 2017-11-30 DIAGNOSIS — G894 Chronic pain syndrome: Secondary | ICD-10-CM | POA: Diagnosis not present

## 2017-12-01 ENCOUNTER — Emergency Department (HOSPITAL_COMMUNITY)
Admission: EM | Admit: 2017-12-01 | Discharge: 2017-12-01 | Disposition: A | Discharge status: Home / Self Care | Payer: BLUE CROSS/BLUE SHIELD | Attending: Emergency Medicine | Admitting: Emergency Medicine

## 2017-12-01 ENCOUNTER — Encounter (HOSPITAL_COMMUNITY): Payer: Self-pay

## 2017-12-01 ENCOUNTER — Other Ambulatory Visit: Payer: Self-pay

## 2017-12-01 DIAGNOSIS — Z87891 Personal history of nicotine dependence: Secondary | ICD-10-CM | POA: Insufficient documentation

## 2017-12-01 DIAGNOSIS — R5383 Other fatigue: Secondary | ICD-10-CM | POA: Insufficient documentation

## 2017-12-01 DIAGNOSIS — Z79899 Other long term (current) drug therapy: Secondary | ICD-10-CM | POA: Insufficient documentation

## 2017-12-01 DIAGNOSIS — I10 Essential (primary) hypertension: Secondary | ICD-10-CM | POA: Insufficient documentation

## 2017-12-01 DIAGNOSIS — R531 Weakness: Secondary | ICD-10-CM | POA: Diagnosis not present

## 2017-12-01 LAB — URINALYSIS, ROUTINE W REFLEX MICROSCOPIC
Bacteria, UA: NONE SEEN
Bilirubin Urine: NEGATIVE
Glucose, UA: NEGATIVE mg/dL
Ketones, ur: NEGATIVE mg/dL
Leukocytes, UA: NEGATIVE
NITRITE: NEGATIVE
PH: 5 (ref 5.0–8.0)
Protein, ur: NEGATIVE mg/dL
SPECIFIC GRAVITY, URINE: 1.02 (ref 1.005–1.030)

## 2017-12-01 LAB — TSH: TSH: 1.536 u[IU]/mL (ref 0.350–4.500)

## 2017-12-01 LAB — T4, FREE: FREE T4: 0.69 ng/dL — AB (ref 0.82–1.77)

## 2017-12-01 LAB — BASIC METABOLIC PANEL
Anion gap: 8 (ref 5–15)
BUN: 21 mg/dL — AB (ref 6–20)
CALCIUM: 8.8 mg/dL — AB (ref 8.9–10.3)
CO2: 27 mmol/L (ref 22–32)
CREATININE: 0.58 mg/dL (ref 0.44–1.00)
Chloride: 104 mmol/L (ref 98–111)
GFR calc Af Amer: >60 mL/min (ref >60)
Glucose, Bld: 89 mg/dL (ref 70–99)
Potassium: 4.1 mmol/L (ref 3.5–5.1)
Sodium: 139 mmol/L (ref 135–145)

## 2017-12-01 LAB — CBC
HCT: 42.6 % (ref 36.0–46.0)
Hemoglobin: 14.1 g/dL (ref 12.0–15.0)
MCH: 29 pg (ref 26.0–34.0)
MCHC: 33.1 g/dL (ref 30.0–36.0)
MCV: 87.7 fL (ref 78.0–100.0)
PLATELETS: 289 10*3/uL (ref 150–400)
RBC: 4.86 MIL/uL (ref 3.87–5.11)
RDW: 13.8 % (ref 11.5–15.5)
WBC: 7.7 10*3/uL (ref 4.0–10.5)

## 2017-12-01 LAB — I-STAT TROPONIN, ED: TROPONIN I, POC: 0 ng/mL (ref 0.00–0.08)

## 2017-12-01 LAB — CBG MONITORING, ED: Glucose-Capillary: 77 mg/dL (ref 70–99)

## 2017-12-01 MED ORDER — IBUPROFEN 800 MG PO TABS
800.0000 mg | ORAL_TABLET | Freq: Once | ORAL | Status: AC
  Administered 2017-12-01: 800 mg via ORAL
  Filled 2017-12-01: qty 1

## 2017-12-01 NOTE — ED Notes (Signed)
Pt and family updated on plan of care  

## 2017-12-01 NOTE — ED Notes (Signed)
Pt reports rt knee pain. Pt reports that she always has pain but its worse right now. PA made aware

## 2017-12-01 NOTE — ED Triage Notes (Signed)
Patient c/o  Weakness since yesterday. Patient stated, "I have never felt like this before. I just can't do anything."

## 2017-12-01 NOTE — ED Provider Notes (Addendum)
[facility]-EMERGENCY DEPT Provider Note  CSN: [id number] Arrival date & time: 12/01/17  1231  History   Chief Complaint Chief Complaint  Patient presents with  . Weakness   HPI Vanessa [name] is a 57 y.o. female with a medical history of Hepatitis C, HTN, syphilis, anxiety and depression who presented to the ED for fatigue x1 day. Patient reports feeling very tired yesterday and states that she slept most of the day yesterday. Denies any physical associated symptoms. Denies fever, chest pain, SOB, abdominal pain, myalgias, upper respiratory symptoms, N/V, changes in bowel or urinary habits. Denies signs of blood loss, including vaginal bleeding, hematochezia/melena, epistaxis, hematemesis or other bleeding or bruising. Patient denies recent travel or new exposures. 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 She denies any new stressors, but states that she works frequently.  Additional history obtained by medical chart. Patient has documented anxiety and depression. It appears that there was worsening of these symptoms in late 2018 secondary to chronic pain.  Past Medical History:  Diagnosis Date  . Anxiety   . Chronic knee pain   . Depression   . Gall stone   . Hepatitis C   . Hypertension   . Syphilis     Patient Active Problem List   Diagnosis Date Noted  . Allergic rhinitis 07/22/2017  . Tear of medial meniscus of right knee, current 03/03/2017  . Tear of lateral meniscus of right knee, current 03/03/2017  . Vitamin D deficiency 12/04/2016  . Baker's cyst of knee, right 05/21/2016  . Edema 12/03/2015  . Liver fibrosis 07/25/2015  . Corneal ulcers and infections 02/01/2015  . Uveitis 02/01/2015  . S/P laparoscopic cholecystectomy 11/25/2014  . Knee pain, right 07/17/2014  . Physical exam 07/17/2014  . Pap smear for cervical cancer screening 07/17/2014  . Wheezing 04/06/2014  . Other and unspecified hyperlipidemia  01/12/2014  . Chronic hepatitis C (HCC) 04/27/2013  . 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 Hypertension   . Depression     Past Surgical History:  Procedure Laterality Date  . CARPAL TUNNEL RELEASE Left   . CHOLECYSTECTOMY N/A 11/24/2014   Procedure: LAPAROSCOPIC CHOLECYSTECTOMY WITH INTRAOPERATIVE CHOLANGIOGRAM;  Surgeon: Gaynelle Adu, MD;  Location: [facility] ORS;  Service: General;  Laterality: N/A;  . ECTOPIC PREGNANCY SURGERY       OB History   None      Home Medications    Prior to Admission medications   Medication Sig Start Date End Date Taking? Authorizing Provider  ALPRAZolam Prudy Feeler) 0.5 MG tablet Take 1 tablet (0.5 mg total) by mouth 2 (two) times daily as needed for anxiety. 03/24/17  Yes Sheliah Hatch, MD  benazepril-hydrochlorthiazide (LOTENSIN HCT) 10-12.5 MG tablet TAKE 1 TABLET BY MOUTH EVERY DAY 11/22/17  Yes Sheliah Hatch, MD  ibuprofen (ADVIL,MOTRIN) 800 MG tablet Take 1 tablet (800 mg total) by mouth 3 (three) times daily as needed. 07/12/17  Yes Sheliah Hatch, MD  Multiple Vitamin (MULTIVITAMIN WITH MINERALS) TABS tablet Take 1 tablet by mouth daily.   Yes [provider]  SUBOXONE 8-2 MG FILM Place 0.5 strips under the tongue 2 (two) times daily. 04/18/13  Yes [provider]  venlafaxine XR (EFFEXOR-XR) 150 MG 24 hr capsule TAKE 1 CAPSULE (150 MG TOTAL) BY MOUTH DAILY WITH BREAKFAST. 11/22/17  Yes Sheliah Hatch, MD  venlafaxine XR (EFFEXOR-XR) 75 MG 24 hr capsule TAKE 1 CAPSULE DAILY WITH BREAKFAST. TAKE IN COMBINATION  WITH 150MG  FOR A TOTAL OF 225MG  DAILY 11/18/17  Yes Sheliah Hatch, MD  amoxicillin-clavulanate (AUGMENTIN) 875-125 MG tablet Take 1 tablet by mouth 2 (two) times daily. Patient not taking: Reported on 12/01/2017 08/31/17   Waldon Merl, PA-C  fluticasone [facility]) 50 MCG/ACT nasal spray SPRAY 2 SPRAYS INTO EACH NOSTRIL EVERY DAY Patient not taking: Reported on 12/01/2017 09/27/17   Waldon Merl,  PA-C  furosemide (LASIX) 40 MG tablet Take 1 tablet (40 mg total) by mouth daily. Patient not taking: Reported on 07/27/2017 05/21/16   Sheliah Hatch, MD  meloxicam (MOBIC) 7.5 MG tablet Take 1 tablet (7.5 mg total) by mouth daily. Patient not taking: Reported on 12/01/2017 11/26/16   Dietrich Pates, PA-C    Family History Family History  Problem Relation Age of Onset  . Hypertension Mother   . Diabetes Mother   . Drug abuse Brother   . Esophageal cancer Brother   . Colon cancer Brother   . Hypertension Sister        x's 2  . Diabetes Sister        oldest sister  . Stomach cancer Neg Hx     Social History Social History   Tobacco Use  . Smoking status: Former Smoker    Packs/day: 1.00    Years: 35.00    Pack years: 35.00    Types: E-cigarettes    Start date: 05/25/1994    Last attempt to quit: 03/03/2016    Years since quitting: 1.7  . Smokeless tobacco: Never Used  Substance Use Topics  . Alcohol use: No    Alcohol/week: 0.0 oz    Comment: "last alcohol was in the 1990's"  . Drug use: Not Currently    Types: Marijuana, "Crack" cocaine    Comment: "last drug use was in the 1990's; never used heroin"     Allergies   Patient has no known allergies.   Review of Systems Review of Systems  Constitutional: Positive for fatigue. Negative for activity change, appetite change, chills, diaphoresis and fever.  HENT: Negative.   Eyes: Negative.   Respiratory: Negative for chest tightness, shortness of breath and wheezing.   Cardiovascular: Negative for chest pain and palpitations.  Gastrointestinal: Negative for abdominal pain, constipation, diarrhea, nausea and vomiting.  Endocrine: Negative for cold intolerance and heat intolerance.  Genitourinary: Negative for decreased urine volume, difficulty urinating, dysuria, frequency and hematuria.  Musculoskeletal: Positive for arthralgias. Negative for back pain and gait problem.       Chronic knee pain  Skin: Negative.     Neurological: Negative for dizziness, facial asymmetry, weakness, light-headedness, numbness and headaches.  Hematological: Negative.   Psychiatric/Behavioral: Negative for confusion and decreased concentration.     Physical Exam Updated Vital Signs BP 119/75   Pulse 73   Temp 97.7 F (36.5 C) (Oral)   Resp 18   Ht 5\' 1"  (1.549 m)   Wt 84.8 kg (187 lb)   SpO2 93%   BMI 35.33 kg/m   Physical Exam  Constitutional: Vital signs are normal. She appears well-developed and well-nourished. She is cooperative. She does not appear ill. No distress.  HENT:  Head: Normocephalic and atraumatic.  Right Ear: Tympanic membrane, external ear and ear canal normal.  Left Ear: Tympanic membrane, external ear and ear canal normal.  Mouth/Throat: Uvula is midline, oropharynx is clear and moist and mucous membranes are normal.  Eyes: Pupils are equal, round, and reactive to light. Conjunctivae, EOM and lids are normal.  Neck: Normal range of motion and full passive range of motion without pain. Neck supple. Normal carotid pulses present. Carotid bruit is not present. No thyroid mass and no thyromegaly present.  Cardiovascular: Normal rate, regular rhythm, normal heart sounds, intact distal pulses and normal pulses.  Pulses:      Dorsalis pedis pulses are 2+ on the right side, and 2+ on the left side.       Posterior tibial pulses are 2+ on the right side, and 2+ on the left side.  Pulmonary/Chest: Effort normal and breath sounds normal.  Abdominal: Soft. Normal appearance and bowel sounds are normal. There is no tenderness.  Musculoskeletal:  5/5 strength in upper and lower extremities bilaterally.  Neurological: She is alert. She has normal strength and normal reflexes. No cranial nerve deficit or sensory deficit. She exhibits normal muscle tone.  Skin: Skin is warm and intact. Capillary refill takes less than 2 seconds.  Psychiatric: She has a normal mood and affect. Her speech is normal and  behavior is normal. Thought content normal. Cognition and memory are normal.  Nursing note and vitals reviewed.    ED Treatments / Results  Labs (all labs ordered are listed, but only abnormal results are displayed) Labs Reviewed  BASIC METABOLIC PANEL - Abnormal; Notable for the following components:      Result Value   BUN 21 (*)    Calcium 8.8 (*)    All other components within normal limits  URINALYSIS, ROUTINE W REFLEX MICROSCOPIC - Abnormal; Notable for the following components:   Hgb urine dipstick SMALL (*)    All other components within normal limits  CBC  TSH  T4, FREE  CBG MONITORING, ED  I-STAT TROPONIN, ED    EKG EKG Interpretation  Date/Time:  Wednesday December 01 2017 12:47:16 EDT Ventricular Rate:  82 PR Interval:    QRS Duration: 86 QT Interval:  362 QTC Calculation: 423 R Axis:   71 Text Interpretation:  Sinus rhythm Low voltage, precordial leads No acute changes Nonspecific ST and T wave abnormality Confirmed by Derwood Kaplan [id number]) on 12/01/2017 2:09:34 PM   Radiology No results found.  Procedures Procedures (including critical care time)  Medications Ordered in ED Medications  ibuprofen (ADVIL,MOTRIN) tablet 800 mg (800 mg Oral Given 12/01/17 1435)     Initial Impression / Assessment and Plan / ED Course  Triage vital signs and the nursing notes have been reviewed.  Pertinent labs & imaging results that were available during care of the patient were reviewed and considered in medical decision making (see chart for details).  Clinical Course as of Dec 01 1521  Wed Dec 01, 2017  1442 EKG showed NSR. No ST elevations/depressions or signs of acute ischemia or infarct. This is reassuring in combination with negative troponin which assists in evaluating and ruling out an acute cardiac process. Remaining labs are normal. No metabolic or infectious etiology to fatigue identified.   [GM]    Clinical Course User Index [GM] Genna Casimir, Sharyon Medicus, PA-C    Patient presents in no acute distress and is well appearing. Patient's primary complaint is fatigue which is difficult to assess because of the patient's unremarkable history and physical exam findings. She denies history of endocrine issues and past TSH labs drawn her PCP have all been WNL. Patient later admits that she is very unhappy with her job and with the long commute to work. She also states that she is in the process of buying a home. These stressors  are the most likely contributing factors to this fatigue as no organic causes have been identified today.  Final Clinical Impressions(s) / ED Diagnoses  1. Fatigue. Most likely due to stress. Education provided on stress relief techniques and lifestyle modifications to assist with this. Advised to follow-up with PCP if fatigue persists and she begins to having other physical complaints.  Dispo: Home. After thorough clinical evaluation, this patient is determined to be medically stable and can be safely discharged with the previously mentioned treatment and/or outpatient follow-up/referral(s). At this time, there are no other apparent medical conditions that require further screening, evaluation or treatment.   Final diagnoses:  Fatigue, unspecified type    ED Discharge Orders    None        Windy CarinaMortis, Belynda Pagaduan I, PA-C 12/01/17 [address]1523    Bocephus Cali, QuitaqueGabrielle I, PA-C 12/01/17 1524    Derwood KaplanNanavati, Ankit, MD 12/01/17 1719

## 2017-12-01 NOTE — Discharge Instructions (Addendum)
Your blood work and EKG were normal today which is great!  You may follow-up with your PCP if you continue to have fatigue along with other physical symptoms.  Good luck with your housing closing! I am very excited for you! I also hope that things work out with your job.

## 2017-12-10 ENCOUNTER — Ambulatory Visit (INDEPENDENT_AMBULATORY_CARE_PROVIDER_SITE_OTHER): Payer: BLUE CROSS/BLUE SHIELD | Admitting: Family Medicine

## 2017-12-10 ENCOUNTER — Other Ambulatory Visit: Payer: Self-pay

## 2017-12-10 ENCOUNTER — Encounter: Payer: Self-pay | Admitting: Family Medicine

## 2017-12-10 VITALS — BP 121/83 | HR 94 | Temp 98.0°F | Resp 16 | Ht 61.0 in | Wt 173.1 lb

## 2017-12-10 DIAGNOSIS — Z0000 Encounter for general adult medical examination without abnormal findings: Secondary | ICD-10-CM | POA: Diagnosis not present

## 2017-12-10 DIAGNOSIS — I10 Essential (primary) hypertension: Secondary | ICD-10-CM

## 2017-12-10 DIAGNOSIS — S83241D Other tear of medial meniscus, current injury, right knee, subsequent encounter: Secondary | ICD-10-CM

## 2017-12-10 DIAGNOSIS — F331 Major depressive disorder, recurrent, moderate: Secondary | ICD-10-CM | POA: Diagnosis not present

## 2017-12-10 DIAGNOSIS — Z1231 Encounter for screening mammogram for malignant neoplasm of breast: Secondary | ICD-10-CM | POA: Diagnosis not present

## 2017-12-10 DIAGNOSIS — E559 Vitamin D deficiency, unspecified: Secondary | ICD-10-CM

## 2017-12-10 LAB — CBC WITH DIFFERENTIAL/PLATELET
Basophils Absolute: 0.1 10*3/uL (ref 0.0–0.1)
Basophils Relative: 0.7 % (ref 0.0–3.0)
Eosinophils Absolute: 0 10*3/uL (ref 0.0–0.7)
Eosinophils Relative: 0.2 % (ref 0.0–5.0)
HCT: 41.6 % (ref 36.0–46.0)
Hemoglobin: 13.8 g/dL (ref 12.0–15.0)
LYMPHS ABS: 2.3 10*3/uL (ref 0.7–4.0)
Lymphocytes Relative: 23.9 % (ref 12.0–46.0)
MCHC: 33.1 g/dL (ref 30.0–36.0)
MCV: 87.6 fl (ref 78.0–100.0)
MONO ABS: 0.4 10*3/uL (ref 0.1–1.0)
MONOS PCT: 4.4 % (ref 3.0–12.0)
NEUTROS PCT: 70.8 % (ref 43.0–77.0)
Neutro Abs: 6.8 10*3/uL (ref 1.4–7.7)
Platelets: 305 10*3/uL (ref 150.0–400.0)
RBC: 4.74 Mil/uL (ref 3.87–5.11)
RDW: 14.2 % (ref 11.5–15.5)
WBC: 9.6 10*3/uL (ref 4.0–10.5)

## 2017-12-10 LAB — BASIC METABOLIC PANEL
BUN: 15 mg/dL (ref 6–23)
CHLORIDE: 100 meq/L (ref 96–112)
CO2: 30 meq/L (ref 19–32)
Calcium: 9.4 mg/dL (ref 8.4–10.5)
Creatinine, Ser: 0.71 mg/dL (ref 0.40–1.20)
GFR: 109.36 mL/min (ref >60.00)
GLUCOSE: 100 mg/dL — AB (ref 70–99)
POTASSIUM: 4.1 meq/L (ref 3.5–5.1)
SODIUM: 139 meq/L (ref 135–145)

## 2017-12-10 LAB — HEPATIC FUNCTION PANEL
ALBUMIN: 4.5 g/dL (ref 3.5–5.2)
ALK PHOS: 67 U/L (ref 39–117)
ALT: 22 U/L (ref 0–35)
AST: 20 U/L (ref 0–37)
Bilirubin, Direct: 0.1 mg/dL (ref 0.0–0.3)
TOTAL PROTEIN: 7.9 g/dL (ref 6.0–8.3)
Total Bilirubin: 0.3 mg/dL (ref 0.2–1.2)

## 2017-12-10 LAB — LIPID PANEL
CHOLESTEROL: 205 mg/dL — AB (ref 0–200)
HDL: 49.5 mg/dL (ref >39.00)
LDL Cholesterol: 137 mg/dL — ABNORMAL HIGH (ref 0–99)
NonHDL: 155.5
TRIGLYCERIDES: 91 mg/dL (ref 0.0–149.0)
Total CHOL/HDL Ratio: 4
VLDL: 18.2 mg/dL (ref 0.0–40.0)

## 2017-12-10 LAB — VITAMIN D 25 HYDROXY (VIT D DEFICIENCY, FRACTURES): VITD: 19.32 ng/mL — ABNORMAL LOW (ref 30.00–100.00)

## 2017-12-10 LAB — TSH: TSH: 1.53 u[IU]/mL (ref 0.35–4.50)

## 2017-12-10 MED ORDER — FLUOXETINE HCL 40 MG PO CAPS: 40.0000 mg | ORAL_CAPSULE | Freq: Every day | ORAL | 1 refills | Status: DC

## 2017-12-10 NOTE — Assessment & Plan Note (Signed)
Pt has hx of this.  Check labs and replete prn. 

## 2017-12-10 NOTE — Progress Notes (Signed)
   Subjective:    Patient ID: Vanessa [name], female    DOB: 21-Apr-1961, 57 y.o.   MRN: [id number]  HPI CPE- UTD on colonoscopy.  Due for mammo.  Due for pap- pt declines.  UTD on immunizations.   Review of Systems Patient reports no vision/ hearing changes, adenopathy,fever, weight change,  persistant/recurrent hoarseness , swallowing issues, chest pain, palpitations, edema, persistant/recurrent cough, hemoptysis, dyspnea (rest/exertional/paroxysmal nocturnal), gastrointestinal bleeding (melena, rectal bleeding), abdominal pain, significant heartburn, bowel changes, GU symptoms (dysuria, hematuria, incontinence), Gyn symptoms (abnormal  bleeding, pain),  syncope, focal weakness, memory loss, skin/hair/nail changes, abnormal bruising or bleeding.   + Depression- pt has been on Effexor 'for a long time' and feels that this is no longer effective.  Was previously on Prozac and did 'very well'.  + numbness of hands bilaterally due to arthritis and both legs    Objective:   Physical Exam General Appearance:    Alert, cooperative, no distress, appears stated age, obese  Head:    Normocephalic, without obvious abnormality, atraumatic  Eyes:    PERRL, conjunctiva/corneas clear, EOM's intact, fundi    benign, both eyes  Ears:    Normal TM's and external ear canals, both ears  Nose:   Nares normal, septum midline, mucosa normal, no drainage    or sinus tenderness  Throat:   Lips, mucosa, and tongue normal; teeth and gums normal  Neck:   Supple, symmetrical, trachea midline, no adenopathy;    Thyroid: no enlargement/tenderness/nodules  Back:     Symmetric, no curvature, ROM normal, no CVA tenderness  Lungs:     Clear to auscultation bilaterally, respirations unlabored  Chest Wall:    No tenderness or deformity   Heart:    Regular rate and rhythm, S1 and S2 normal, no murmur, rub   or gallop  Breast Exam:    Deferred to mammo  Abdomen:     Soft, non-tender, bowel sounds active all four  quadrants,    no masses, no organomegaly  Genitalia:    Deferred at pt's request  Rectal:    Extremities:   Extremities normal, atraumatic, no cyanosis or edema  Pulses:   2+ and symmetric all extremities  Skin:   Skin color, texture, turgor normal, no rashes or lesions  Lymph nodes:   Cervical, supraclavicular, and axillary nodes normal  Neurologic:   CNII-XII intact, normal strength, sensation and reflexes    throughout          Assessment & Plan:

## 2017-12-10 NOTE — Assessment & Plan Note (Signed)
Chronic problem.  Well controlled today.  Check labs.  No anticipated med changes.  Will follow. 

## 2017-12-10 NOTE — Patient Instructions (Signed)
Follow up in 6 months to recheck BP and cholesterol We'll notify you of your lab results and make any changes if needed We'll call you with your mammogram appt and your ortho appt Continue to work on healthy diet and exercise as you are able Decrease to the Effexor 150mg  daily x2 weeks and then switch to the Prozac (Fluoxetine) once daily Call with any questions or concerns CONGRATS ON YOUR NEW HOUSE!!!

## 2017-12-10 NOTE — Assessment & Plan Note (Signed)
Ongoing issue for pt.  She is asking for a 2nd opinion.  Referral placed.

## 2017-12-10 NOTE — Assessment & Plan Note (Signed)
Pt's PE unchanged from previous.  UTD on colonoscopy, due for mammo- order entered.  Pt due for pap but declines today.  UTD on immunizations.  Check labs.  Anticipatory guidance provided.

## 2017-12-10 NOTE — Assessment & Plan Note (Signed)
Deteriorated.  Pt feels that high dose Effexor is no longer effective- she has been on this for years.  Decrease Effexor to 150mg  x2 weeks and then switch to Prozac 40mg  daily.  Pt expressed understanding and is in agreement w/ plan.

## 2017-12-13 ENCOUNTER — Other Ambulatory Visit: Payer: Self-pay | Admitting: General Practice

## 2017-12-13 DIAGNOSIS — Z716 Tobacco abuse counseling: Secondary | ICD-10-CM | POA: Diagnosis not present

## 2017-12-13 DIAGNOSIS — G894 Chronic pain syndrome: Secondary | ICD-10-CM | POA: Diagnosis not present

## 2017-12-13 DIAGNOSIS — F1120 Opioid dependence, uncomplicated: Secondary | ICD-10-CM | POA: Diagnosis not present

## 2017-12-13 DIAGNOSIS — F411 Generalized anxiety disorder: Secondary | ICD-10-CM | POA: Diagnosis not present

## 2017-12-13 MED ORDER — ALPRAZOLAM 0.5 MG PO TABS: 0.5000 mg | ORAL_TABLET | Freq: Two times a day (BID) | ORAL | 0 refills | Status: DC | PRN

## 2017-12-13 MED ORDER — VITAMIN D (ERGOCALCIFEROL) 1.25 MG (50000 UNIT) PO CAPS: 50000.0000 [IU] | ORAL_CAPSULE | ORAL | 0 refills | Status: DC

## 2017-12-13 NOTE — Telephone Encounter (Signed)
Last OV 12/10/17 Alprazolam last filled 03/24/17 #60 with 1

## 2017-12-21 DIAGNOSIS — S83241A Other tear of medial meniscus, current injury, right knee, initial encounter: Secondary | ICD-10-CM | POA: Diagnosis not present

## 2017-12-21 DIAGNOSIS — S83282A Other tear of lateral meniscus, current injury, left knee, initial encounter: Secondary | ICD-10-CM | POA: Diagnosis not present

## 2017-12-21 DIAGNOSIS — M1711 Unilateral primary osteoarthritis, right knee: Secondary | ICD-10-CM | POA: Diagnosis not present

## 2017-12-21 DIAGNOSIS — M1712 Unilateral primary osteoarthritis, left knee: Secondary | ICD-10-CM | POA: Diagnosis not present

## 2017-12-21 DIAGNOSIS — S83242A Other tear of medial meniscus, current injury, left knee, initial encounter: Secondary | ICD-10-CM | POA: Diagnosis not present

## 2017-12-21 DIAGNOSIS — S83281A Other tear of lateral meniscus, current injury, right knee, initial encounter: Secondary | ICD-10-CM | POA: Diagnosis not present

## 2018-01-11 ENCOUNTER — Encounter: Payer: Self-pay | Admitting: Physician Assistant

## 2018-01-11 ENCOUNTER — Other Ambulatory Visit: Payer: Self-pay

## 2018-01-11 ENCOUNTER — Ambulatory Visit (INDEPENDENT_AMBULATORY_CARE_PROVIDER_SITE_OTHER): Payer: BLUE CROSS/BLUE SHIELD | Admitting: Physician Assistant

## 2018-01-11 VITALS — BP 102/72 | HR 68 | Temp 97.7°F | Resp 16 | Ht 61.0 in | Wt 177.6 lb

## 2018-01-11 DIAGNOSIS — B9789 Other viral agents as the cause of diseases classified elsewhere: Secondary | ICD-10-CM | POA: Diagnosis not present

## 2018-01-11 DIAGNOSIS — Z716 Tobacco abuse counseling: Secondary | ICD-10-CM | POA: Diagnosis not present

## 2018-01-11 DIAGNOSIS — G894 Chronic pain syndrome: Secondary | ICD-10-CM | POA: Diagnosis not present

## 2018-01-11 DIAGNOSIS — J069 Acute upper respiratory infection, unspecified: Secondary | ICD-10-CM | POA: Diagnosis not present

## 2018-01-11 DIAGNOSIS — F411 Generalized anxiety disorder: Secondary | ICD-10-CM | POA: Diagnosis not present

## 2018-01-11 DIAGNOSIS — F1120 Opioid dependence, uncomplicated: Secondary | ICD-10-CM | POA: Diagnosis not present

## 2018-01-11 MED ORDER — FLUTICASONE PROPIONATE 50 MCG/ACT NA SUSP: 2.0000 | Freq: Every day | NASAL | 6 refills | Status: DC

## 2018-01-11 NOTE — Progress Notes (Signed)
Patient presents to clinic today c/o 1.5 days of sore throat, sinus pressure, and cough that is mostly dry. Cough was present yesterday only. Denies tooth pain, facial pain. Noted some L-sided ear pain. Denies fever. Denies recent travel. Denies sick contact. Has taken Ibuprofen for the headache. .   Past Medical History:  Diagnosis Date  . Anxiety   . Chronic knee pain   . Depression   . Gall stone   . Hepatitis C   . Hypertension   . Syphilis     Current Outpatient Medications on File Prior to Visit  Medication Sig Dispense Refill  . ALPRAZolam (XANAX) 0.5 MG tablet Take 1 tablet (0.5 mg total) by mouth 2 (two) times daily as needed for anxiety. 60 tablet 0  . benazepril-hydrochlorthiazide (LOTENSIN HCT) 10-12.5 MG tablet TAKE 1 TABLET BY MOUTH EVERY DAY 90 tablet 0  . FLUoxetine (PROZAC) 40 MG capsule Take 1 capsule (40 mg total) by mouth daily. 90 capsule 1  . ibuprofen (ADVIL,MOTRIN) 800 MG tablet Take 1 tablet (800 mg total) by mouth 3 (three) times daily as needed. 30 tablet 5  . Multiple Vitamin (MULTIVITAMIN WITH MINERALS) TABS tablet Take 1 tablet by mouth daily.    . SUBOXONE 8-2 MG FILM Place 0.5 strips under the tongue 2 (two) times daily.    . Vitamin D, Ergocalciferol, (DRISDOL) 50000 units CAPS capsule Take 1 capsule (50,000 Units total) by mouth every 7 (seven) days. 12 capsule 0   No current facility-administered medications on file prior to visit.     No Known Allergies  Family History  Problem Relation Age of Onset  . Hypertension Mother   . Diabetes Mother   . Drug abuse Brother   . Esophageal cancer Brother   . Colon cancer Brother   . Hypertension Sister        x's 2  . Diabetes Sister        oldest sister  . Stomach cancer Neg Hx     Social History   Socioeconomic History  . Marital status: Single    Spouse name: Not on file  . Number of children: 1  . Years of education: Not on file  . Highest education level: Not on file  Occupational  History  . Occupation: Dialysis Tech  Social Needs  . Financial resource strain: Not on file  . Food insecurity:    Worry: Not on file    Inability: Not on file  . Transportation needs:    Medical: Not on file    Non-medical: Not on file  Tobacco Use  . Smoking status: Former Smoker    Packs/day: 1.00    Years: 35.00    Pack years: 35.00    Types: E-cigarettes    Start date: 05/25/1994    Last attempt to quit: 03/03/2016    Years since quitting: 1.8  . Smokeless tobacco: Never Used  Substance and Sexual Activity  . Alcohol use: No    Alcohol/week: 0.0 standard drinks    Comment: "last alcohol was in the 1990's"  . Drug use: Not Currently    Types: Marijuana, "Crack" cocaine    Comment: "last drug use was in the 1990's; never used heroin"  . Sexual activity: Not Currently  Lifestyle  . Physical activity:    Days per week: Not on file    Minutes per session: Not on file  . Stress: Not on file  Relationships  . Social connections:    Talks on phone:  Not on file    Gets together: Not on file    Attends religious service: Not on file    Active member of club or organization: Not on file    Attends meetings of clubs or organizations: Not on file    Relationship status: Not on file  Other Topics Concern  . Not on file  Social History Narrative   Works at [employer] in [employer]. Lives with daughter.   Review of Systems - See HPI.  All other ROS are negative.  BP 102/72   Pulse 68   Temp 97.7 F (36.5 C) (Oral)   Resp 16   Ht '5\' 1"'  (1.549 m)   Wt 177 lb 9.6 oz (80.6 kg)   SpO2 95%   BMI 33.56 kg/m   Physical Exam  Constitutional: She is oriented to person, place, and time. She appears well-developed and well-nourished.  HENT:  Head: Normocephalic and atraumatic.  Right Ear: Tympanic membrane normal. No middle ear effusion.  Left Ear: Tympanic membrane normal.  No middle ear effusion.  Mouth/Throat: Uvula is midline, oropharynx is clear and moist and mucous  membranes are normal. No oral lesions. No oropharyngeal exudate, posterior oropharyngeal edema or posterior oropharyngeal erythema.  Eyes: Pupils are equal, round, and reactive to light.  Neck: Neck supple.  Cardiovascular: Normal rate, regular rhythm and normal heart sounds.  Pulmonary/Chest: Effort normal and breath sounds normal.  Neurological: She is alert and oriented to person, place, and time.  Vitals reviewed.   Recent Results (from the past 2160 hour(s))  CBG monitoring, ED     Status: None   Collection Time: 12/01/17  1:15 PM  Result Value Ref Range   Glucose-Capillary 77 70 - 99 mg/dL  Basic metabolic panel     Status: Abnormal   Collection Time: 12/01/17  1:17 PM  Result Value Ref Range   Sodium 139 135 - 145 mmol/L   Potassium 4.1 3.5 - 5.1 mmol/L   Chloride 104 98 - 111 mmol/L    Comment: Please note change in reference range.   CO2 27 22 - 32 mmol/L   Glucose, Bld 89 70 - 99 mg/dL    Comment: Please note change in reference range.   BUN 21 (H) 6 - 20 mg/dL    Comment: Please note change in reference range.   Creatinine, Ser 0.58 0.44 - 1.00 mg/dL   Calcium 8.8 (L) 8.9 - 10.3 mg/dL   GFR calc non Af Amer >60 >60 mL/min   GFR calc Af Amer >60 >60 mL/min    Comment: (NOTE) The eGFR has been calculated using the CKD EPI equation. This calculation has not been validated in all clinical situations. eGFR's persistently <60 mL/min signify possible Chronic Kidney Disease.    Anion gap 8 5 - 15    Comment: Performed at [facility], [place] [address], [place], [place] [address]  CBC     Status: None   Collection Time: 12/01/17  1:17 PM  Result Value Ref Range   WBC 7.7 4.0 - 10.5 K/uL   RBC 4.86 3.87 - 5.11 MIL/uL   Hemoglobin 14.1 12.0 - 15.0 g/dL   HCT 42.6 36.0 - 46.0 %   MCV 87.7 78.0 - 100.0 fL   MCH 29.0 26.0 - 34.0 pg   MCHC 33.1 30.0 - 36.0 g/dL   RDW 13.8 11.5 - 15.5 %   Platelets 289 150 - 400 K/uL    Comment: Performed at [facility], [place] [address], [place], [place] [address]  Urinalysis, Routine w reflex microscopic     Status: Abnormal   Collection Time: 12/01/17  1:17 PM  Result Value Ref Range   Color, Urine YELLOW YELLOW   APPearance CLEAR CLEAR   Specific Gravity, Urine 1.020 1.005 - 1.030   pH 5.0 5.0 - 8.0   Glucose, UA NEGATIVE NEGATIVE mg/dL   Hgb urine dipstick SMALL (A) NEGATIVE   Bilirubin Urine NEGATIVE NEGATIVE   Ketones, ur NEGATIVE NEGATIVE mg/dL   Protein, ur NEGATIVE NEGATIVE mg/dL   Nitrite NEGATIVE NEGATIVE   Leukocytes, UA NEGATIVE NEGATIVE   RBC / HPF 0-5 0 - 5 RBC/hpf   WBC, UA 0-5 0 - 5 WBC/hpf   Bacteria, UA NONE SEEN NONE SEEN   Squamous Epithelial / LPF 6-10 0 - 5   Mucus PRESENT     Comment: Performed at [facility], [place] [address], [place], [place] [address]  I-Stat Troponin, ED (not at [facility])     Status: None   Collection Time: 12/01/17  1:23 PM  Result Value Ref Range   Troponin i, poc 0.00 0.00 - 0.08 ng/mL   Comment 3            Comment: Due to the release kinetics of cTnI, a negative result within the first hours of the onset of symptoms does not rule out myocardial infarction with certainty. If myocardial infarction is still suspected, repeat the test at appropriate intervals.   TSH     Status: None   Collection Time: 12/01/17  2:27 PM  Result Value Ref Range   TSH 1.536 0.350 - 4.500 uIU/mL    Comment: Performed by a 3rd Generation assay with a functional sensitivity of <=0.01 uIU/mL. Performed at [facility], [place] [address], [place], [place] [address]   T4, free     Status: Abnormal   Collection Time: 12/01/17  2:27 PM  Result Value Ref Range   Free T4 0.69 (L) 0.82 - 1.77 ng/dL    Comment: (NOTE) Biotin ingestion may interfere with free T4 tests. If the results are inconsistent with the TSH level, previous test results, or the clinical presentation, then consider biotin interference. If needed, order  repeat testing after stopping biotin. Performed at [facility], [place] [address] [place],  [address]   Lipid panel     Status: Abnormal   Collection Time: 12/10/17  1:40 PM  Result Value Ref Range   Cholesterol 205 (H) 0 - 200 mg/dL    Comment: ATP III Classification       Desirable:  < 200 mg/dL               Borderline High:  200 - 239 mg/dL          High:  > = 240 mg/dL   Triglycerides 91.0 0.0 - 149.0 mg/dL    Comment: Normal:  <150 mg/dLBorderline High:  150 - 199 mg/dL   HDL 49.50 >39.00 mg/dL   VLDL 18.2 0.0 - 40.0 mg/dL   LDL Cholesterol 137 (H) 0 - 99 mg/dL   Total CHOL/HDL Ratio 4     Comment:                Men          Women1/2 Average Risk     3.4          3.3Average Risk  5.0          4.42X Average Risk          9.6          7.13X Average Risk          15.0          11.0                       NonHDL 155.50     Comment: NOTE:  Non-HDL goal should be 30 mg/dL higher than patient's LDL goal (i.e. LDL goal of < 70 mg/dL, would have non-HDL goal of < 100 mg/dL)  Basic metabolic panel     Status: Abnormal   Collection Time: 12/10/17  1:40 PM  Result Value Ref Range   Sodium 139 135 - 145 mEq/L   Potassium 4.1 3.5 - 5.1 mEq/L   Chloride 100 96 - 112 mEq/L   CO2 30 19 - 32 mEq/L   Glucose, Bld 100 (H) 70 - 99 mg/dL   BUN 15 6 - 23 mg/dL   Creatinine, Ser 0.71 0.40 - 1.20 mg/dL   Calcium 9.4 8.4 - 10.5 mg/dL   GFR 109.36 >60.00 mL/min  TSH     Status: None   Collection Time: 12/10/17  1:40 PM  Result Value Ref Range   TSH 1.53 0.35 - 4.50 uIU/mL  Hepatic function panel     Status: None   Collection Time: 12/10/17  1:40 PM  Result Value Ref Range   Total Bilirubin 0.3 0.2 - 1.2 mg/dL   Bilirubin, Direct 0.1 0.0 - 0.3 mg/dL   Alkaline Phosphatase 67 39 - 117 U/L   AST 20 0 - 37 U/L   ALT 22 0 - 35 U/L   Total Protein 7.9 6.0 - 8.3 g/dL   Albumin 4.5 3.5 - 5.2 g/dL  CBC with Differential/Platelet     Status: None   Collection Time: 12/10/17  1:40 PM   Result Value Ref Range   WBC 9.6 4.0 - 10.5 K/uL   RBC 4.74 3.87 - 5.11 Mil/uL   Hemoglobin 13.8 12.0 - 15.0 g/dL   HCT 41.6 36.0 - 46.0 %   MCV 87.6 78.0 - 100.0 fl   MCHC 33.1 30.0 - 36.0 g/dL   RDW 14.2 11.5 - 15.5 %   Platelets 305.0 150.0 - 400.0 K/uL   Neutrophils Relative % 70.8 43.0 - 77.0 %   Lymphocytes Relative 23.9 12.0 - 46.0 %   Monocytes Relative 4.4 3.0 - 12.0 %   Eosinophils Relative 0.2 0.0 - 5.0 %   Basophils Relative 0.7 0.0 - 3.0 %   Neutro Abs 6.8 1.4 - 7.7 K/uL   Lymphs Abs 2.3 0.7 - 4.0 K/uL   Monocytes Absolute 0.4 0.1 - 1.0 K/uL   Eosinophils Absolute 0.0 0.0 - 0.7 K/uL   Basophils Absolute 0.1 0.0 - 0.1 K/uL  VITAMIN D 25 Hydroxy (Vit-D Deficiency, Fractures)     Status: Abnormal   Collection Time: 12/10/17  1:40 PM  Result Value Ref Range   VITD 19.32 (L) 30.00 - 100.00 ng/mL   Assessment/Plan: 1. Viral URI with cough 1.5 days of symptoms. Afebrile. No alarm symptoms. Exam unremarkable. Supportive measures and OTC medications reviewed. Rx Flonase. Follow-up if not improving.  - fluticasone (FLONASE) 50 MCG/ACT nasal spray; Place 2 sprays into both nostrils daily.  Dispense: 16 g; Refill: [place], [place]

## 2018-01-11 NOTE — Patient Instructions (Signed)
Please stay well-hydrated and get plenty of rest.  Alternate tylenol and ibuprofen for headache or sore throat. Start saline nasal rinse. Use the Flonase as directed.  Symptoms should calm down in a few days but can take 7-10 days to resolve.   If not or if things are worsening, please give me a call.

## 2018-01-18 DIAGNOSIS — F411 Generalized anxiety disorder: Secondary | ICD-10-CM | POA: Diagnosis not present

## 2018-01-18 DIAGNOSIS — F1120 Opioid dependence, uncomplicated: Secondary | ICD-10-CM | POA: Diagnosis not present

## 2018-01-18 DIAGNOSIS — F11220 Opioid dependence with intoxication, uncomplicated: Secondary | ICD-10-CM | POA: Diagnosis not present

## 2018-01-18 DIAGNOSIS — Z716 Tobacco abuse counseling: Secondary | ICD-10-CM | POA: Diagnosis not present

## 2018-01-18 DIAGNOSIS — Z79899 Other long term (current) drug therapy: Secondary | ICD-10-CM | POA: Diagnosis not present

## 2018-01-18 DIAGNOSIS — G8929 Other chronic pain: Secondary | ICD-10-CM | POA: Diagnosis not present

## 2018-01-18 DIAGNOSIS — G894 Chronic pain syndrome: Secondary | ICD-10-CM | POA: Diagnosis not present

## 2018-02-01 ENCOUNTER — Ambulatory Visit
Admission: RE | Admit: 2018-02-01 | Discharge: 2018-02-01 | Disposition: A | Discharge status: Home / Self Care | Payer: BLUE CROSS/BLUE SHIELD | Source: Ambulatory Visit | Attending: Family Medicine | Admitting: Family Medicine

## 2018-02-01 DIAGNOSIS — Z1231 Encounter for screening mammogram for malignant neoplasm of breast: Secondary | ICD-10-CM | POA: Diagnosis not present

## 2018-02-07 DIAGNOSIS — F1120 Opioid dependence, uncomplicated: Secondary | ICD-10-CM | POA: Diagnosis not present

## 2018-02-07 DIAGNOSIS — G894 Chronic pain syndrome: Secondary | ICD-10-CM | POA: Diagnosis not present

## 2018-02-07 DIAGNOSIS — F411 Generalized anxiety disorder: Secondary | ICD-10-CM | POA: Diagnosis not present

## 2018-02-07 DIAGNOSIS — F11220 Opioid dependence with intoxication, uncomplicated: Secondary | ICD-10-CM | POA: Diagnosis not present

## 2018-02-12 ENCOUNTER — Other Ambulatory Visit: Payer: Self-pay | Admitting: Physician Assistant

## 2018-02-13 ENCOUNTER — Other Ambulatory Visit: Payer: Self-pay | Admitting: Family Medicine

## 2018-02-14 NOTE — Telephone Encounter (Signed)
Last Xanax rx filled on 12/13/17 #60 Last office visit 12/10/17 CPE Please advise

## 2018-02-20 ENCOUNTER — Other Ambulatory Visit: Payer: Self-pay | Admitting: Family Medicine

## 2018-02-21 MED ORDER — BENAZEPRIL-HYDROCHLOROTHIAZIDE 10-12.5 MG PO TABS: 1.0000 | ORAL_TABLET | Freq: Every day | ORAL | 0 refills | Status: DC

## 2018-02-21 NOTE — Addendum Note (Signed)
Addended by: Jenine Krisher L on: 02/21/2018 08:49 AM   Modules accepted: Orders  

## 2018-03-01 DIAGNOSIS — F1120 Opioid dependence, uncomplicated: Secondary | ICD-10-CM | POA: Diagnosis not present

## 2018-03-01 DIAGNOSIS — Z79899 Other long term (current) drug therapy: Secondary | ICD-10-CM | POA: Diagnosis not present

## 2018-03-01 DIAGNOSIS — F411 Generalized anxiety disorder: Secondary | ICD-10-CM | POA: Diagnosis not present

## 2018-03-01 DIAGNOSIS — G894 Chronic pain syndrome: Secondary | ICD-10-CM | POA: Diagnosis not present

## 2018-03-01 DIAGNOSIS — Z716 Tobacco abuse counseling: Secondary | ICD-10-CM | POA: Diagnosis not present

## 2018-03-02 ENCOUNTER — Other Ambulatory Visit: Payer: Self-pay | Admitting: Family Medicine

## 2018-03-08 DIAGNOSIS — H17822 Peripheral opacity of cornea, left eye: Secondary | ICD-10-CM | POA: Diagnosis not present

## 2018-03-08 DIAGNOSIS — H16041 Marginal corneal ulcer, right eye: Secondary | ICD-10-CM | POA: Diagnosis not present

## 2018-03-10 DIAGNOSIS — B0052 Herpesviral keratitis: Secondary | ICD-10-CM | POA: Diagnosis not present

## 2018-03-10 DIAGNOSIS — H17822 Peripheral opacity of cornea, left eye: Secondary | ICD-10-CM | POA: Diagnosis not present

## 2018-03-21 ENCOUNTER — Other Ambulatory Visit: Payer: Self-pay | Admitting: Family Medicine

## 2018-03-21 NOTE — Telephone Encounter (Signed)
Last OV 01/11/18 Ibuprofen last filled 07/12/17 #30 with 5

## 2018-03-24 DIAGNOSIS — H17822 Peripheral opacity of cornea, left eye: Secondary | ICD-10-CM | POA: Diagnosis not present

## 2018-03-24 DIAGNOSIS — H16142 Punctate keratitis, left eye: Secondary | ICD-10-CM | POA: Diagnosis not present

## 2018-03-24 DIAGNOSIS — B0052 Herpesviral keratitis: Secondary | ICD-10-CM | POA: Diagnosis not present

## 2018-03-29 ENCOUNTER — Other Ambulatory Visit (HOSPITAL_COMMUNITY)
Admission: RE | Admit: 2018-03-29 | Discharge: 2018-03-29 | Disposition: A | Discharge status: Home / Self Care | Payer: BLUE CROSS/BLUE SHIELD | Source: Ambulatory Visit | Attending: Physician Assistant | Admitting: Physician Assistant

## 2018-03-29 ENCOUNTER — Other Ambulatory Visit: Payer: Self-pay

## 2018-03-29 ENCOUNTER — Encounter: Payer: Self-pay | Admitting: Physician Assistant

## 2018-03-29 ENCOUNTER — Ambulatory Visit: Payer: BLUE CROSS/BLUE SHIELD | Admitting: Physician Assistant

## 2018-03-29 VITALS — BP 120/80 | HR 73 | Temp 97.9°F | Resp 14 | Ht 61.0 in | Wt 170.0 lb

## 2018-03-29 DIAGNOSIS — G894 Chronic pain syndrome: Secondary | ICD-10-CM | POA: Diagnosis not present

## 2018-03-29 DIAGNOSIS — M150 Primary generalized (osteo)arthritis: Secondary | ICD-10-CM | POA: Diagnosis not present

## 2018-03-29 DIAGNOSIS — F1120 Opioid dependence, uncomplicated: Secondary | ICD-10-CM | POA: Diagnosis not present

## 2018-03-29 DIAGNOSIS — F411 Generalized anxiety disorder: Secondary | ICD-10-CM | POA: Diagnosis not present

## 2018-03-29 DIAGNOSIS — Z202 Contact with and (suspected) exposure to infections with a predominantly sexual mode of transmission: Secondary | ICD-10-CM | POA: Diagnosis not present

## 2018-03-29 NOTE — Progress Notes (Signed)
Patient presents to clinic today c/o concerns of potential exposure to STI. Notes that she has not been sexually active in many years. Recently had intercourse with a long-time female friend. Notes that there was some mild dyspareunia during initiation of intercourse but otherwise was fine. States her partner called her today and noted he was having painful discharge from his penis. States he is adamant that she has given him something. She denies symptoms herself.   Past Medical History:  Diagnosis Date  . Anxiety   . Chronic knee pain   . Depression   . Gall stone   . Hepatitis C   . Hypertension   . Syphilis     Current Outpatient Medications on File Prior to Visit  Medication Sig Dispense Refill  . ALPRAZolam (XANAX) 0.5 MG tablet TAKE 1 TABLET BY MOUTH TWICE A DAY AS NEEDED FOR ANXIETY 60 tablet 0  . benazepril-hydrochlorthiazide (LOTENSIN HCT) 10-12.5 MG tablet Take 1 tablet by mouth daily. 90 tablet 0  . FLUoxetine (PROZAC) 40 MG capsule Take 1 capsule (40 mg total) by mouth daily. 90 capsule 1  . fluticasone (FLONASE) 50 MCG/ACT nasal spray Place 2 sprays into both nostrils daily. 16 g 6  . ibuprofen (ADVIL,MOTRIN) 800 MG tablet TAKE 1 TABLET BY MOUTH 3 TIMES DAILY AS NEEDED. 30 tablet 5  . Multiple Vitamin (MULTIVITAMIN WITH MINERALS) TABS tablet Take 1 tablet by mouth daily.    . SUBOXONE 8-2 MG FILM Place 0.5 strips under the tongue 2 (two) times daily.    Marland Kitchen venlafaxine XR (EFFEXOR-XR) 150 MG 24 hr capsule TAKE 1 CAPSULE (150 MG TOTAL) BY MOUTH DAILY WITH BREAKFAST. 90 capsule 0   No current facility-administered medications on file prior to visit.     No Known Allergies  Family History  Problem Relation Age of Onset  . Hypertension Mother   . Diabetes Mother   . Drug abuse Brother   . Esophageal cancer Brother   . Colon cancer Brother   . Hypertension Sister        x's 2  . Diabetes Sister        oldest sister  . Stomach cancer Neg Hx   . Breast cancer Neg Hx       Social History   Socioeconomic History  . Marital status: Single    Spouse name: Not on file  . Number of children: 1  . Years of education: Not on file  . Highest education level: Not on file  Occupational History  . Occupation: Dialysis Tech  Social Needs  . Financial resource strain: Not on file  . Food insecurity:    Worry: Not on file    Inability: Not on file  . Transportation needs:    Medical: Not on file    Non-medical: Not on file  Tobacco Use  . Smoking status: Former Smoker    Packs/day: 1.00    Years: 35.00    Pack years: 35.00    Types: E-cigarettes    Start date: 05/25/1994    Last attempt to quit: 03/03/2016    Years since quitting: 2.0  . Smokeless tobacco: Never Used  Substance and Sexual Activity  . Alcohol use: No    Alcohol/week: 0.0 standard drinks    Comment: "last alcohol was in the 1990's"  . Drug use: Not Currently    Types: Marijuana, "Crack" cocaine    Comment: "last drug use was in the 1990's; never used heroin"  . Sexual activity: Not  Currently  Lifestyle  . Physical activity:    Days per week: Not on file    Minutes per session: Not on file  . Stress: Not on file  Relationships  . Social connections:    Talks on phone: Not on file    Gets together: Not on file    Attends religious service: Not on file    Active member of club or organization: Not on file    Attends meetings of clubs or organizations: Not on file    Relationship status: Not on file  Other Topics Concern  . Not on file  Social History Narrative   Works at [employer] in [employer]. Lives with daughter.   Review of Systems - See HPI.  All other ROS are negative.  BP 120/80   Pulse 73   Temp 97.9 F (36.6 C) (Oral)   Resp 14   Ht 5\' 1"  (1.549 m)   Wt 170 lb (77.1 kg)   SpO2 97%   BMI 32.12 kg/m   Physical Exam  Constitutional: She appears well-developed and well-nourished.  HENT:  Head: Normocephalic and atraumatic.  Eyes: Conjunctivae are normal.   Neck: Neck supple.  Cardiovascular: Normal rate, regular rhythm, normal heart sounds and intact distal pulses.  Pulmonary/Chest: Effort normal and breath sounds normal. No stridor. No respiratory distress. She has no wheezes. She has no rales. She exhibits no tenderness.  Abdominal: Soft. Bowel sounds are normal. She exhibits no distension. There is no tenderness.  Genitourinary: There is no rash, tenderness or lesion on the right labia. There is no rash, tenderness or lesion on the left labia. Cervix exhibits no motion tenderness, no discharge and no friability. Right adnexum displays no mass and no tenderness. Left adnexum displays no mass and no tenderness. No bleeding in the vagina. Vaginal discharge (scant amount of clear discharge, potentially physiologic.) found.  Genitourinary Comments: Chaperone present for examination  Vitals reviewed.  Assessment/Plan: 1. Potential exposure to STD Exam with mild clear discharge. No tenderness or other abnormal finding on examination. Wet prep obtained. Will check full STI panel today. She has been encouraged to refrain from sexual activity until results are in. She is encouraged to tell her partner to get tested as he is symptomatic. Discussed that she could potentially have had something for a while without knowing but it is highly unlikely. Safe sex practices reviewed.  - Acute Hep Panel & Hep B Surface Ab - HSV(herpes smplx)abs-1+2(IgG+IgM)-bld - HIV Antibody (routine testing w rflx) - Cervicovaginal ancillary only - RPR   Piedad Climes, PA-C

## 2018-03-29 NOTE — Patient Instructions (Signed)
Examination is good today. I have sent in a wet prep for vaginal testing.  Please go to the lab today for blood work.  I will call you with your results. We will alter treatment regimen(s) if indicated by your results.   Refrain from any sexual activity until results are in.  Always limit sexual partners and wear condoms consistently.

## 2018-03-30 ENCOUNTER — Telehealth: Payer: Self-pay | Admitting: Emergency Medicine

## 2018-03-30 NOTE — Telephone Encounter (Signed)
The only result we have so far is her RPR (Syphilis screen) which is negative.  Will call once other results are in.

## 2018-03-30 NOTE — Telephone Encounter (Signed)
Called patient and informed her. She noted understanding.

## 2018-03-30 NOTE — Telephone Encounter (Signed)
Pt requesting to speak with Blair Heys nurse to discuss results further. Please advise.

## 2018-03-30 NOTE — Telephone Encounter (Signed)
Copied from CRM [id number]. Topic: General - Other >> Mar 30, 2018  9:13 AM Tamela Oddi wrote: Reason for CRM: Patient called to speak with the nurse or doctor regarding her test results from her appointment with the doctor.  Please advise.  CB# [phone]

## 2018-03-31 LAB — CERVICOVAGINAL ANCILLARY ONLY: Wet Prep (BD Affirm): POSITIVE — AB

## 2018-03-31 LAB — HSV(HERPES SMPLX)ABS-I+II(IGG+IGM)-BLD
HSV 1 Glycoprotein G Ab, IgG: 41.2 index — ABNORMAL HIGH (ref 0.00–0.90)
HSV 2 IgG, Type Spec: 6.7 index — ABNORMAL HIGH (ref 0.00–0.90)
HSVI/II Comb IgM: <0.91 Ratio (ref 0.00–0.90)

## 2018-03-31 NOTE — Telephone Encounter (Signed)
Results are not all in as discussed at time of phone call yesterday.   So far we have negative HIV, RPR, Hep A,B screen. 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 Not recently obtained.  Hep C Ab is positive but she had a history of treated Hep C. Waiting on Qualitative test to make sure this is still taken care of (should not be an issue).   Still waiting on vaginal results.

## 2018-03-31 NOTE — Telephone Encounter (Signed)
Pt. Given results and verbalizes understanding. 

## 2018-04-01 ENCOUNTER — Other Ambulatory Visit: Payer: Self-pay | Admitting: Physician Assistant

## 2018-04-01 DIAGNOSIS — Z202 Contact with and (suspected) exposure to infections with a predominantly sexual mode of transmission: Secondary | ICD-10-CM

## 2018-04-01 DIAGNOSIS — B379 Candidiasis, unspecified: Secondary | ICD-10-CM

## 2018-04-01 LAB — HCV RNA,QUANTITATIVE REAL TIME PCR
HCV Quantitative Log: <1.18 Log IU/mL
HCV RNA, PCR, QN: NOT DETECTED [IU]/mL

## 2018-04-01 LAB — ACUTE HEP PANEL AND HEP B SURFACE AB
HEP B S AG: NONREACTIVE
HEPATITIS C ANTIBODY REFILL: REACTIVE — AB
Hep A IgM: NONREACTIVE
Hep B C IgM: NONREACTIVE
SIGNAL TO CUT-OFF: 34 — AB (ref <1.00)

## 2018-04-01 LAB — HIV ANTIBODY (ROUTINE TESTING W REFLEX): HIV 1&2 Ab, 4th Generation: NONREACTIVE

## 2018-04-01 LAB — RPR: RPR Ser Ql: NONREACTIVE

## 2018-04-01 LAB — REFLEX TIQ

## 2018-04-01 MED ORDER — FLUCONAZOLE 150 MG PO TABS: 150.0000 mg | ORAL_TABLET | Freq: Once | ORAL | 0 refills | Status: AC

## 2018-04-04 ENCOUNTER — Other Ambulatory Visit: Payer: BLUE CROSS/BLUE SHIELD

## 2018-04-06 DIAGNOSIS — F411 Generalized anxiety disorder: Secondary | ICD-10-CM | POA: Diagnosis not present

## 2018-04-06 DIAGNOSIS — F1120 Opioid dependence, uncomplicated: Secondary | ICD-10-CM | POA: Diagnosis not present

## 2018-04-06 DIAGNOSIS — G894 Chronic pain syndrome: Secondary | ICD-10-CM | POA: Diagnosis not present

## 2018-04-06 DIAGNOSIS — Z79899 Other long term (current) drug therapy: Secondary | ICD-10-CM | POA: Diagnosis not present

## 2018-04-06 DIAGNOSIS — Z716 Tobacco abuse counseling: Secondary | ICD-10-CM | POA: Diagnosis not present

## 2018-04-12 ENCOUNTER — Other Ambulatory Visit: Payer: Self-pay | Admitting: Family Medicine

## 2018-04-13 NOTE — Telephone Encounter (Signed)
Last OV 03/29/18 Alprazolam last filled 02/14/18 #60 with 0

## 2018-04-20 ENCOUNTER — Other Ambulatory Visit: Payer: Self-pay | Admitting: Family Medicine

## 2018-05-03 ENCOUNTER — Other Ambulatory Visit: Payer: Self-pay | Admitting: Family Medicine

## 2018-05-10 DIAGNOSIS — F1120 Opioid dependence, uncomplicated: Secondary | ICD-10-CM | POA: Diagnosis not present

## 2018-05-10 DIAGNOSIS — Z716 Tobacco abuse counseling: Secondary | ICD-10-CM | POA: Diagnosis not present

## 2018-05-10 DIAGNOSIS — F411 Generalized anxiety disorder: Secondary | ICD-10-CM | POA: Diagnosis not present

## 2018-05-10 DIAGNOSIS — G894 Chronic pain syndrome: Secondary | ICD-10-CM | POA: Diagnosis not present

## 2018-05-19 ENCOUNTER — Other Ambulatory Visit: Payer: Self-pay | Admitting: Family Medicine

## 2018-06-09 ENCOUNTER — Other Ambulatory Visit: Payer: Self-pay | Admitting: Family Medicine

## 2018-06-10 ENCOUNTER — Other Ambulatory Visit: Payer: Self-pay | Admitting: Family Medicine

## 2018-06-10 NOTE — Telephone Encounter (Signed)
90 day Rx sent in by Dr. Beverely Low on 05/03/18. Patient needs to check with pharmacy.

## 2018-06-10 NOTE — Telephone Encounter (Signed)
Please advise? Per note from July pt was decreased to lower dose effexor and then was going to change to prozac.

## 2018-06-10 NOTE — Telephone Encounter (Signed)
Pt states that she is on both of these prescriptions. That she needs the 75 mg.

## 2018-06-10 NOTE — Telephone Encounter (Signed)
Patient is calling and would like to know if Dr. Mardelle Matte can refill this since Dr. Beverely Low is out of the office until Wednesday. She states she is needing to leave town and be with her mother that lives over an hour away. Please advise.   CB# [phone]

## 2018-06-13 NOTE — Telephone Encounter (Signed)
Called pt and LMOVM to return call.   Need to know exactly which medications she is taking.   Ok for [facility] to Discuss results / PCP recommendations / Schedule patient.

## 2018-06-13 NOTE — Telephone Encounter (Signed)
Patient states she is taking both the 150 mg and the 75 mg together every day- that is why she is needed the refill. Patient states it is working very well for her. Patient is completely out please review for refill.

## 2018-06-13 NOTE — Telephone Encounter (Signed)
Verify that she is not taking her Fluoxetine that is on her list. If not, then ok to refill the 75 mg Effexor XR.

## 2018-06-13 NOTE — Telephone Encounter (Signed)
Pt calling to check status  °

## 2018-06-13 NOTE — Telephone Encounter (Signed)
Called pt line was busy. Will try again later.

## 2018-06-22 ENCOUNTER — Ambulatory Visit: Payer: BLUE CROSS/BLUE SHIELD | Admitting: Family Medicine

## 2018-06-29 ENCOUNTER — Other Ambulatory Visit: Payer: Self-pay | Admitting: Family Medicine

## 2018-06-30 NOTE — Telephone Encounter (Signed)
Last refill: 04/13/18 #60, 0 Last OV: 12/10/17

## 2018-07-05 DIAGNOSIS — G894 Chronic pain syndrome: Secondary | ICD-10-CM | POA: Diagnosis not present

## 2018-07-07 DIAGNOSIS — M1712 Unilateral primary osteoarthritis, left knee: Secondary | ICD-10-CM | POA: Diagnosis not present

## 2018-07-07 DIAGNOSIS — M1711 Unilateral primary osteoarthritis, right knee: Secondary | ICD-10-CM | POA: Diagnosis not present

## 2018-07-11 DIAGNOSIS — G894 Chronic pain syndrome: Secondary | ICD-10-CM | POA: Diagnosis not present

## 2018-07-12 ENCOUNTER — Ambulatory Visit (INDEPENDENT_AMBULATORY_CARE_PROVIDER_SITE_OTHER): Payer: Commercial Managed Care - PPO | Admitting: Family Medicine

## 2018-07-12 ENCOUNTER — Encounter: Payer: Self-pay | Admitting: Family Medicine

## 2018-07-12 ENCOUNTER — Other Ambulatory Visit: Payer: Self-pay

## 2018-07-12 ENCOUNTER — Other Ambulatory Visit (HOSPITAL_COMMUNITY)
Admission: RE | Admit: 2018-07-12 | Discharge: 2018-07-12 | Disposition: A | Discharge status: Home / Self Care | Payer: Commercial Managed Care - PPO | Source: Ambulatory Visit | Attending: Family Medicine | Admitting: Family Medicine

## 2018-07-12 VITALS — BP 121/82 | HR 79 | Temp 98.2°F | Resp 16 | Ht 61.0 in | Wt 180.2 lb

## 2018-07-12 DIAGNOSIS — Z124 Encounter for screening for malignant neoplasm of cervix: Secondary | ICD-10-CM | POA: Insufficient documentation

## 2018-07-12 DIAGNOSIS — I10 Essential (primary) hypertension: Secondary | ICD-10-CM

## 2018-07-12 DIAGNOSIS — E785 Hyperlipidemia, unspecified: Secondary | ICD-10-CM | POA: Diagnosis not present

## 2018-07-12 DIAGNOSIS — Z1211 Encounter for screening for malignant neoplasm of colon: Secondary | ICD-10-CM

## 2018-07-12 DIAGNOSIS — E669 Obesity, unspecified: Secondary | ICD-10-CM | POA: Diagnosis not present

## 2018-07-12 LAB — CBC WITH DIFFERENTIAL/PLATELET
BASOS ABS: 0.1 10*3/uL (ref 0.0–0.1)
Basophils Relative: 0.6 % (ref 0.0–3.0)
EOS ABS: 0.1 10*3/uL (ref 0.0–0.7)
Eosinophils Relative: 0.5 % (ref 0.0–5.0)
HCT: 41.8 % (ref 36.0–46.0)
Hemoglobin: 13.7 g/dL (ref 12.0–15.0)
LYMPHS ABS: 3 10*3/uL (ref 0.7–4.0)
LYMPHS PCT: 26 % (ref 12.0–46.0)
MCHC: 32.8 g/dL (ref 30.0–36.0)
MCV: 87.5 fl (ref 78.0–100.0)
Monocytes Absolute: 0.6 10*3/uL (ref 0.1–1.0)
Monocytes Relative: 4.8 % (ref 3.0–12.0)
NEUTROS ABS: 7.9 10*3/uL — AB (ref 1.4–7.7)
NEUTROS PCT: 68.1 % (ref 43.0–77.0)
PLATELETS: 307 10*3/uL (ref 150.0–400.0)
RBC: 4.78 Mil/uL (ref 3.87–5.11)
RDW: 14.3 % (ref 11.5–15.5)
WBC: 11.7 10*3/uL — ABNORMAL HIGH (ref 4.0–10.5)

## 2018-07-12 LAB — LIPID PANEL
CHOL/HDL RATIO: 3
Cholesterol: 172 mg/dL (ref 0–200)
HDL: 55.4 mg/dL (ref >39.00)
LDL CALC: 91 mg/dL (ref 0–99)
NonHDL: 116.8
Triglycerides: 127 mg/dL (ref 0.0–149.0)
VLDL: 25.4 mg/dL (ref 0.0–40.0)

## 2018-07-12 LAB — TSH: TSH: 1.67 u[IU]/mL (ref 0.35–4.50)

## 2018-07-12 LAB — BASIC METABOLIC PANEL
BUN: 17 mg/dL (ref 6–23)
CALCIUM: 9.2 mg/dL (ref 8.4–10.5)
CO2: 31 mEq/L (ref 19–32)
CREATININE: 0.7 mg/dL (ref 0.40–1.20)
Chloride: 97 mEq/L (ref 96–112)
GFR: 104.37 mL/min (ref >60.00)
Glucose, Bld: 78 mg/dL (ref 70–99)
Potassium: 4.3 mEq/L (ref 3.5–5.1)
SODIUM: 136 meq/L (ref 135–145)

## 2018-07-12 LAB — HEPATIC FUNCTION PANEL
ALK PHOS: 61 U/L (ref 39–117)
ALT: 21 U/L (ref 0–35)
AST: 17 U/L (ref 0–37)
Albumin: 4.3 g/dL (ref 3.5–5.2)
BILIRUBIN DIRECT: 0.1 mg/dL (ref 0.0–0.3)
TOTAL PROTEIN: 7.6 g/dL (ref 6.0–8.3)
Total Bilirubin: 0.3 mg/dL (ref 0.2–1.2)

## 2018-07-12 NOTE — Assessment & Plan Note (Signed)
Chronic problem.  Attempting to control w/ diet and exercise but admits to doing neither.  Check labs and determine if meds are needed

## 2018-07-12 NOTE — Assessment & Plan Note (Signed)
Deteriorated.  Pt has gained 10 lbs since last visit.  Stressed need for healthy diet and regular exercise.  Check labs to risk stratify.  Will follow. 

## 2018-07-12 NOTE — Assessment & Plan Note (Signed)
Pap collected. 

## 2018-07-12 NOTE — Assessment & Plan Note (Signed)
Chronic problem.  Well controlled.  Asymptomatic w/ exception of non-cardiac CP.  Check labs.  No anticipated med changes.  Will follow.

## 2018-07-12 NOTE — Progress Notes (Signed)
   Subjective:    Patient ID: Vanessa [name], female    DOB: 03-Feb-1961, 58 y.o.   MRN: [id number]  HPI HTN- chronic problem, on Benazepril HCTZ 10/12.5mg  daily w/ good control. No SOB, HAs, visual changes, edema.  Random, 'sharp' chest pain not associated w/ exertion.   Hyperlipidemia- pt has hx of elevated LDL.  Attempting to control w/ diet/exercise but pt has gained 10 lbs.  No abd pain, N/V  Obesity- pt has gained 10 lbs since last visit and BMI is now 34.06.  No regular exercise, not following a particular diet  Pap- pt is due for pap.  No vaginal concerns  Colonoscopy- recall is due  Review of Systems For ROS see HPI     Objective:   Physical Exam Vitals signs reviewed. Exam conducted with a chaperone present.  Constitutional:      General: She is not in acute distress.    Appearance: She is well-developed. She is obese.  HENT:     Head: Normocephalic and atraumatic.  Eyes:     Conjunctiva/sclera: Conjunctivae normal.     Pupils: Pupils are equal, round, and reactive to light.  Neck:     Musculoskeletal: Normal range of motion and neck supple.     Thyroid: No thyromegaly.  Cardiovascular:     Rate and Rhythm: Normal rate and regular rhythm.     Heart sounds: Normal heart sounds. No murmur.  Pulmonary:     Effort: Pulmonary effort is normal. No respiratory distress.     Breath sounds: Normal breath sounds.  Abdominal:     General: There is no distension.     Palpations: Abdomen is soft.     Tenderness: There is no abdominal tenderness.  Genitourinary:    General: Normal vulva.     Pubic Area: No rash.      Labia:        Right: No rash, tenderness or lesion.        Left: No rash, tenderness or lesion.      Urethra: No prolapse.     Vagina: No signs of injury and foreign body. No vaginal discharge, erythema, tenderness, bleeding or lesions.     Cervix: No cervical motion tenderness, discharge, friability, lesion, erythema or cervical bleeding.     Uterus: Normal.  Not enlarged and not tender.      Adnexa: Right adnexa normal and left adnexa normal.     Rectum: No external hemorrhoid.  Lymphadenopathy:     Cervical: No cervical adenopathy.  Skin:    General: Skin is warm and dry.  Neurological:     Mental Status: She is alert and oriented to person, place, and time.  Psychiatric:        Behavior: Behavior normal.           Assessment & Plan:

## 2018-07-12 NOTE — Patient Instructions (Addendum)
Schedule your complete physical in 6 months We'll notify you of your lab results and make any changes if needed We'll call you with your GI appt for repeat colonoscopy Continue to work on healthy diet and regular exercise- you can do it!! Call with any questions or concerns Happy [place] Ouch!!

## 2018-07-14 ENCOUNTER — Encounter: Payer: Self-pay | Admitting: General Practice

## 2018-07-14 LAB — CYTOLOGY - PAP
Adequacy: ABSENT
Diagnosis: NEGATIVE
HPV: NOT DETECTED

## 2018-07-15 ENCOUNTER — Other Ambulatory Visit: Payer: Self-pay | Admitting: General Practice

## 2018-07-15 MED ORDER — VENLAFAXINE HCL ER 75 MG PO CP24: ORAL_CAPSULE | ORAL | 1 refills | Status: DC

## 2018-08-19 ENCOUNTER — Other Ambulatory Visit: Payer: Self-pay | Admitting: Family Medicine

## 2018-08-19 MED ORDER — ALPRAZOLAM 0.5 MG PO TABS: ORAL_TABLET | ORAL | 0 refills | Status: DC

## 2018-08-19 NOTE — Telephone Encounter (Signed)
Last Filled: 06/30/2018 #60, 0 Last OV: 07/12/2018

## 2018-09-05 DIAGNOSIS — G894 Chronic pain syndrome: Secondary | ICD-10-CM | POA: Diagnosis not present

## 2018-09-26 ENCOUNTER — Telehealth: Payer: Self-pay | Admitting: General Surgery

## 2018-09-26 NOTE — Telephone Encounter (Signed)
Left a voice mail on Vanessa [name]'s mobile phone to call to pre-screen for zoom visit on 09/27/2018.

## 2018-09-27 ENCOUNTER — Encounter: Payer: Self-pay | Admitting: Gastroenterology

## 2018-09-27 ENCOUNTER — Ambulatory Visit (INDEPENDENT_AMBULATORY_CARE_PROVIDER_SITE_OTHER): Payer: Commercial Managed Care - PPO | Admitting: Gastroenterology

## 2018-09-27 ENCOUNTER — Other Ambulatory Visit: Payer: Self-pay

## 2018-09-27 VITALS — Ht 61.0 in | Wt 180.0 lb

## 2018-09-27 DIAGNOSIS — Z800 Family history of malignant neoplasm of digestive organs: Secondary | ICD-10-CM | POA: Diagnosis not present

## 2018-09-27 DIAGNOSIS — R159 Full incontinence of feces: Secondary | ICD-10-CM | POA: Diagnosis not present

## 2018-09-27 MED ORDER — PEG 3350-KCL-NA BICARB-NACL 420 G PO SOLR: 4000.0000 mL | ORAL | 0 refills | Status: DC

## 2018-09-27 NOTE — Patient Instructions (Addendum)
She knows to try Citrucel daily fiber supplement to see if this will help her fecal incontinence.   We will arrange a colonoscopy at her soonest convenience, hopefully sometime in the next 2 to 3 weeks, for fecal incontinence and family history of colon cancer. Prep-o-Pik as worked well for her in the past.  You have been scheduled for a colonoscopy. Please follow written instructions given to you at your visit today.  Please pick up your prep supplies at the pharmacy within the next 1-3 days. If you use inhalers (even only as needed), please bring them with you on the day of your procedure. Your physician has requested that you go to www.startemmi.com and enter the access code given to you at your visit today. This web site gives a general overview about your procedure. However, you should still follow specific instructions given to you by our office regarding your preparation for the procedure.

## 2018-09-27 NOTE — Progress Notes (Signed)
Review of pertinent gastrointestinal problems: 1.  Family history of colon cancer.  Her brother had colon cancer.  Colonoscopy May 2015 Dr. Christella Hartigan for chronic constipation and family history of colon cancer found diverticulosis, there was some erythema associated with the diverticulum.  Biopsies showed no obvious chronic or active inflammation.   This service was provided via virtual visit.   Only audio was used.  The patient was located at home.  I was located in my office.  The patient did consent to this virtual visit and is aware of possible charges through their insurance for this visit.  I last saw her about 5 years ago at the time of a colonoscopy.  My certified medical assistant, Barbaraann Rondo, contributed to this visit by contacting the patient by phone 1 or 2 business days prior to the appointment and also followed up on the recommendations I made after the visit.  Time spent on virtual visit: 23 min   HPI: This is a very pleasant 58 year old woman whom I last saw about 5 years ago  Her brother was diagnosed in his late 35s with colon cancer.  5 years ago she was having some intermittent fecal incontinence.  We felt a lot of her issues were related to the fact that she also had serious constipation.  She was put on Reunion and this worked well for her but in the past for 5 years she has stopped it because she does not need it anymore.  She seems to have a good BM about every 1 to 2 days.  Unfortunately she still has intermittent fecal incontinence.  She will have BM without even knowing, she will leak stool after a BM.  Can occur about once a week.  She does not see blood.  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  See those results summarized  above.  Chief complaint is fecal incontinence, family history of colon cancer  ROS: complete GI ROS as described in HPI, all other review negative.  Constitutional:  No unintentional weight loss   Past Medical History:  Diagnosis Date  . Anxiety   . Chronic knee pain   . Depression   . Gall stone   . Hepatitis C   . Hypertension   . Syphilis     Past Surgical History:  Procedure Laterality Date  . CARPAL TUNNEL RELEASE Left   . CHOLECYSTECTOMY N/A 11/24/2014   Procedure: LAPAROSCOPIC CHOLECYSTECTOMY WITH INTRAOPERATIVE CHOLANGIOGRAM;  Surgeon: Gaynelle Adu, MD;  Location: [facility];  Service: General;  Laterality: N/A;  . ECTOPIC PREGNANCY SURGERY      Current Outpatient Medications  Medication Sig Dispense Refill  . ALPRAZolam (XANAX) 0.5 MG tablet TAKE 1 TABLET BY MOUTH TWICE A DAY AS NEEDED FOR ANXIETY 60 tablet 0  . fluticasone (FLONASE) 50 MCG/ACT nasal spray Place 2 sprays into both nostrils daily. 16 g 6  . ibuprofen (ADVIL,MOTRIN) 800 MG tablet TAKE 1 TABLET BY MOUTH 3 TIMES DAILY AS NEEDED. 90 tablet 1  . Multiple Vitamin (MULTIVITAMIN WITH MINERALS) TABS tablet Take 1 tablet by mouth daily.    . SUBOXONE 8-2 MG FILM Place 0.5 strips under the tongue 2 (two) times daily.    Marland Kitchen venlafaxine XR (EFFEXOR-XR) 150  MG 24 hr capsule TAKE 1 CAPSULE (150 MG TOTAL) BY MOUTH DAILY WITH BREAKFAST. 90 capsule 0  . venlafaxine XR (EFFEXOR-XR) 75 MG 24 hr capsule TAKE 1 CAPSULE DAILY WITH BREAKFAST. TAKE IN COMBINATION WITH  FOR A TOTAL OF  DAILY 90 capsule 1   No current facility-administered medications for this visit.     Allergies as of 09/27/2018  . (No Known Allergies)    Family History  Problem Relation Age of Onset  . Hypertension Mother   . Diabetes Mother   . Drug abuse Brother   . Esophageal cancer Brother   . Colon cancer Brother   . Hypertension Sister        x's 2  . Diabetes Sister        oldest sister  . Stomach cancer Neg Hx   . Breast cancer Neg  Hx     Social History   Socioeconomic History  . Marital status: Single    Spouse name: Not on file  . Number of children: 1  . Years of education: Not on file  . Highest education level: Not on file  Occupational History  . Occupation: Dialysis Tech  Social Needs  . Financial resource strain: Not on file  . Food insecurity:    Worry: Not on file    Inability: Not on file  . Transportation needs:    Medical: Not on file    Non-medical: Not on file  Tobacco Use  . Smoking status: Former Smoker    Packs/day: 1.00    Years: 35.00    Pack years: 35.00    Types: E-cigarettes    Start date: 05/25/1994    Last attempt to quit: 03/03/2016    Years since quitting: 2.5  . Smokeless tobacco: Never Used  Substance and Sexual Activity  . Alcohol use: No    Alcohol/week: 0.0 standard drinks    Comment: "last alcohol was in the 1990's"  . Drug use: Not Currently    Types: Marijuana, "Crack" cocaine    Comment: "last drug use was in the 1990's; never used heroin"  . Sexual activity: Not Currently  Lifestyle  . Physical activity:    Days per week: Not on file    Minutes per session: Not on file  . Stress: Not on file  Relationships  . Social connections:    Talks on phone: Not on file    Gets together: Not on file    Attends religious service: Not on file    Active member of club or organization: Not on file    Attends meetings of clubs or organizations: Not on file    Relationship status: Not on file  . Intimate partner violence:    Fear of current or ex partner: Not on file    Emotionally abused: Not on file    Physically abused: Not on file    Forced sexual activity: Not on file  Other Topics Concern  . Not on file  Social History Narrative   Works at [employer] in [employer]. Lives with daughter.     Physical Exam: Unable to perform because this was a "telemed visit" due to current Covid-19 pandemic  Assessment and plan: 58 y.o. female with fecal incontinence, family  history of colon cancer  Her brother was diagnosed with colon cancer in his 65s.  Her last colonoscopy was 5 years ago.  I recommended that we repeat that for her now.  I also recommended that she try a daily  over-the-counter powder fiber supplement to see if that can help with her weekly fecal incontinence.  I am hoping bulking up her stools will keep it altogether and keep her from having leakage.  Please see the "Patient Instructions" section for addition details about the plan.  Rob Buntinganiel Vickki Igou, MD [facility] 09/27/2018, 3:16 PM

## 2018-10-03 DIAGNOSIS — G894 Chronic pain syndrome: Secondary | ICD-10-CM | POA: Diagnosis not present

## 2018-10-04 DIAGNOSIS — M1712 Unilateral primary osteoarthritis, left knee: Secondary | ICD-10-CM | POA: Diagnosis not present

## 2018-10-04 DIAGNOSIS — M1711 Unilateral primary osteoarthritis, right knee: Secondary | ICD-10-CM | POA: Diagnosis not present

## 2018-10-06 ENCOUNTER — Encounter: Payer: Self-pay | Admitting: Gastroenterology

## 2018-10-09 ENCOUNTER — Telehealth: Payer: Self-pay | Admitting: *Deleted

## 2018-10-09 NOTE — Telephone Encounter (Signed)
Covid-19 travel screening questions  Have you traveled in the last 14 days? no If yes where?  Do you now or have you had a fever in the last 14 days? no  Do you have any respiratory symptoms of shortness of breath or cough now or in the last 14 days? no  Do you have any family members or close contacts with diagnosed or suspected Covid-19? No  Pt is aware that care partner will be waiting in car during procedure.  She will wear a mask into building       

## 2018-10-10 ENCOUNTER — Telehealth: Payer: Self-pay | Admitting: Gastroenterology

## 2018-10-10 NOTE — Telephone Encounter (Signed)
Left message on machine to call back  

## 2018-10-10 NOTE — Telephone Encounter (Signed)
Pt called in wanting to know if she should still keep her sched colon for tomorrow due to the fact that one of her pt tested positive for covid 19 on Friday. She is needing a call back to advised and it is ok to leave a detailed voice message if no answer.

## 2018-10-10 NOTE — Telephone Encounter (Signed)
Dr Christella Hartigan do you want to reschedule the pt?

## 2018-10-10 NOTE — Telephone Encounter (Signed)
She has known, recent contact with a patient with Covid and so we should postpone. She needs to call her PCP to discuss if/when she should be tested herself.    Please plan to call her in 7-10 days to see how she is doing, consider rescheduling her case.

## 2018-10-10 NOTE — Telephone Encounter (Signed)
The pt was advised and has been asked to call us in 7-10 days with an update and also advised to call PCP regarding testing for Covid-19.

## 2018-10-11 ENCOUNTER — Encounter: Payer: Commercial Managed Care - PPO | Admitting: Gastroenterology

## 2018-10-11 NOTE — Telephone Encounter (Signed)
The pt has been advised she will need to call back in 10 days to update [place] on her condition and discuss rescheduling at that time.

## 2018-10-11 NOTE — Telephone Encounter (Signed)
Pt called back and wanting to sched. appt did advised of the msg that was placed in by nurse however she wanted to request a call back from the nurse

## 2018-10-16 ENCOUNTER — Other Ambulatory Visit: Payer: Self-pay | Admitting: Family Medicine

## 2018-10-18 NOTE — Telephone Encounter (Signed)
Last refill:08/19/18 #60, 0 Last OV:07/12/18

## 2018-10-25 ENCOUNTER — Encounter: Payer: Self-pay | Admitting: Gastroenterology

## 2018-11-01 ENCOUNTER — Ambulatory Visit (AMBULATORY_SURGERY_CENTER): Payer: Self-pay

## 2018-11-01 ENCOUNTER — Other Ambulatory Visit: Payer: Self-pay

## 2018-11-01 VITALS — Ht 61.0 in | Wt 179.0 lb

## 2018-11-01 DIAGNOSIS — Z800 Family history of malignant neoplasm of digestive organs: Secondary | ICD-10-CM

## 2018-11-01 MED ORDER — NA SULFATE-K SULFATE-MG SULF 17.5-3.13-1.6 GM/177ML PO SOLN: 1.0000 | Freq: Once | ORAL | 0 refills | Status: AC

## 2018-11-01 NOTE — Progress Notes (Signed)
Denies allergies to eggs or soy products. Denies complication of anesthesia or sedation. Denies use of weight loss medication. Denies use of O2.   Emmi instructions given for colonoscopy..  Pre-Visit was conducted by phone due to Covid 19. Instructions were reviewed and mailed to confirmed home address. Patient was insisting that she use Suprep because it is less to drink. Prep was changed to Suprep instead of Golytely. A 15.00 coupon was given to patient. Patient was encouraged to call if she had questions or concerns regarding instructions.

## 2018-11-04 ENCOUNTER — Encounter: Payer: Self-pay | Admitting: Gastroenterology

## 2018-11-11 ENCOUNTER — Other Ambulatory Visit: Payer: Self-pay | Admitting: Family Medicine

## 2018-11-13 ENCOUNTER — Other Ambulatory Visit: Payer: Self-pay | Admitting: Family Medicine

## 2018-11-14 ENCOUNTER — Telehealth: Payer: Self-pay | Admitting: Gastroenterology

## 2018-11-14 NOTE — Telephone Encounter (Signed)

## 2018-11-15 ENCOUNTER — Encounter: Payer: Self-pay | Admitting: Gastroenterology

## 2018-11-15 ENCOUNTER — Ambulatory Visit (AMBULATORY_SURGERY_CENTER): Payer: Commercial Managed Care - PPO | Admitting: Gastroenterology

## 2018-11-15 ENCOUNTER — Encounter: Payer: Commercial Managed Care - PPO | Admitting: Gastroenterology

## 2018-11-15 ENCOUNTER — Other Ambulatory Visit: Payer: Self-pay

## 2018-11-15 VITALS — BP 129/81 | HR 67 | Temp 98.5°F | Resp 12 | Ht 61.0 in | Wt 179.0 lb

## 2018-11-15 DIAGNOSIS — K5730 Diverticulosis of large intestine without perforation or abscess without bleeding: Secondary | ICD-10-CM

## 2018-11-15 DIAGNOSIS — Z1211 Encounter for screening for malignant neoplasm of colon: Secondary | ICD-10-CM | POA: Diagnosis not present

## 2018-11-15 DIAGNOSIS — Z800 Family history of malignant neoplasm of digestive organs: Secondary | ICD-10-CM | POA: Diagnosis present

## 2018-11-15 MED ORDER — SODIUM CHLORIDE 0.9 % IV SOLN: 500.0000 mL | Freq: Once | INTRAVENOUS | Status: DC

## 2018-11-15 NOTE — Op Note (Addendum)
Vanessa [name] Patient Name: Vanessa HakimSusie [name] Procedure Date: 11/15/2018 9:26 AM MRN: [id number] Endoscopist: Vanessa [name] , MD Age: 58 Referring MD:  Date of Birth: [birth year] Gender: Female Account #: [id number] Procedure:                Colonoscopy Indications:              Screening in patient at increased risk: Family                            history of 1st-degree relative with colorectal                            cancer before age 57 years; Her brother had colon                            cancer in his 4350s. Colonoscopy May 2015 Dr. Christella HartiganJacobs                            for chronic constipation and family history of                            colon cancer found diverticulosis, there was some                            erythema associated with the diverticulum. Biopsies                            showed no obvious chronic or active inflammation. Medicines:                Monitored Anesthesia Care Procedure:                Pre-Anesthesia Assessment:                           - Prior to the procedure, a History and Physical                            was performed, and patient medications and                            allergies were reviewed. The patient's tolerance of                            previous anesthesia was also reviewed. The risks                            and benefits of the procedure and the sedation                            options and risks were discussed with the patient.                            All questions were answered, and informed consent  was obtained. Prior Anticoagulants: The patient has                            taken no previous anticoagulant or antiplatelet                            agents. ASA Grade Assessment: II - A patient with                            mild systemic disease. After reviewing the risks                            and benefits, the patient was deemed in                            satisfactory  condition to undergo the procedure.                           After obtaining informed consent, the colonoscope                            was passed under direct vision. Throughout the                            procedure, the patient's blood pressure, pulse, and                            oxygen saturations were monitored continuously. The                            Colonoscope was introduced through the anus and                            advanced to the the cecum, identified by                            appendiceal orifice and ileocecal valve. The                            colonoscopy was performed without difficulty. The                            patient tolerated the procedure well. The quality                            of the bowel preparation was good. The ileocecal                            valve, appendiceal orifice, and rectum were                            photographed. Scope In: 9:52:48 AM Scope Out: 10:03:29 AM Scope Withdrawal Time: 0 hours 3 minutes 43 seconds  Total Procedure Duration: 0 hours 10 minutes  41 seconds  Findings:                 Multiple small and large-mouthed diverticula were                            found in the entire colon.                           The exam was otherwise without abnormality on                            direct and retroflexion views. Complications:            No immediate complications. Estimated blood loss:                            None. Estimated Blood Loss:     Estimated blood loss: none. Impression:               - Diverticulosis in the entire examined colon.                           - The examination was otherwise normal on direct                            and retroflexion views.                           - No specimens collected. Recommendation:           - Patient has a contact number available for                            emergencies. The signs and symptoms of potential                            delayed  complications were discussed with the                            patient. Return to normal activities tomorrow.                            Written discharge instructions were provided to the                            patient.                           - Resume previous diet.                           - Continue present medications.                           - Repeat colonoscopy in 5 years for screening. Milus Banister, MD 11/15/2018 10:06:08 AM This report has been signed electronically.

## 2018-11-15 NOTE — Progress Notes (Signed)
PT taken to PACU. Monitors in place. VSS. Report given to RN. 

## 2018-11-15 NOTE — Progress Notes (Signed)
Covid screening and temp done by Riki Sheer.  Vital signs done per Rica Mote

## 2018-11-15 NOTE — Progress Notes (Signed)
Pt's states no medical or surgical changes since previsit or office visit. 

## 2018-11-15 NOTE — Patient Instructions (Signed)
Discharge instructions given. Handouts on diverticulosis. Resume previous medications. YOU HAD AN ENDOSCOPIC PROCEDURE TODAY AT [facility]:   Refer to the procedure report that was given to you for any specific questions about what was found during the examination.  If the procedure report does not answer your questions, please call your gastroenterologist to clarify.  If you requested that your care partner not be given the details of your procedure findings, then the procedure report has been included in a sealed envelope for you to review at your convenience later.  YOU SHOULD EXPECT: Some feelings of bloating in the abdomen. Passage of more gas than usual.  Walking can help get rid of the air that was put into your GI tract during the procedure and reduce the bloating. If you had a lower endoscopy (such as a colonoscopy or flexible sigmoidoscopy) you may notice spotting of blood in your stool or on the toilet paper. If you underwent a bowel prep for your procedure, you may not have a normal bowel movement for a few days.  Please Note:  You might notice some irritation and congestion in your nose or some drainage.  This is from the oxygen used during your procedure.  There is no need for concern and it should clear up in a day or so.  SYMPTOMS TO REPORT IMMEDIATELY:   Following lower endoscopy (colonoscopy or flexible sigmoidoscopy):  Excessive amounts of blood in the stool  Significant tenderness or worsening of abdominal pains  Swelling of the abdomen that is new, acute  Fever of 100F or higher   For urgent or emergent issues, a gastroenterologist can be reached at any hour by calling [phone].   DIET:  We do recommend a small meal at first, but then you may proceed to your regular diet.  Drink plenty of fluids but you should avoid alcoholic beverages for 24 hours.  ACTIVITY:  You should plan to take it easy for the rest of today and you should NOT DRIVE or use  heavy machinery until tomorrow (because of the sedation medicines used during the test).    FOLLOW UP: Our staff will call the number listed on your records 48-72 hours following your procedure to check on you and address any questions or concerns that you may have regarding the information given to you following your procedure. If we do not reach you, we will leave a message.  We will attempt to reach you two times.  During this call, we will ask if you have developed any symptoms of COVID 19. If you develop any symptoms (ie: fever, flu-like symptoms, shortness of breath, cough etc.) before then, please call [phone].  If you test positive for Covid 19 in the 2 weeks post procedure, please call and report this information to [place].    If any biopsies were taken you will be contacted by phone or by letter within the next 1-3 weeks.  Please call us at [phone] if you have not heard about the biopsies in 3 weeks.    SIGNATURES/CONFIDENTIALITY: You and/or your care partner have signed paperwork which will be entered into your electronic medical record.  These signatures attest to the fact that that the information above on your After Visit Summary has been reviewed and is understood.  Full responsibility of the confidentiality of this discharge information lies with you and/or your care-partner.

## 2018-11-17 ENCOUNTER — Telehealth: Payer: Self-pay

## 2018-11-17 NOTE — Telephone Encounter (Signed)
  Follow up Call-  Call back number 11/15/2018  Post procedure Call Back phone  # [phone]  Permission to leave phone message Yes  Some recent data might be hidden     Patient questions:  Do you have a fever, pain , or abdominal swelling? No. Pain Score  0 *  Have you tolerated food without any problems? Yes.    Have you been able to return to your normal activities? Yes.    Do you have any questions about your discharge instructions: Diet   No. Medications  No. Follow up visit  No.  Do you have questions or concerns about your Care? No.  Actions: * If pain score is 4 or above: No action needed, pain <4.  1. Have you developed a fever since your procedure? no  2.   Have you had an respiratory symptoms (SOB or cough) since your procedure? no  3.   Have you tested positive for COVID 19 since your procedure no  4.   Have you had any family members/close contacts diagnosed with the COVID 19 since your procedure?  no   If yes to any of these questions please route to Joylene John, RN and Alphonsa Gin, Therapist, sports.

## 2018-12-08 ENCOUNTER — Other Ambulatory Visit: Payer: Self-pay | Admitting: Family Medicine

## 2018-12-09 NOTE — Telephone Encounter (Signed)
Last OV 07/12/18 Alprazolam last filled 10/19/18 #30 with 0

## 2018-12-12 ENCOUNTER — Telehealth: Payer: Self-pay | Admitting: Gastroenterology

## 2018-12-12 ENCOUNTER — Telehealth: Payer: Self-pay | Admitting: Family Medicine

## 2018-12-12 NOTE — Telephone Encounter (Signed)
Patient stated that she is having issues with controlling her bowel movements. She was unable to go into work today because she is having continuous accidents. Stated she wants PCP to call in medication to help stop the diarrhea. I informed patient that I would send note back to PCP and that PCP might request an VV or in office visit.  Patient was agreeable with either. Please advise.

## 2018-12-12 NOTE — Telephone Encounter (Signed)
Pt states that she has tried immodium and fiber gummies and pills. This has been going on for like 2 years Had a colonoscopy in the past and everything was normal. Has not seen GI in a couple of years- cannot remember who she previously seen. Pt states she has no control and has no idea that it is coming out. Pt could not go to work today.

## 2018-12-12 NOTE — Telephone Encounter (Signed)
Has she tried Immodium?  How long has this been going on?  Is she having diarrhea or just accidents?

## 2018-12-12 NOTE — Telephone Encounter (Signed)
Pt had colonoscopy w/ Dr Ardis Hughs last month on 6/23.  Given her complete lack of bowel control, I would encourage her to call GI

## 2018-12-12 NOTE — Telephone Encounter (Signed)
Left message on machine to call back per Dr Ardis Hughs last office note the pt was advised to start fiber.  Did she start?  If so, did it help?

## 2018-12-12 NOTE — Telephone Encounter (Signed)
Pt states that her bowels keep leaking and she does not know what to do, she would like some advise.

## 2018-12-12 NOTE — Telephone Encounter (Signed)
Called and advised pt of PCP recommendation to contact GI. Pt was given the contact info and stated an understanding.

## 2018-12-12 NOTE — Telephone Encounter (Signed)
Pt called to see if Dr. Birdie Riddle can send in a medication to help stop the diarrhea she is experiencing. Pt said she is experiencing severe diarrhea and has to stay home from work because of it. / please advise

## 2018-12-13 NOTE — Telephone Encounter (Signed)
The pt had an office visit on 5/5 and was scheduled to have a colonoscopy on 6/23.  She was advised to begin Citrucel fiber daily.  She states she did start Citrucel but has continued to have fecal leakage.  She wants to know what else she can do.  It is very frustrating for her.  She is aware that Dr Ardis Hughs is off this week and we will contact her as soon as he has reviewed.Please advise?

## 2018-12-18 NOTE — Telephone Encounter (Signed)
She needs rov with me first available.  Should stay on daily fiber supplement and also take a single imodium every morning after waking until the appt.

## 2018-12-19 NOTE — Telephone Encounter (Signed)
Left message on machine to call back  

## 2018-12-20 NOTE — Telephone Encounter (Signed)
Unable to reach the pt by phone. Message was sent via My Chart.

## 2019-01-09 ENCOUNTER — Telehealth: Payer: Self-pay | Admitting: Gastroenterology

## 2019-01-09 NOTE — Telephone Encounter (Signed)
Pt reported that she has had incontinence and requested medication for it.

## 2019-01-09 NOTE — Telephone Encounter (Signed)
lmom for patient to call back 

## 2019-01-10 NOTE — Telephone Encounter (Signed)
Returning your call from yesterday. °

## 2019-01-10 NOTE — Telephone Encounter (Signed)
lmom for patient to call back 

## 2019-01-16 ENCOUNTER — Other Ambulatory Visit: Payer: Self-pay

## 2019-01-16 ENCOUNTER — Encounter: Payer: Self-pay | Admitting: Physician Assistant

## 2019-01-16 ENCOUNTER — Ambulatory Visit (INDEPENDENT_AMBULATORY_CARE_PROVIDER_SITE_OTHER): Payer: Commercial Managed Care - PPO | Admitting: Physician Assistant

## 2019-01-16 DIAGNOSIS — Z20822 Contact with and (suspected) exposure to covid-19: Secondary | ICD-10-CM

## 2019-01-16 DIAGNOSIS — Z20828 Contact with and (suspected) exposure to other viral communicable diseases: Secondary | ICD-10-CM

## 2019-01-16 DIAGNOSIS — M791 Myalgia, unspecified site: Secondary | ICD-10-CM | POA: Diagnosis not present

## 2019-01-16 DIAGNOSIS — R5383 Other fatigue: Secondary | ICD-10-CM | POA: Diagnosis not present

## 2019-01-16 DIAGNOSIS — R51 Headache: Secondary | ICD-10-CM | POA: Diagnosis not present

## 2019-01-16 NOTE — Progress Notes (Signed)
Virtual Visit via Video   I connected with patient on 01/16/19 at 10:30 AM EDT by a video enabled telemedicine application and verified that I am speaking with the correct person using two identifiers.  Location patient: Home Location provider: [facility], [facility] Persons participating in the virtual visit: Patient, Provider, CMA (Patina Moore)  I discussed the limitations of evaluation and management by telemedicine and the availability of in person appointments. The patient expressed understanding and agreed to proceed.  Subjective:   HPI:   Patient presents via D[facility] c/o sudden onset of fatigue, headache (mostly frontal), aching in joints. Notes some sinus congestion starting today. Notes chills and nausea without vomiting. Denies diarrhea, constipation. Denies fever, sweats. Denies recent travel or sick contact. Has not taken anything for symptoms.   ROS:   See pertinent positives and negatives per HPI.  Patient Active Problem List   Diagnosis Date Noted  . Obesity (BMI 30-39.9) 07/12/2018  . Allergic rhinitis 07/22/2017  . Tear of medial meniscus of right knee, current 03/03/2017  . Tear of lateral meniscus of right knee, current 03/03/2017  . Vitamin D deficiency 12/04/2016  . Baker's cyst of knee, right 05/21/2016  . Edema 12/03/2015  . Liver fibrosis 07/25/2015  . Corneal ulcers and infections 02/01/2015  . Uveitis 02/01/2015  . S/P laparoscopic cholecystectomy 11/25/2014  . Knee pain, right 07/17/2014  . Physical exam 07/17/2014  . Pap smear for cervical cancer screening 07/17/2014  . Wheezing 04/06/2014  . Hyperlipidemia 01/12/2014  . History of hepatitis C 04/27/2013  . 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 Hypertension   . Depression     Social History   Tobacco Use  . Smoking status: Former Smoker    Packs/day: 1.00    Years: 35.00    Pack years: 35.00    Types: E-cigarettes    Start date: 05/25/1994    Quit date: 03/03/2016   Years since quitting: 2.8  . Smokeless tobacco: Never Used  Substance Use Topics  . Alcohol use: No    Alcohol/week: 0.0 standard drinks    Comment: "last alcohol was in the 1990's"    Current Outpatient Medications:  .  ALPRAZolam (XANAX) 0.5 MG tablet, TAKE 1 TABLET BY MOUTH TWICE A DAY AS NEEDED FOR ANXIETY, Disp: 60 tablet, Rfl: 0 .  benazepril-hydrochlorthiazide (LOTENSIN HCT) 10-12.5 MG tablet, TAKE 1 TABLET BY MOUTH EVERY DAY, Disp: 90 tablet, Rfl: 1 .  fluticasone (FLONASE) 50 MCG/ACT nasal spray, Place 2 sprays into both nostrils daily., Disp: 16 g, Rfl: 6 .  ibuprofen (ADVIL,MOTRIN) 800 MG tablet, TAKE 1 TABLET BY MOUTH 3 TIMES DAILY AS NEEDED., Disp: 90 tablet, Rfl: 1 .  Multiple Vitamin (MULTIVITAMIN WITH MINERALS) TABS tablet, Take 1 tablet by mouth daily., Disp: , Rfl:  .  SUBOXONE 8-2 MG FILM, Place 0.5 strips under the tongue 2 (two) times daily., Disp: , Rfl:  .  venlafaxine XR (EFFEXOR-XR) 150 MG 24 hr capsule, TAKE 1 CAPSULE (150 MG TOTAL) BY MOUTH DAILY WITH BREAKFAST., Disp: 90 capsule, Rfl: 0 .  venlafaxine XR (EFFEXOR-XR) 75 MG 24 hr capsule, TAKE 1 CAPSULE DAILY WITH BREAKFAST. TAKE IN COMBINATION WITH 150MG  FOR A TOTAL OF 225MG  DAILY, Disp: 90 capsule, Rfl: 1  No Known Allergies  Objective:   There were no vitals taken for this visit.  Patient is well-developed, well-nourished in no acute distress.  Resting comfortably at home.  Head is normocephalic, atraumatic.  No labored breathing.  Speech is clear and  coherent with logical content.  Patient is alert and oriented at baseline.   Assessment and Plan:   1. Suspected Covid-19 Virus Infection Abrupt onset of almost flu-like symptoms this morning. Stable at present. Concern for COVID due to community infectivity presently. Will send for testing. She has been enrolled in symptom monitoring program while waiting for results to come in. Supportive measures and OTC medications reviewed with her. Quarantine until  results are in. ER precautions reviewed.     Leeanne Rio, [place] 01/16/2019

## 2019-01-16 NOTE — Progress Notes (Signed)
I have discussed the procedure for the virtual visit with the patient who has given consent to proceed with assessment and treatment.   Vanessa [name] S Labrina Lines, CMA     

## 2019-01-16 NOTE — Patient Instructions (Signed)
Instructions sent to MyChart  Please go to [facility] [place], [place] for COVID testing ([facility]) Quarantine at home until results are in.  Keep hydrated and get plenty of rest. Alternate tylenol and ibuprofen for headache and chills if needed. Can use Mucinex for any congestion noted.   I am enrolling you in a symptom monitoring program so you can update [place] daily on how you are doing.  If you note any difficulty breathing or acute worsening of symptoms, please go to the ER.

## 2019-01-17 LAB — NOVEL CORONAVIRUS, NAA: SARS-CoV-2, NAA: NOT DETECTED

## 2019-01-26 NOTE — Telephone Encounter (Signed)
Pt returned your call to discuss incontinence.

## 2019-01-26 NOTE — Telephone Encounter (Signed)
Message sent to Dr Ardis Hughs for other recommendations.

## 2019-01-27 ENCOUNTER — Telehealth: Payer: Self-pay | Admitting: Gastroenterology

## 2019-01-27 NOTE — Telephone Encounter (Signed)
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 She has been taking Citracel powder daily,and increase fiber in her diet. She was wondering if there was anything else you recommend to control her symptoms. Please advise.  Thanks

## 2019-01-27 NOTE — Telephone Encounter (Signed)
Dr Ardis Hughs, patient states that the only medication she is taking for her bowels is the citrucel powder daily. She has a ROV with you next  Tuesday afternoon. I did mention to her about the once daily Imodium that was recommended, but she did not realize she should be taking that. She would like to discuss at her office visit.

## 2019-01-27 NOTE — Telephone Encounter (Signed)
She needs rov to discuss this further.  Please ask her (without prompting) what she is taking for her bowels.  I am hoping she started the once daily imodium which was recommended in addition to her fiber supplement, many weeks ago.  Let me know, thanks

## 2019-01-29 ENCOUNTER — Other Ambulatory Visit: Payer: Self-pay | Admitting: Family Medicine

## 2019-01-31 ENCOUNTER — Ambulatory Visit: Payer: Commercial Managed Care - PPO | Admitting: Gastroenterology

## 2019-01-31 NOTE — Telephone Encounter (Signed)
Last OV 01/16/19 Alprazolam last filled 12/09/18 #60 with 0

## 2019-02-01 ENCOUNTER — Encounter: Payer: Self-pay | Admitting: Gastroenterology

## 2019-02-01 ENCOUNTER — Telehealth: Payer: Self-pay

## 2019-02-01 ENCOUNTER — Ambulatory Visit (INDEPENDENT_AMBULATORY_CARE_PROVIDER_SITE_OTHER): Payer: Commercial Managed Care - PPO | Admitting: Gastroenterology

## 2019-02-01 ENCOUNTER — Other Ambulatory Visit: Payer: Self-pay

## 2019-02-01 VITALS — BP 110/72 | HR 88 | Temp 97.6°F | Ht 61.0 in | Wt 174.6 lb

## 2019-02-01 DIAGNOSIS — R159 Full incontinence of feces: Secondary | ICD-10-CM | POA: Diagnosis not present

## 2019-02-01 NOTE — Progress Notes (Signed)
Review of pertinent gastrointestinal problems: 1.  Family history of colon cancer.  Her brother had colon cancer.  Colonoscopy May 2015 Dr. Ardis Hughs for chronic constipation and family history of colon cancer found diverticulosis, there was some erythema associated with the diverticulum.  Biopsies showed no obvious chronic or active inflammation.  Colonoscopy June 2020 Dr. Ardis Hughs showed pan diverticulosis, the examination was otherwise normal.  No polyps or cancers.  She was recommended to have repeat colonoscopy at 5-year interval.   HPI: This is a very pleasant 58 year old who is here to discuss chronic fecal incontinence.  This is been going on for about 2 years.  It seems to be getting worse.  She does not have any diarrhea.  Bulking her stool with fiber supplement has not helped decrease her fecal incontinence.  She does not see any bleeding.  She had a colonoscopy just 3 or 4 months ago and no obvious anal disease was noted.  She will have leakage of soft stool on nearly daily basis, especially when lifting heavy objects or bending over.  This is been very embarrassing to her, causes her to have to take time away from work to change her close.  Between these episodes she will have regular bowel movements without significant need for pushing or straining.  No vaginal births, she did have a C-section 24 years ago.  No anal rectal trauma that she can call  Chief complaint is fecal incontinence  ROS: complete GI ROS as described in HPI, all other review negative.  Constitutional:  No unintentional weight loss   Past Medical History:  Diagnosis Date  . Anxiety   . Arthritis   . Chronic knee pain   . Depression   . Diverticulosis   . Gall stone   . Hepatitis C   . Hypertension   . Syphilis     Past Surgical History:  Procedure Laterality Date  . CARPAL TUNNEL RELEASE Left   . CHOLECYSTECTOMY N/A 11/24/2014   Procedure: LAPAROSCOPIC CHOLECYSTECTOMY WITH INTRAOPERATIVE CHOLANGIOGRAM;   Surgeon: Vanessa Pickerel, MD;  Location: [facility];  Service: General;  Laterality: N/A;  . ECTOPIC PREGNANCY SURGERY      Current Outpatient Medications  Medication Sig Dispense Refill  . ALPRAZolam (XANAX) 0.5 MG tablet TAKE 1 TABLET BY MOUTH TWICE A DAY AS NEEDED FOR ANXIETY 60 tablet 0  . benazepril-hydrochlorthiazide (LOTENSIN HCT) 10-12.5 MG tablet TAKE 1 TABLET BY MOUTH EVERY DAY 90 tablet 1  . fluticasone (FLONASE) 50 MCG/ACT nasal spray Place 2 sprays into both nostrils daily. 16 g 6  . ibuprofen (ADVIL,MOTRIN) 800 MG tablet TAKE 1 TABLET BY MOUTH 3 TIMES DAILY AS NEEDED. 90 tablet 1  . Multiple Vitamin (MULTIVITAMIN WITH MINERALS) TABS tablet Take 1 tablet by mouth daily.    . SUBOXONE 8-2 MG FILM Place 0.5 strips under the tongue 2 (two) times daily.    Marland Kitchen venlafaxine XR (EFFEXOR-XR) 150 MG 24 hr capsule TAKE 1 CAPSULE (150 MG TOTAL) BY MOUTH DAILY WITH BREAKFAST. 90 capsule 0  . venlafaxine XR (EFFEXOR-XR) 75 MG 24 hr capsule TAKE 1 CAPSULE DAILY WITH BREAKFAST. TAKE IN COMBINATION WITH 150MG  FOR A TOTAL OF 225MG  DAILY 90 capsule 1   No current facility-administered medications for this visit.     Allergies as of 02/01/2019  . (No Known Allergies)    Family History  Problem Relation Age of Onset  . Hypertension Mother   . Diabetes Mother   . Drug abuse Brother   . Esophageal cancer  Brother 54  . Colon cancer Brother 33  . Hypertension Sister        x's 2  . Diabetes Sister        oldest sister  . Stomach cancer Neg Hx   . Breast cancer Neg Hx   . Rectal cancer Neg Hx     Social History   Socioeconomic History  . Marital status: Single    Spouse name: Not on file  . Number of children: 1  . Years of education: Not on file  . Highest education level: Not on file  Occupational History  . Occupation: Dialysis Tech  Social Needs  . Financial resource strain: Not on file  . Food insecurity    Worry: Not on file    Inability: Not on file  . Transportation needs     Medical: Not on file    Non-medical: Not on file  Tobacco Use  . Smoking status: Former Smoker    Packs/day: 1.00    Years: 35.00    Pack years: 35.00    Types: E-cigarettes    Start date: 05/25/1994    Quit date: 03/03/2016    Years since quitting: 2.9  . Smokeless tobacco: Never Used  Substance and Sexual Activity  . Alcohol use: No    Alcohol/week: 0.0 standard drinks    Comment: "last alcohol was in the 1990's"  . Drug use: Not Currently    Types: Marijuana, "Crack" cocaine    Comment: "last drug use was in the 1990's; never used heroin"  . Sexual activity: Not Currently  Lifestyle  . Physical activity    Days per week: Not on file    Minutes per session: Not on file  . Stress: Not on file  Relationships  . Social Musician on phone: Not on file    Gets together: Not on file    Attends religious service: Not on file    Active member of club or organization: Not on file    Attends meetings of clubs or organizations: Not on file    Relationship status: Not on file  . Intimate partner violence    Fear of current or ex partner: Not on file    Emotionally abused: Not on file    Physically abused: Not on file    Forced sexual activity: Not on file  Other Topics Concern  . Not on file  Social History Narrative   Works at [employer] in [employer]. Lives with daughter.     Physical Exam: BP 110/72   Pulse 88   Temp 97.6 F (36.4 C)   Ht 5\' 1"  (1.549 m)   Wt 174 lb 9.6 oz (79.2 kg)   BMI 32.99 kg/m  Constitutional: generally well-appearing Psychiatric: alert and oriented x3 No peripheral edema noted in lower extremities Rectal examination with female assistant in the room: Normal external anus, no fissures no hemorrhoids.  Anal resting tone seemed quite decreased, with Valsalva there was only minor squeezing at the anus as well.  Assessment and plan: 58 y.o. female with fecal incontinence  She seems to have a weak anal sphincter on examination and history  tends to support that.  Bulking her stool with fiber supplements has actually just made matters worse.  I am going to get formal anorectal manometry testing and she is going to try a single Imodium every morning shortly after waking.  She asked for a work note to be on "light duty" for now and  since these episodes seem to really be when she is bending over and straining to pick things up oftentimes I think that is perfectly reasonable.  Pending anorectal manometry results she may be a candidate for biofeedback therapy.  Please see the "Patient Instructions" section for addition details about the plan.  Rob Buntinganiel Bryttany Tortorelli, MD [facility] 02/01/2019, 1:55 PM

## 2019-02-01 NOTE — Patient Instructions (Addendum)
You have been scheduled for a ano rectal manometry at [facility] on 02/20/19 at 1030am. Please arrive 30 minutes prior to your procedure for registration. You will need to go to outpatient registration (1st floor of the hospital) first. Make certain to bring your insurance cards as well as a complete list of medications.  Please remember the following:  1) Do not take any muscle relaxants, xanax (alprazolam) or ativan for 1 day prior to your test as well as the day of the test.  2) Nothing to eat or drink for 4 hours before your test.  3) Hold all diabetic medications/insulin the morning of the test. You may eat and take your medications after the test.  It will take at least 2 weeks to receive the results of this test from your physician. ------------------------------------------     Due to recent COVID-19 restrictions implemented by our local and state authorities and in an effort to keep both patients and staff as safe as possible, our hospital system now requires COVID-19 testing prior to any scheduled hospital procedure. Please go to our [facility] location drive thru testing site ([address] [place], [place] [address]) on 02/17/19 at  1010am. There will be multiple testing areas, the first checkpoint being for pre-procedure/surgery testing. Get into the right (yellow) lane that leads to the PAT testing team. You will not be billed at the time of testing but may receive a bill later depending on your insurance. The approximate cost of the test is $100. You must agree to quarantine from the time of your testing until the procedure date on 02/20/19 . This should include staying at home with ONLY the people you live with. Avoid take-out, grocery store shopping or leaving the house for any non-emergent reason. Failure to have your COVID-19 test done on the date and time you have been scheduled will result in cancellation of procedure. Please call our office at [phone] if  you have any questions.   Start taking 1 imodium every morning  Thank you for entrusting me with your care and choosing [facility].  Dr Ardis Hughs   ________________________________________________________________________________

## 2019-02-01 NOTE — Telephone Encounter (Signed)
Patient called in stating that she had no idea why her Xanax has been denied but she urgently needs the refill sent to pharmacy. Stated she "HAS to have this medication."  Informed patient that there were not any messages as to why her medication was denied. Informed patient that PCP was not in the office this week, but checking her charts and that I would send refill request to her. Wanted to know if Einar Pheasant was in the office because "he can fill it for me!"   Last refill: 7.17.20 #60, 0 Last OV: 8.24.20 dx. Possible COVID-19

## 2019-02-02 ENCOUNTER — Telehealth: Payer: Self-pay | Admitting: Gastroenterology

## 2019-02-02 NOTE — Telephone Encounter (Signed)
The pt was provided with the medical records phone and fax number.  The pt has been advised of the information and verbalized understanding.

## 2019-02-02 NOTE — Telephone Encounter (Signed)
Alprazolam filled yesterday.

## 2019-02-02 NOTE — Telephone Encounter (Signed)
Pt would like to speak with you regarding a form from her work that she will be faxing shortly.

## 2019-02-06 ENCOUNTER — Telehealth: Payer: Self-pay | Admitting: Gastroenterology

## 2019-02-06 NOTE — Telephone Encounter (Signed)
Spoke with patient, Dr Ardis Hughs reviewed patients job description and wrote his recommendations for light duty at her place of employment. Form has been faxed to [employer].

## 2019-02-06 NOTE — Telephone Encounter (Signed)
Vanessa [name] have you seen any paperwork from medical records for this pt?

## 2019-02-09 ENCOUNTER — Other Ambulatory Visit: Payer: Self-pay | Admitting: Family Medicine

## 2019-02-10 ENCOUNTER — Other Ambulatory Visit: Payer: Self-pay | Admitting: Family Medicine

## 2019-02-17 ENCOUNTER — Other Ambulatory Visit (HOSPITAL_COMMUNITY)
Admission: RE | Admit: 2019-02-17 | Discharge: 2019-02-17 | Disposition: A | Discharge status: Home / Self Care | Payer: Commercial Managed Care - PPO | Source: Ambulatory Visit | Attending: Gastroenterology | Admitting: Gastroenterology

## 2019-02-17 DIAGNOSIS — Z20828 Contact with and (suspected) exposure to other viral communicable diseases: Secondary | ICD-10-CM | POA: Insufficient documentation

## 2019-02-17 DIAGNOSIS — Z01812 Encounter for preprocedural laboratory examination: Secondary | ICD-10-CM | POA: Diagnosis present

## 2019-02-18 LAB — NOVEL CORONAVIRUS, NAA (HOSP ORDER, SEND-OUT TO REF LAB; TAT 18-24 HRS): SARS-CoV-2, NAA: NOT DETECTED

## 2019-02-20 ENCOUNTER — Ambulatory Visit (HOSPITAL_COMMUNITY)
Admission: RE | Admit: 2019-02-20 | Discharge: 2019-02-20 | Disposition: A | Discharge status: Home / Self Care | Payer: Commercial Managed Care - PPO | Attending: Gastroenterology | Admitting: Gastroenterology

## 2019-02-20 ENCOUNTER — Encounter (HOSPITAL_COMMUNITY)
Admission: RE | Disposition: A | Discharge status: Home / Self Care | Payer: Self-pay | Source: Home / Self Care | Attending: Gastroenterology

## 2019-02-20 DIAGNOSIS — R159 Full incontinence of feces: Secondary | ICD-10-CM | POA: Diagnosis not present

## 2019-02-20 HISTORY — PX: ANAL RECTAL MANOMETRY: SHX6358

## 2019-02-20 SURGERY — MANOMETRY, ANORECTAL

## 2019-02-20 MED ORDER — FLEET ENEMA 7-19 GM/118ML RE ENEM
ENEMA | RECTAL | Status: AC
  Filled 2019-02-20: qty 2

## 2019-02-20 NOTE — Progress Notes (Signed)
Patient did not receive instructions for fleets enemas.  Upon rectal exam, stool in rectum.  Discussed with Dr. Silverio Decamp.  Verbal order for 2 fleets enemas.  Patient tolerated well with good results.  Performed anal rectal manometry per protocol.  Patient tolerated well without any complaint or complications.  Balloon expulsion test performed per protocol.  Patient tolerated well without complaint or complication.  Patient able to expel balloon after 25 seconds.

## 2019-02-21 ENCOUNTER — Encounter (HOSPITAL_COMMUNITY): Payer: Self-pay | Admitting: Gastroenterology

## 2019-02-22 DIAGNOSIS — R159 Full incontinence of feces: Secondary | ICD-10-CM

## 2019-02-27 ENCOUNTER — Other Ambulatory Visit: Payer: Self-pay | Admitting: Family Medicine

## 2019-03-06 ENCOUNTER — Telehealth: Payer: Self-pay | Admitting: Gastroenterology

## 2019-03-06 DIAGNOSIS — M6289 Other specified disorders of muscle: Secondary | ICD-10-CM

## 2019-03-06 NOTE — Telephone Encounter (Signed)
Pt had surgery at [facility] by Dr. Ardis Hughs 02/20/19 and would like to know plan of care.

## 2019-03-06 NOTE — Telephone Encounter (Signed)
Vanessa Banister, MD  Timothy Lasso, RN          Please call the patient. The anorectal manometry shows pelvic floor dyssynergy. She needs referral to PT pelvic floor for retraining, biofeedback. Thanks

## 2019-03-06 NOTE — Telephone Encounter (Signed)
I have left a message on the pt voice mail that a message has been sent to My Chart and PT referral has been made.

## 2019-03-10 ENCOUNTER — Telehealth: Payer: Self-pay | Admitting: Gastroenterology

## 2019-03-10 NOTE — Telephone Encounter (Signed)
The pt states she has not received a phone call from PT to make an appt.  I have checked into the referral and that office made note that they have tried to reach the pt and left several messages for her to call back.  I did pass this on to the pt and she will give them a call to set up an appt.

## 2019-03-10 NOTE — Telephone Encounter (Signed)
Pt requested a call back to discuss "bowel leakage."

## 2019-03-26 ENCOUNTER — Other Ambulatory Visit: Payer: Self-pay | Admitting: Physician Assistant

## 2019-03-26 NOTE — Telephone Encounter (Signed)
Will defer further refills of patient's medications to PCP  

## 2019-03-28 ENCOUNTER — Other Ambulatory Visit: Payer: Self-pay | Admitting: General Practice

## 2019-03-28 NOTE — Telephone Encounter (Signed)
Last OV 01/16/19 Alprazolam last filled 02/01/19 #60 with 0

## 2019-03-29 ENCOUNTER — Telehealth: Payer: Self-pay | Admitting: Gastroenterology

## 2019-03-29 MED ORDER — ALPRAZOLAM 0.5 MG PO TABS: 0.5000 mg | ORAL_TABLET | Freq: Two times a day (BID) | ORAL | 0 refills | Status: DC | PRN

## 2019-03-29 NOTE — Telephone Encounter (Signed)
Pt calling in check in the refill request. Pt can be reachd at the home #

## 2019-03-29 NOTE — Telephone Encounter (Signed)
Dr Ardis Hughs the pt states she can not afford to go to PT and says she will do what she can at home.  I advised her to at least try to go to one appointment and get some help with what exercise she needs and education on how to proceed at home.  She would like to know if she can get an extended note for work to avoid bending over and picking up heavy objects until she can have at least 1 appt and get some education on PT.  Please advise

## 2019-03-29 NOTE — Telephone Encounter (Signed)
I think that is fine.  Can you please send a note that she avoid bending over and picking up heavy objects lets say for the last 6 to 8 weeks or so.  Please work with her and physical therapy to get an appointment on the books for her, I think she is going to be happy with the results when she starts biofeedback type exercises.  Thanks

## 2019-03-29 NOTE — Telephone Encounter (Signed)
Pt states the fax number for her job is [phone]  Attn Lawerance Bach

## 2019-03-29 NOTE — Telephone Encounter (Signed)
Patient is calling about the Physical Therapy. She stated that she can not afford to pay to go to physical therapy but can do physical therapy on her own. She is asking if this would be okay and if Dr. Ardis Hughs can extend her doctors note for work.

## 2019-03-29 NOTE — Telephone Encounter (Signed)
The pt has been advised and aware that we will fax her letter to her employer when she calls back with her fax number.  She has already called to make an appt with PT

## 2019-03-30 MED ORDER — ALPRAZOLAM 0.5 MG PO TABS: 0.5000 mg | ORAL_TABLET | Freq: Two times a day (BID) | ORAL | 0 refills | Status: DC | PRN

## 2019-03-30 NOTE — Addendum Note (Signed)
Addended by: Davis Gourd on: 03/30/2019 08:19 AM   Modules accepted: Orders

## 2019-04-11 ENCOUNTER — Telehealth: Payer: Self-pay | Admitting: Gastroenterology

## 2019-04-11 NOTE — Telephone Encounter (Signed)
The pt was referred to rehab for biofeedback.  That office has tried to reach her on several occasions with no response.  Also, the letter for her employer was sent to her My Chart as requested on 11/4. Will call pt back after 12:30 pm

## 2019-04-12 NOTE — Telephone Encounter (Signed)
Left message for pt regarding the biofeedback-see note below. Also let her know letter had been sent via mychart but if she needs a hard copy mailed to let our office know.

## 2019-05-07 ENCOUNTER — Other Ambulatory Visit: Payer: Self-pay | Admitting: Family Medicine

## 2019-05-09 ENCOUNTER — Other Ambulatory Visit: Payer: Self-pay | Admitting: Family Medicine

## 2019-05-11 ENCOUNTER — Telehealth: Payer: Self-pay

## 2019-05-11 NOTE — Telephone Encounter (Signed)
error 

## 2019-05-12 ENCOUNTER — Ambulatory Visit: Payer: Commercial Managed Care - PPO | Admitting: Physician Assistant

## 2019-05-25 ENCOUNTER — Ambulatory Visit: Payer: Commercial Managed Care - PPO | Admitting: Physician Assistant

## 2019-05-30 ENCOUNTER — Ambulatory Visit: Payer: Commercial Managed Care - PPO | Admitting: Family Medicine

## 2019-06-15 ENCOUNTER — Ambulatory Visit: Payer: Commercial Managed Care - PPO | Admitting: Family Medicine

## 2019-06-15 ENCOUNTER — Encounter: Payer: Self-pay | Admitting: Family Medicine

## 2019-06-15 ENCOUNTER — Other Ambulatory Visit: Payer: Self-pay

## 2019-06-15 VITALS — BP 116/78 | HR 78 | Temp 96.6°F | Resp 16 | Ht 61.0 in | Wt 171.2 lb

## 2019-06-15 DIAGNOSIS — Z20822 Contact with and (suspected) exposure to covid-19: Secondary | ICD-10-CM | POA: Diagnosis not present

## 2019-06-15 DIAGNOSIS — M5410 Radiculopathy, site unspecified: Secondary | ICD-10-CM

## 2019-06-15 MED ORDER — TIZANIDINE HCL 4 MG PO TABS: 4.0000 mg | ORAL_TABLET | Freq: Three times a day (TID) | ORAL | 0 refills | Status: DC | PRN

## 2019-06-15 NOTE — Patient Instructions (Signed)
Follow up as needed or as scheduled USE the Tizanidine nightly to improve back spasm USE a heating pad to help w/ muscle spasm Call [phone] to schedule a testing appt Remain out of work until your test results are available Call with any questions or concerns Stay Safe!  Stay Healthy!

## 2019-06-15 NOTE — Progress Notes (Signed)
   Subjective:    Patient ID: Vanessa [name], female    DOB: [birth year], 59 y.o.   MRN: [id number]  HPI Back pain- 'it comes and goes'.  sxs start in lower back and radiate into butt and thighs bilaterally.  sxs started ~3 weeks ago.  No known injury.  Was on her way to work when pain started.  When pain starts it will last '10-15 minutes'.  Typically will only occur once every few days.  No bowel or bladder incontinence.  Nothing improves sxs, will resolve spontaneously.  Nothing worsens sxs.  No associated numbness/tingling.  Last occurred 1 week ago.  No hx of similar.  sxs are worse in the AM.  'I want to get tested for COVID'- took sister (who is +) for her infusion yesterday.  Pt is currently asymptomatic.  They tested + ~8 days ago.   Review of Systems For ROS see HPI   This visit occurred during the SARS-CoV-2 public health emergency.  Safety protocols were in place, including screening questions prior to the visit, additional usage of staff PPE, and extensive cleaning of exam room while observing appropriate contact time as indicated for disinfecting solutions.       Objective:   Physical Exam Vitals reviewed.  Constitutional:      Appearance: Normal appearance. She is obese.  HENT:     Head: Normocephalic and atraumatic.  Musculoskeletal:        General: No tenderness (no TTP over spine, lumbar paraspinal muscles, either sciatic notch).  Neurological:     General: No focal deficit present.     Mental Status: She is alert and oriented to person, place, and time.     Comments: (-) SLR bilaterally  Psychiatric:        Mood and Affect: Mood normal.        Behavior: Behavior normal.        Thought Content: Thought content normal.           Assessment & Plan:  Radicular low back pain- new to provider.  Pt reports last episode was ~1 week ago.  Asymptomatic today.  PE WNL.  No red flags on hx.  Suspect muscle spasm- possibly due to mattress or sleeping position.  Start  muscle relaxer nightly and use heating pad to improve sxs.  Reviewed supportive care and red flags that should prompt return.  Pt expressed understanding and is in agreement w/ plan.   COVID exposure- both of pt's sisters have tested positive and she has been driving them to their infusions.  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  I encouraged her to get tested next week, avoid contact if possible, and stay home from work until test results are available.  Pt expressed understanding and is in agreement w/ plan.

## 2019-06-19 ENCOUNTER — Ambulatory Visit: Payer: Commercial Managed Care - PPO | Attending: Internal Medicine

## 2019-06-19 DIAGNOSIS — Z20822 Contact with and (suspected) exposure to covid-19: Secondary | ICD-10-CM

## 2019-06-20 ENCOUNTER — Other Ambulatory Visit: Payer: Commercial Managed Care - PPO

## 2019-06-20 LAB — NOVEL CORONAVIRUS, NAA: SARS-CoV-2, NAA: NOT DETECTED

## 2019-07-08 ENCOUNTER — Other Ambulatory Visit: Payer: Self-pay | Admitting: Family Medicine

## 2019-07-09 ENCOUNTER — Other Ambulatory Visit: Payer: Self-pay | Admitting: Family Medicine

## 2019-07-10 NOTE — Telephone Encounter (Signed)
Last OV 06/15/19 Alprazolam last filled 03/30/19 #60 with 0

## 2019-07-28 ENCOUNTER — Other Ambulatory Visit: Payer: Self-pay

## 2019-07-28 ENCOUNTER — Encounter: Payer: Self-pay | Admitting: Family Medicine

## 2019-07-28 ENCOUNTER — Ambulatory Visit (INDEPENDENT_AMBULATORY_CARE_PROVIDER_SITE_OTHER): Payer: Commercial Managed Care - PPO | Admitting: Family Medicine

## 2019-07-28 DIAGNOSIS — Z20822 Contact with and (suspected) exposure to covid-19: Secondary | ICD-10-CM | POA: Diagnosis not present

## 2019-07-28 MED ORDER — ONDANSETRON HCL 4 MG PO TABS: 4.0000 mg | ORAL_TABLET | Freq: Three times a day (TID) | ORAL | 0 refills | Status: DC | PRN

## 2019-07-28 NOTE — Progress Notes (Signed)
I have discussed the procedure for the virtual visit with the patient who has given consent to proceed with assessment and treatment.   Pt unable to obtain vitals. Symptoms all started today.    Geannie Risen, CMA

## 2019-07-28 NOTE — Progress Notes (Signed)
Virtual Visit via Video   I connected with patient on 07/28/19 at 10:00 AM EST by a video enabled telemedicine application and verified that I am speaking with the correct person using two identifiers.  Location patient: Home Location provider: [facility], Office Persons participating in the virtual visit: Patient, Provider, CMA (Jess B)  I discussed the limitations of evaluation and management by telemedicine and the availability of in person appointments. The patient expressed understanding and agreed to proceed.  Subjective:   HPI:   Viral syndrome- 'I feel like crap'.  sxs started this AM.  Pt went to work anyway.  Bilateral leg pain, back pain, HA.  'i'm just so weak'.  No fever.  Job did a COVID test prior to leaving work- negative rapid test.  + nausea.  Denies chest congestion/tightness/SOB.  No cough.  No changes to taste or smell.  No relief w/ ibuprofen.  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  ROS:   See pertinent positives and negatives per HPI.  Patient Active Problem List   Diagnosis Date Noted  . Incontinence of feces   . Obesity (BMI 30-39.9) 07/12/2018  . Allergic rhinitis 07/22/2017  . Tear of medial meniscus of right knee, current 03/03/2017  . Tear of lateral meniscus of right knee, current 03/03/2017  . Vitamin D deficiency 12/04/2016  . Baker's cyst of knee, right 05/21/2016  . Edema 12/03/2015  . Liver fibrosis 07/25/2015  . Corneal ulcers and infections 02/01/2015  . Uveitis 02/01/2015  . S/P laparoscopic cholecystectomy 11/25/2014  . Knee pain, right 07/17/2014  . Physical exam 07/17/2014  . Pap smear for cervical cancer screening 07/17/2014  . Wheezing 04/06/2014  . Hyperlipidemia 01/12/2014  . History of hepatitis C 04/27/2013  . 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 Hypertension   . Depression     Social History   Tobacco Use  . Smoking status: Former Smoker    Packs/day: 1.00    Years: 35.00    Pack  years: 35.00    Types: E-cigarettes    Start date: 05/25/1994    Quit date: 03/03/2016    Years since quitting: 3.4  . Smokeless tobacco: Never Used  Substance Use Topics  . Alcohol use: No    Alcohol/week: 0.0 standard drinks    Comment: "last alcohol was in the 1990's"    Current Outpatient Medications:  .  ALPRAZolam (XANAX) 0.5 MG tablet, Take 1 tablet (0.5 mg total) by mouth 2 (two) times daily as needed for anxiety., Disp: 60 tablet, Rfl: 0 .  ALPRAZolam (XANAX) 0.5 MG tablet, TAKE 1 TABLET (0.5 MG TOTAL) BY MOUTH 2 (TWO) TIMES DAILY AS NEEDED FOR ANXIETY., Disp: 60 tablet, Rfl: 0 .  ascorbic acid (VITAMIN C) 1000 MG tablet, Take by mouth., Disp: , Rfl:  .  benazepril-hydrochlorthiazide (LOTENSIN HCT) 10-12.5 MG tablet, TAKE 1 TABLET BY MOUTH EVERY DAY, Disp: 90 tablet, Rfl: 1 .  fluticasone (FLONASE) 50 MCG/ACT nasal spray, Place 2 sprays into both nostrils daily., Disp: 16 g, Rfl: 6 .  ibuprofen (ADVIL) 800 MG tablet, TAKE 1 TABLET BY MOUTH THREE TIMES A DAY AS NEEDED, Disp: 90 tablet, Rfl: 1 .  Multiple Vitamin (MULTIVITAMIN WITH MINERALS) TABS tablet, Take 1 tablet by mouth daily., Disp: , Rfl:  .  SUBOXONE 8-2 MG FILM, Place 0.5 strips under the tongue 2 (two) times daily., Disp: , Rfl:  .  tiZANidine (ZANAFLEX) 4 MG tablet, Take 1 tablet (4 mg total) by mouth  every 8 (eight) hours as needed for muscle spasms., Disp: 30 tablet, Rfl: 0 .  venlafaxine XR (EFFEXOR-XR) 150 MG 24 hr capsule, TAKE 1 CAPSULE (150 MG TOTAL) BY MOUTH DAILY WITH BREAKFAST., Disp: 90 capsule, Rfl: 0 .  venlafaxine XR (EFFEXOR-XR) 75 MG 24 hr capsule, TAKE 1 CAPSULE DAILY WITH BREAKFAST. TAKE IN COMBINATION WITH 150MG  FOR A TOTAL OF 225MG  DAILY, Disp: 90 capsule, Rfl: 1  No Known Allergies  Objective:   There were no vitals taken for this visit. AAOx3, NAD NCAT, EOMI No obvious CN deficits Coloring WNL Pt is able to speak clearly, coherently without shortness of breath or increased work of breathing.   Thought process is linear.  Mood is appropriate.   Assessment and Plan:   Suspected COVID- pt works in a dialysis center and her sisters recently were hospitalized w/ COVID.  She reports body aches, HAs, nausea.  Denies respiratory sxs.  Will send Zofran for nausea.  Will enroll in symptom monitoring.  Reviewed that rapid COVID tests are not as accurate and that if symptoms don't improve/change/worsen she will need PCR testing.  Discussed need to start alternating tylenol/ibuprofen, increase fluids, add Vit C/Vit D/Zinc.  Reviewed supportive care and red flags that should prompt return.  Pt expressed understanding and is in agreement w/ plan.    Annye Asa, MD 07/28/2019

## 2019-08-29 ENCOUNTER — Other Ambulatory Visit: Payer: Self-pay | Admitting: Family Medicine

## 2019-08-30 NOTE — Telephone Encounter (Signed)
Last OV 07/28/19 Alprazolam last filled 07/10/19 #60 with 0

## 2019-09-14 ENCOUNTER — Ambulatory Visit: Payer: Commercial Managed Care - PPO | Attending: Internal Medicine

## 2019-09-14 DIAGNOSIS — Z23 Encounter for immunization: Secondary | ICD-10-CM

## 2019-09-14 NOTE — Progress Notes (Signed)
   Covid-19 Vaccination Clinic  Name:  Vanessa [name]    MRN: [id number] DOB: [birth year]  09/14/2019  Ms. Vanessa [name] was observed post Covid-19 immunization for 15 minutes without incident. She was provided with Vaccine Information Sheet and instruction to access the V-Safe system.   Ms. Vanessa [name] was instructed to call 911 with any severe reactions post vaccine: Marland Kitchen Difficulty breathing  . Swelling of face and throat  . A fast heartbeat  . A bad rash all over body  . Dizziness and weakness   Immunizations Administered    Name Date Dose VIS Date Route   Pfizer COVID-19 Vaccine 09/14/2019 11:45 AM 0.3 mL 07/19/2018 Intramuscular   Manufacturer: [employer], [employer]   Lot: [id number]   NDC: [address]-8

## 2019-10-09 ENCOUNTER — Ambulatory Visit: Payer: Commercial Managed Care - PPO

## 2019-10-12 ENCOUNTER — Ambulatory Visit: Payer: Commercial Managed Care - PPO | Attending: Internal Medicine

## 2019-10-12 DIAGNOSIS — Z23 Encounter for immunization: Secondary | ICD-10-CM

## 2019-10-12 NOTE — Progress Notes (Signed)
   Covid-19 Vaccination Clinic  Name:  Vanessa [name]    MRN: [id number] DOB: [birth year]  10/12/2019  [name] was observed post Covid-19 immunization for 15 minutes without incident. She was provided with Vaccine Information Sheet and instruction to access the V-Safe system.   [name] was instructed to call 911 with any severe reactions post vaccine: Marland Kitchen Difficulty breathing  . Swelling of face and throat  . A fast heartbeat  . A bad rash all over body  . Dizziness and weakness   Immunizations Administered    Name Date Dose VIS Date Route   Pfizer COVID-19 Vaccine 10/12/2019  8:11 AM 0.3 mL 07/19/2018 Intramuscular   Manufacturer: [employer], [employer]   Lot: [id number]   NDC: [address]-3

## 2019-10-17 ENCOUNTER — Ambulatory Visit (INDEPENDENT_AMBULATORY_CARE_PROVIDER_SITE_OTHER): Payer: Commercial Managed Care - PPO | Admitting: Family Medicine

## 2019-10-17 ENCOUNTER — Other Ambulatory Visit: Payer: Self-pay

## 2019-10-17 ENCOUNTER — Encounter: Payer: Self-pay | Admitting: Family Medicine

## 2019-10-17 VITALS — BP 121/72 | HR 78 | Temp 98.0°F | Resp 16 | Ht 61.0 in | Wt 170.2 lb

## 2019-10-17 DIAGNOSIS — E559 Vitamin D deficiency, unspecified: Secondary | ICD-10-CM

## 2019-10-17 DIAGNOSIS — I10 Essential (primary) hypertension: Secondary | ICD-10-CM

## 2019-10-17 DIAGNOSIS — Z0000 Encounter for general adult medical examination without abnormal findings: Secondary | ICD-10-CM

## 2019-10-17 DIAGNOSIS — F1911 Other psychoactive substance abuse, in remission: Secondary | ICD-10-CM | POA: Diagnosis not present

## 2019-10-17 LAB — BASIC METABOLIC PANEL
BUN: 20 mg/dL (ref 6–23)
CO2: 33 mEq/L — ABNORMAL HIGH (ref 19–32)
Calcium: 9.3 mg/dL (ref 8.4–10.5)
Chloride: 101 mEq/L (ref 96–112)
Creatinine, Ser: 0.6 mg/dL (ref 0.40–1.20)
GFR: 102.59 mL/min (ref >60.00)
Glucose, Bld: 101 mg/dL — ABNORMAL HIGH (ref 70–99)
Potassium: 4.4 mEq/L (ref 3.5–5.1)
Sodium: 139 mEq/L (ref 135–145)

## 2019-10-17 LAB — CBC WITH DIFFERENTIAL/PLATELET
Basophils Absolute: 0.1 10*3/uL (ref 0.0–0.1)
Basophils Relative: 1 % (ref 0.0–3.0)
Eosinophils Absolute: 0 10*3/uL (ref 0.0–0.7)
Eosinophils Relative: 0.7 % (ref 0.0–5.0)
HCT: 42 % (ref 36.0–46.0)
Hemoglobin: 13.9 g/dL (ref 12.0–15.0)
Lymphocytes Relative: 29.4 % (ref 12.0–46.0)
Lymphs Abs: 2 10*3/uL (ref 0.7–4.0)
MCHC: 33.1 g/dL (ref 30.0–36.0)
MCV: 88.6 fl (ref 78.0–100.0)
Monocytes Absolute: 0.3 10*3/uL (ref 0.1–1.0)
Monocytes Relative: 4.6 % (ref 3.0–12.0)
Neutro Abs: 4.3 10*3/uL (ref 1.4–7.7)
Neutrophils Relative %: 64.3 % (ref 43.0–77.0)
Platelets: 221 10*3/uL (ref 150.0–400.0)
RBC: 4.74 Mil/uL (ref 3.87–5.11)
RDW: 15 % (ref 11.5–15.5)
WBC: 6.7 10*3/uL (ref 4.0–10.5)

## 2019-10-17 LAB — VITAMIN D 25 HYDROXY (VIT D DEFICIENCY, FRACTURES): VITD: 15.97 ng/mL — ABNORMAL LOW (ref 30.00–100.00)

## 2019-10-17 LAB — LIPID PANEL
Cholesterol: 199 mg/dL (ref 0–200)
HDL: 51.9 mg/dL (ref >39.00)
LDL Cholesterol: 126 mg/dL — ABNORMAL HIGH (ref 0–99)
NonHDL: 147.46
Total CHOL/HDL Ratio: 4
Triglycerides: 107 mg/dL (ref 0.0–149.0)
VLDL: 21.4 mg/dL (ref 0.0–40.0)

## 2019-10-17 LAB — TSH: TSH: 2.35 u[IU]/mL (ref 0.35–4.50)

## 2019-10-17 LAB — HEPATIC FUNCTION PANEL
ALT: 18 U/L (ref 0–35)
AST: 17 U/L (ref 0–37)
Albumin: 4.2 g/dL (ref 3.5–5.2)
Alkaline Phosphatase: 58 U/L (ref 39–117)
Bilirubin, Direct: 0 mg/dL (ref 0.0–0.3)
Total Bilirubin: 0.2 mg/dL (ref 0.2–1.2)
Total Protein: 7.2 g/dL (ref 6.0–8.3)

## 2019-10-17 NOTE — Patient Instructions (Signed)
Follow up in 6 months to recheck BP We'll notify you of your lab results and make any changes if needed Continue to work on healthy diet and regular exercise- you can do it! You are due for a mammogram in September- please call and schedule Call with any questions or concerns Have a great summer!!!

## 2019-10-17 NOTE — Assessment & Plan Note (Signed)
Pt has hx of this.  Check labs and replete prn. 

## 2019-10-17 NOTE — Assessment & Plan Note (Signed)
Chronic problem.  Well controlled today.  Asymptomatic.  Check labs.  No anticipated med changes.  Will follow. 

## 2019-10-17 NOTE — Progress Notes (Signed)
   Subjective:    Patient ID: Vanessa [name], female    DOB: 1961-05-05, 59 y.o.   MRN: [id number]  HPI CPE- UTD on colonoscopy, mammo (due in Sept), pap   Review of Systems Patient reports no vision/ hearing changes, adenopathy,fever, weight change,  persistant/recurrent hoarseness , swallowing issues, chest pain, palpitations, edema, persistant/recurrent cough, hemoptysis, dyspnea (rest/exertional/paroxysmal nocturnal), gastrointestinal bleeding (melena, rectal bleeding), abdominal pain, significant heartburn, GU symptoms (dysuria, hematuria, incontinence), Gyn symptoms (abnormal  bleeding, pain),  syncope, focal weakness, memory loss, numbness & tingling, skin/hair/nail changes, abnormal bruising or bleeding, anxiety, or depression.   + continues to have fecal leakage- never saw PT for pelvic floor therapy (# provided)  This visit occurred during the SARS-CoV-2 public health emergency.  Safety protocols were in place, including screening questions prior to the visit, additional usage of staff PPE, and extensive cleaning of exam room while observing appropriate contact time as indicated for disinfecting solutions.     Objective:   Physical Exam General Appearance:    Alert, cooperative, no distress, appears stated age  Head:    Normocephalic, without obvious abnormality, atraumatic  Eyes:    PERRL, conjunctiva/corneas clear, EOM's intact, fundi    benign, both eyes  Ears:    Normal TM's and external ear canals, both ears  Nose:   Deferred due to COVID  Throat:   Neck:   Supple, symmetrical, trachea midline, no adenopathy;    Thyroid: no enlargement/tenderness/nodules  Back:     Symmetric, no curvature, ROM normal, no CVA tenderness  Lungs:     Clear to auscultation bilaterally, respirations unlabored  Chest Wall:    No tenderness or deformity   Heart:    Regular rate and rhythm, S1 and S2 normal, no murmur, rub   or gallop  Breast Exam:    Deferred to mammo  Abdomen:     Soft,  non-tender, bowel sounds active all four quadrants,    no masses, no organomegaly  Genitalia:    Deferred due to recent exam  Rectal:    Extremities:   Extremities normal, atraumatic, no cyanosis or edema  Pulses:   2+ and symmetric all extremities  Skin:   Skin color, texture, turgor normal, no rashes or lesions  Lymph nodes:   Cervical, supraclavicular, and axillary nodes normal  Neurologic:   CNII-XII intact, normal strength, sensation and reflexes    throughout          Assessment & Plan:

## 2019-10-17 NOTE — Assessment & Plan Note (Signed)
Pt's PE WNL w/ exception of obesity.  UTD on colonoscopy, pap, immunizations.  Due for mammo in September.  Check labs.  Anticipatory guidance provided.

## 2019-10-17 NOTE — Assessment & Plan Note (Signed)
Continues on Suboxone.

## 2019-10-18 ENCOUNTER — Other Ambulatory Visit: Payer: Self-pay | Admitting: General Practice

## 2019-10-18 MED ORDER — VITAMIN D (ERGOCALCIFEROL) 1.25 MG (50000 UNIT) PO CAPS
50000.0000 [IU] | ORAL_CAPSULE | ORAL | 0 refills | Status: DC
Start: 2019-10-18 — End: 2020-11-04

## 2019-10-25 ENCOUNTER — Other Ambulatory Visit: Payer: Self-pay | Admitting: Family Medicine

## 2019-10-25 NOTE — Telephone Encounter (Signed)
Last OV 10/17/19 Alprazolam last filled 08/31/19 #60 with 0

## 2019-11-04 ENCOUNTER — Other Ambulatory Visit: Payer: Self-pay | Admitting: Family Medicine

## 2019-11-08 ENCOUNTER — Telehealth: Payer: Self-pay | Admitting: General Practice

## 2019-11-08 NOTE — Telephone Encounter (Signed)
Received a surgical clearance form from [facility] for a Right, TKR scheduled for 12-01-19.   Last CPE was 10/17/19, last EKG 12/02/2017

## 2019-11-09 ENCOUNTER — Other Ambulatory Visit: Payer: Self-pay | Admitting: Orthopedic Surgery

## 2019-11-10 NOTE — Telephone Encounter (Signed)
FYI

## 2019-11-10 NOTE — Telephone Encounter (Signed)
I can sign the form based on medical physical but will need nurse visit for EKG

## 2019-11-13 NOTE — Telephone Encounter (Signed)
Noted  

## 2019-11-13 NOTE — Telephone Encounter (Signed)
Pt has been scheduled for 11/15/19

## 2019-11-15 ENCOUNTER — Other Ambulatory Visit: Payer: Self-pay

## 2019-11-15 ENCOUNTER — Ambulatory Visit (INDEPENDENT_AMBULATORY_CARE_PROVIDER_SITE_OTHER): Payer: Commercial Managed Care - PPO

## 2019-11-15 DIAGNOSIS — Z01818 Encounter for other preprocedural examination: Secondary | ICD-10-CM | POA: Diagnosis not present

## 2019-11-16 NOTE — Progress Notes (Signed)
Faxed to [facility]

## 2019-11-18 ENCOUNTER — Other Ambulatory Visit: Payer: Self-pay | Admitting: Family Medicine

## 2019-11-21 NOTE — Patient Instructions (Addendum)
DUE TO COVID-19 ONLY ONE VISITOR IS ALLOWED TO COME WITH YOU AND STAY IN THE WAITING ROOM ONLY DURING PRE OP AND PROCEDURE DAY OF SURGERY. THE 2 VISITORS  MAY VISIT WITH YOU AFTER SURGERY IN YOUR PRIVATE ROOM DURING VISITING HOURS ONLY!  YOU NEED TO HAVE A COVID 19 TEST ON_7/6______ @_______ , THIS TEST MUST BE DONE BEFORE SURGERY, COME  [address], [place] , .  [facility]) ONCE YOUR COVID TEST IS COMPLETED, PLEASE BEGIN THE QUARANTINE INSTRUCTIONS AS OUTLINED IN YOUR HANDOUT.                Vanessa [name]   Your procedure is scheduled on: 12/01/19   Report to [facility] Main  Entrance   Report to admitting at  10:45 AM     Call this number if you have problems the morning of surgery [phone]    BRUSH YOUR TEETH MORNING OF SURGERY AND RINSE YOUR MOUTH OUT, NO CHEWING GUM CANDY OR MINTS.    Do not eat food After Midnight.   YOU MAY HAVE CLEAR LIQUIDS FROM MIDNIGHT UNTIL 10:00 AM    CLEAR LIQUID DIET   Foods Allowed                                                                     Foods Excluded  Coffee and tea, regular and decaf                             liquids that you cannot  Plain Jell-O any favor except red or purple                                           see through such as: Fruit ices (not with fruit pulp)                                     milk, soups, orange juice  Iced Popsicles                                    All solid food Carbonated beverages, regular and diet                                     apple juices Sports drinks like Gatorade Lightly seasoned clear broth or consume Sugar, honey syrup    At 10:00 AM Please finish the prescribed Pre-Surgery  drink.   Nothing by mouth after you finish the  drink !   Take these medicines the morning of surgery with A SIP OF WATER: Effexor XR, Flonase if needed. Hold your Suboxone for 3 days prior to surgery if possible                                  You  may not have any metal on your body including hair pins and              piercings  Do not wear jewelry, make-up, lotions, powders or perfumes, deodorant             Do not wear nail polish on your fingernails.  Do not shave  48 hours prior to surgery.     Do not bring valuables to the hospital. [facility] IS NOT             RESPONSIBLE   FOR VALUABLES.  Contacts, dentures or bridgework may not be worn into surgery.       Patients discharged the day of surgery will not be allowed to drive home.  IF YOU ARE HAVING SURGERY AND GOING HOME THE SAME DAY, YOU MUST HAVE AN ADULT TO DRIVE YOU HOME AND BE WITH YOU FOR 24 HOURS.  YOU MAY GO HOME BY TAXI OR UBER OR ORTHERWISE, BUT AN ADULT MUST ACCOMPANY YOU HOME AND STAY WITH YOU FOR 24 HOURS.  Name and phone number of your driver:  Special Instructions: N/A              Please read over the following fact sheets you were given: _____________________________________________________________________             [facility] - Preparing for Surgery Before surgery, you can play an important role.   Because skin is not sterile, your skin needs to be as free of germs as possible .  You can reduce the number of germs on your skin by washing with CHG (chlorahexidine gluconate) soap before surgery.   CHG is an antiseptic cleaner which kills germs and bonds with the skin to continue killing germs even after washing. Please DO NOT use if you have an allergy to CHG or antibacterial soaps.   If your skin becomes reddened/irritated stop using the CHG and inform your nurse when you arrive at Short Stay. Do not shave (including legs and underarms) for at least 48 hours prior to the first CHG shower.    Please follow these instructions carefully:  1.  Shower with CHG Soap the night before surgery and the  morning of Surgery.  2.  If you choose to wash your hair, wash your hair first as usual with your  normal  shampoo.  3.  After you shampoo, rinse your hair  and body thoroughly to remove the  shampoo.                                        4.  Use CHG as you would any other liquid soap.  You can apply chg directly  to the skin and wash                       Gently with a scrungie or clean washcloth.  5.  Apply the CHG Soap to your body ONLY FROM THE NECK DOWN.   Do not use on face/ open                           Wound or open sores. Avoid contact with eyes, ears mouth and genitals (private parts).                       Wash  face,  Genitals (private parts) with your normal soap.             6.  Wash thoroughly, paying special attention to the area where your surgery  will be performed.  7.  Thoroughly rinse your body with warm water from the neck down.  8.  DO NOT shower/wash with your normal soap after using and rinsing off  the CHG Soap.             9.  Pat yourself dry with a clean towel.            10.  Wear clean pajamas.            11.  Place clean sheets on your bed the night of your first shower and do not  sleep with pets. Day of Surgery : Do not apply any lotions/deodorants the morning of surgery.  Please wear clean clothes to the hospital/surgery center.  FAILURE TO FOLLOW THESE INSTRUCTIONS MAY RESULT IN THE CANCELLATION OF YOUR SURGERY PATIENT SIGNATURE_________________________________  NURSE SIGNATURE__________________________________  ________________________________________________________________________   Vanessa [name]  An incentive spirometer is a tool that can help keep your lungs clear and active. This tool measures how well you are filling your lungs with each breath. Taking long deep breaths may help reverse or decrease the chance of developing breathing (pulmonary) problems (especially infection) following:  A long period of time when you are unable to move or be active. BEFORE THE PROCEDURE   If the spirometer includes an indicator to show your best effort, your nurse or respiratory therapist will set it to a  desired goal.  If possible, sit up straight or lean slightly forward. Try not to slouch.  Hold the incentive spirometer in an upright position. INSTRUCTIONS FOR USE  1. Sit on the edge of your bed if possible, or sit up as far as you can in bed or on a chair. 2. Hold the incentive spirometer in an upright position. 3. Breathe out normally. 4. Place the mouthpiece in your mouth and seal your lips tightly around it. 5. Breathe in slowly and as deeply as possible, raising the piston or the ball toward the top of the column. 6. Hold your breath for 3-5 seconds or for as long as possible. Allow the piston or ball to fall to the bottom of the column. 7. Remove the mouthpiece from your mouth and breathe out normally. 8. Rest for a few seconds and repeat Steps 1 through 7 at least 10 times every 1-2 hours when you are awake. Take your time and take a few normal breaths between deep breaths. 9. The spirometer may include an indicator to show your best effort. Use the indicator as a goal to work toward during each repetition. 10. After each set of 10 deep breaths, practice coughing to be sure your lungs are clear. If you have an incision (the cut made at the time of surgery), support your incision when coughing by placing a pillow or rolled up towels firmly against it. Once you are able to get out of bed, walk around indoors and cough well. You may stop using the incentive spirometer when instructed by your caregiver.  RISKS AND COMPLICATIONS  Take your time so you do not get dizzy or light-headed.  If you are in pain, you may need to take or ask for pain medication before doing incentive spirometry. It is harder to take a deep breath if you are having pain. AFTER USE  Rest  and breathe slowly and easily.  It can be helpful to keep track of a log of your progress. Your caregiver can provide you with a simple table to help with this. If you are using the spirometer at home, follow these  instructions: [place] IF:   You are having difficultly using the spirometer.  You have trouble using the spirometer as often as instructed.  Your pain medication is not giving enough relief while using the spirometer.  You develop fever of 100.5 F (38.1 C) or higher. SEEK IMMEDIATE MEDICAL CARE IF:   You cough up bloody sputum that had not been present before.  You develop fever of 102 F (38.9 C) or greater.  You develop worsening pain at or near the incision site. MAKE SURE YOU:   Understand these instructions.  Will watch your condition.  Will get help right away if you are not doing well or get worse. Document Released: 09/21/2006 Document Revised: 08/03/2011 Document Reviewed: 11/22/2006 [facility] Patient Information 2014 [place], [place].   ________________________________________________________________________

## 2019-11-22 ENCOUNTER — Encounter (HOSPITAL_COMMUNITY)
Admission: RE | Admit: 2019-11-22 | Discharge: 2019-11-22 | Disposition: A | Discharge status: Home / Self Care | Payer: Commercial Managed Care - PPO | Source: Ambulatory Visit | Attending: Family Medicine | Admitting: Family Medicine

## 2019-12-01 ENCOUNTER — Ambulatory Visit: Admit: 2019-12-01 | Payer: Commercial Managed Care - PPO | Admitting: Orthopedic Surgery

## 2019-12-01 SURGERY — ARTHROPLASTY, KNEE, TOTAL
Anesthesia: Spinal | Site: Knee | Laterality: Right

## 2019-12-26 ENCOUNTER — Other Ambulatory Visit: Payer: Self-pay | Admitting: Family Medicine

## 2019-12-27 NOTE — Telephone Encounter (Signed)
Last OV 10/17/19 Alprazolam last filled 10/25/19 #60 with 0

## 2020-01-23 ENCOUNTER — Other Ambulatory Visit: Payer: Self-pay

## 2020-01-23 ENCOUNTER — Emergency Department (HOSPITAL_COMMUNITY): Payer: Commercial Managed Care - PPO

## 2020-01-23 ENCOUNTER — Encounter (HOSPITAL_COMMUNITY): Payer: Self-pay

## 2020-01-23 ENCOUNTER — Emergency Department (HOSPITAL_COMMUNITY)
Admission: EM | Admit: 2020-01-23 | Discharge: 2020-01-23 | Disposition: A | Discharge status: Home / Self Care | Payer: Commercial Managed Care - PPO | Attending: Emergency Medicine | Admitting: Emergency Medicine

## 2020-01-23 DIAGNOSIS — M25561 Pain in right knee: Secondary | ICD-10-CM | POA: Insufficient documentation

## 2020-01-23 DIAGNOSIS — Z79899 Other long term (current) drug therapy: Secondary | ICD-10-CM | POA: Diagnosis not present

## 2020-01-23 DIAGNOSIS — I10 Essential (primary) hypertension: Secondary | ICD-10-CM | POA: Insufficient documentation

## 2020-01-23 DIAGNOSIS — G8929 Other chronic pain: Secondary | ICD-10-CM | POA: Diagnosis not present

## 2020-01-23 DIAGNOSIS — Z87891 Personal history of nicotine dependence: Secondary | ICD-10-CM | POA: Insufficient documentation

## 2020-01-23 MED ORDER — PREDNISONE 10 MG (21) PO TBPK
ORAL_TABLET | Freq: Every day | ORAL | 0 refills | Status: DC
Start: 2020-01-23 — End: 2020-03-20

## 2020-01-23 NOTE — ED Triage Notes (Signed)
Patient c/o right knee pain and swelling. Patient states "I was suppose to have a knee replacement last month." patient states her job requires her to be on her feet.

## 2020-01-23 NOTE — Discharge Instructions (Addendum)
Try the prednisone taper for your knee pain. Do not take ibuprofen while taking this. Follow-up with Dr. Luiz Blare

## 2020-01-23 NOTE — ED Provider Notes (Signed)
[facility]-EMERGENCY DEPT Provider Note   CSN: [id number] Arrival date & time: 01/23/20  1032     History Chief Complaint  Patient presents with  . Knee Pain    Vanessa [name] is a 59 y.o. female.  59 year old female presents with worsening chronic right knee pain. Patient is scheduled to have a knee replacement in near future. States that her pain is sharp and worse with standing. Denies any recent fever or chills. No distal numbness or tingling to her right foot. Denies any hip discomfort. Pain better with rest. Has been medicating with ibuprofen        Past Medical History:  Diagnosis Date  . Anxiety   . Arthritis   . Chronic knee pain   . Depression   . Diverticulosis   . Gall stone   . Hepatitis C   . Hypertension   . Syphilis     Patient Active Problem List   Diagnosis Date Noted  . Incontinence of feces   . Obesity (BMI 30-39.9) 07/12/2018  . Allergic rhinitis 07/22/2017  . Tear of medial meniscus of right knee, current 03/03/2017  . Tear of lateral meniscus of right knee, current 03/03/2017  . Vitamin D deficiency 12/04/2016  . Baker's cyst of knee, right 05/21/2016  . Edema 12/03/2015  . Liver fibrosis 07/25/2015  . S/P laparoscopic cholecystectomy 11/25/2014  . Knee pain, right 07/17/2014  . Physical exam 07/17/2014  . Pap smear for cervical cancer screening 07/17/2014  . Hyperlipidemia 01/12/2014  . History of hepatitis C 04/27/2013  . 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 Hypertension   . Depression     Past Surgical History:  Procedure Laterality Date  . ANAL RECTAL MANOMETRY N/A 02/20/2019   Procedure: ANO RECTAL MANOMETRY;  Surgeon: Rachael Fee, MD;  Location: [facility];  Service: Endoscopy;  Laterality: N/A;  . CARPAL TUNNEL RELEASE Left   . CHOLECYSTECTOMY N/A 11/24/2014   Procedure: LAPAROSCOPIC CHOLECYSTECTOMY WITH INTRAOPERATIVE CHOLANGIOGRAM;  Surgeon: Gaynelle Adu, MD;  Location: [facility] ORS;   Service: General;  Laterality: N/A;  . ECTOPIC PREGNANCY SURGERY       OB History   No obstetric history on file.     Family History  Problem Relation Age of Onset  . Hypertension Mother   . Diabetes Mother   . Drug abuse Brother   . Esophageal cancer Brother 78  . Colon cancer Brother 72  . Hypertension Sister        x's 2  . Diabetes Sister        oldest sister  . Stomach cancer Neg Hx   . Breast cancer Neg Hx   . Rectal cancer Neg Hx     Social History   Tobacco Use  . Smoking status: Former Smoker    Packs/day: 1.00    Years: 35.00    Pack years: 35.00    Types: E-cigarettes    Start date: 05/25/1994    Quit date: 03/03/2016    Years since quitting: 3.8  . Smokeless tobacco: Never Used  Vaping Use  . Vaping Use: Every day  . Substances: Nicotine, Flavoring  Substance Use Topics  . Alcohol use: No    Alcohol/week: 0.0 standard drinks    Comment: "last alcohol was in the 1990's"  . Drug use: Not Currently    Types: Marijuana, "Crack" cocaine    Comment: "last drug use was in the 1990's; never used heroin"    Home Medications Prior to Admission  medications   Medication Sig Start Date End Date Taking? Authorizing Provider  ALPRAZolam (XANAX) 0.5 MG tablet TAKE 1 TABLET (0.5 MG TOTAL) BY MOUTH 2 (TWO) TIMES DAILY AS NEEDED FOR ANXIETY. 12/27/19   Sheliah Hatch, MD  ascorbic acid (VITAMIN C) 1000 MG tablet Take 1,000 mg by mouth daily.  01/30/16   [provider]  benazepril-hydrochlorthiazide (LOTENSIN HCT) 10-12.5 MG tablet TAKE 1 TABLET BY MOUTH EVERY DAY Patient taking differently: Take 1 tablet by mouth daily.  08/31/19   Sheliah Hatch, MD  fluticasone (FLONASE) 50 MCG/ACT nasal spray Place 2 sprays into both nostrils daily. Patient taking differently: Place 2 sprays into both nostrils daily as needed for allergies.  01/11/18   Waldon Merl, PA-C  ibuprofen (ADVIL) 800 MG tablet TAKE 1 TABLET BY MOUTH THREE TIMES A DAY AS NEEDED Patient  taking differently: Take 800 mg by mouth every 8 (eight) hours as needed for moderate pain.  11/06/19   Sheliah Hatch, MD  Multiple Vitamin (MULTIVITAMIN WITH MINERALS) TABS tablet Take 1 tablet by mouth daily.    [provider]  SUBOXONE 8-2 MG FILM Place 0.5 strips under the tongue 2 (two) times daily. 04/18/13   [provider]  venlafaxine XR (EFFEXOR-XR) 150 MG 24 hr capsule TAKE 1 CAPSULE (150 MG TOTAL) BY MOUTH DAILY WITH BREAKFAST. 11/20/19   Sheliah Hatch, MD  venlafaxine XR (EFFEXOR-XR) 75 MG 24 hr capsule TAKE 1 CAPSULE DAILY WITH BREAKFAST. TAKE IN COMBINATION WITH 150MG  FOR A TOTAL OF 225MG  DAILY 11/20/19   , MD  Vitamin D, Ergocalciferol, (DRISDOL) 1.25 MG (50000 UNIT) CAPS capsule Take 1 capsule (50,000 Units total) by mouth every 7 (seven) days. 10/18/19   Sheliah Hatch, MD    Allergies    Patient has no known allergies.  Review of Systems   Review of Systems  All other systems reviewed and are negative.   Physical Exam Updated Vital Signs BP (!) 126/99 (BP Location: Right Arm)   Pulse 92   Temp 98.1 F (36.7 C) (Oral)   Resp 16   Ht 1.549 m (5\' 1" )   Wt 77.1 kg   SpO2 96%   BMI 32.12 kg/m   Physical Exam Vitals and nursing note reviewed.  Constitutional:      General: She is not in acute distress.    Appearance: Normal appearance. She is well-developed. She is not toxic-appearing.  HENT:     Head: Normocephalic and atraumatic.  Eyes:     General: Lids are normal.     Conjunctiva/sclera: Conjunctivae normal.     Pupils: Pupils are equal, round, and reactive to light.  Neck:     Thyroid: No thyroid mass.     Trachea: No tracheal deviation.  Cardiovascular:     Rate and Rhythm: Normal rate and regular rhythm.     Heart sounds: Normal heart sounds. No murmur heard.  No gallop.   Pulmonary:     Effort: Pulmonary effort is normal. No respiratory distress.     Breath sounds: Normal breath sounds. No  stridor. No decreased breath sounds, wheezing, rhonchi or rales.  Abdominal:     General: Bowel sounds are normal. There is no distension.     Palpations: Abdomen is soft.     Tenderness: There is no abdominal tenderness. There is no rebound.  Musculoskeletal:        General: No tenderness. Normal range of motion.     Cervical back:  Normal range of motion and neck supple.     Right knee: Swelling and effusion present. No erythema or ecchymosis. Normal range of motion.  Skin:    General: Skin is warm and dry.     Findings: No abrasion or rash.  Neurological:     Mental Status: She is alert and oriented to person, place, and time.     GCS: GCS eye subscore is 4. GCS verbal subscore is 5. GCS motor subscore is 6.     Cranial Nerves: No cranial nerve deficit.     Sensory: No sensory deficit.  Psychiatric:        Speech: Speech normal.        Behavior: Behavior normal.     ED Results / Procedures / Treatments   Labs (all labs ordered are listed, but only abnormal results are displayed) Labs Reviewed - No data to display  EKG None  Radiology DG Knee Complete 4 Views Right  Result Date: 01/23/2020 CLINICAL DATA:  Pain. EXAM: RIGHT KNEE - COMPLETE 4+ VIEW COMPARISON:  06/22/2016 knee radiographs. FINDINGS: Tricompartmental degenerative changes including subchondral sclerosis, marginal osteophytosis and joint space loss predominantly involving the medial compartment. Physiologic alignment. No fracture. Small joint effusion. IMPRESSION: Medial predominant osteoarthrosis. No acute osseous abnormality.  Small joint effusion. Electronically Signed   By: Stana Bunting M.D.   On: 01/23/2020 11:22    Procedures Procedures (including critical care time)  Medications Ordered in ED Medications - No data to display  ED Course  I have reviewed the triage vital signs and the nursing notes.  Pertinent labs & imaging results that were available during my care of the patient were reviewed  by me and considered in my medical decision making (see chart for details).    MDM Rules/Calculators/A&P                         X-ray of knee without acute injury and only a small joint effusion. Suspect patient has worsening osteoarthritis and will try a course of steroids. And patient encouraged to follow with her doctor. Final Clinical Impression(s) / ED Diagnoses Final diagnoses:  None    Rx / DC Orders ED Discharge Orders    None       Lorre Nick, MD 01/23/20 1132

## 2020-02-12 ENCOUNTER — Other Ambulatory Visit: Payer: Self-pay | Admitting: Family Medicine

## 2020-02-12 NOTE — Telephone Encounter (Signed)
Last OV 10/17/19 Alprazolam last filled 12/27/19 #60 with 0

## 2020-02-26 ENCOUNTER — Encounter (HOSPITAL_COMMUNITY): Admission: RE | Admit: 2020-02-26 | Payer: Commercial Managed Care - PPO | Source: Ambulatory Visit

## 2020-03-06 ENCOUNTER — Encounter (HOSPITAL_COMMUNITY): Admission: RE | Admit: 2020-03-06 | Payer: Commercial Managed Care - PPO | Source: Ambulatory Visit

## 2020-03-06 ENCOUNTER — Other Ambulatory Visit: Payer: Self-pay | Admitting: Orthopedic Surgery

## 2020-03-08 NOTE — Progress Notes (Signed)
DUE TO COVID-19 ONLY ONE VISITOR IS ALLOWED TO COME WITH YOU AND STAY IN THE WAITING ROOM ONLY DURING PRE OP AND PROCEDURE DAY OF SURGERY. THE 1 VISITOR  MAY VISIT WITH YOU AFTER SURGERY IN YOUR PRIVATE ROOM DURING VISITING HOURS ONLY!  YOU NEED TO HAVE A COVID 19 TEST ON_10/19/2021 ______ @_______ , THIS TEST MUST BE DONE BEFORE SURGERY,  COVID TESTING SITE [address] [place] , IT IS ON THE RIGHT GOING OUT [address] APPROXIMATELY  2 MINUTES PAST [employer] ON THE RIGHT. ONCE YOUR COVID TEST IS COMPLETED,  PLEASE BEGIN THE QUARANTINE INSTRUCTIONS AS OUTLINED IN YOUR HANDOUT.                Vanessa [name]  03/08/2020   Your procedure is scheduled on: 03/15/2020    Report to [facility] Main  Entrance   Report to admitting at   0800am AM     Call this number if you have problems the morning of surgery [phone]    REMEMBER: NO  SOLID FOOD CANDY OR GUM AFTER MIDNIGHT. CLEAR LIQUIDS UNTIL   0730am      . NOTHING BY MOUTH EXCEPT CLEAR LIQUIDS UNTIL    . PLEASE FINISH ENSURE DRINK PER SURGEON ORDER  WHICH NEEDS TO BE COMPLETED AT   0730am   .      CLEAR LIQUID DIET   Foods Allowed                                                                    Coffee and tea, regular and decaf                            Fruit ices (not with fruit pulp)                                      Iced Popsicles                                    Carbonated beverages, regular and diet                                    Cranberry, grape and apple juices Sports drinks like Gatorade Lightly seasoned clear broth or consume(fat free) Sugar, honey syrup ___________________________________________________________________      BRUSH YOUR TEETH MORNING OF SURGERY AND RINSE YOUR MOUTH OUT, NO CHEWING GUM CANDY OR MINTS.     Take these medicines the morning of surgery with A SIP OF WATER:  Effexor  DO NOT TAKE ANY DIABETIC MEDICATIONS DAY OF YOUR SURGERY                                You may not have any metal on your body including hair pins and              piercings  Do not wear jewelry, make-up, lotions,  powders or perfumes, deodorant             Do not wear nail polish on your fingernails.  Do not shave  48 hours prior to surgery.              Men may shave face and neck.   Do not bring valuables to the hospital. [place].  Contacts, dentures or bridgework may not be worn into surgery.  Leave suitcase in the car. After surgery it may be brought to your room.     Patients discharged the day of surgery will not be allowed to drive home. IF YOU ARE HAVING SURGERY AND GOING HOME THE SAME DAY, YOU MUST HAVE AN ADULT TO DRIVE YOU HOME AND BE WITH YOU FOR 24 HOURS. YOU MAY GO HOME BY TAXI OR UBER OR ORTHERWISE, BUT AN ADULT MUST ACCOMPANY YOU HOME AND STAY WITH YOU FOR 24 HOURS.  Name and phone number of your driver:  Special Instructions: N/A              Please read over the following fact sheets you were given: _____________________________________________________________________  [facility] - Preparing for Surgery Before surgery, you can play an important role.  Because skin is not sterile, your skin needs to be as free of germs as possible.  You can reduce the number of germs on your skin by washing with CHG (chlorahexidine gluconate) soap before surgery.  CHG is an antiseptic cleaner which kills germs and bonds with the skin to continue killing germs even after washing. Please DO NOT use if you have an allergy to CHG or antibacterial soaps.  If your skin becomes reddened/irritated stop using the CHG and inform your nurse when you arrive at Short Stay. Do not shave (including legs and underarms) for at least 48 hours prior to the first CHG shower.  You may shave your face/neck. Please follow these instructions carefully:  1.  Shower with CHG Soap the night before surgery and the  morning of  Surgery.  2.  If you choose to wash your hair, wash your hair first as usual with your  normal  shampoo.  3.  After you shampoo, rinse your hair and body thoroughly to remove the  shampoo.                           4.  Use CHG as you would any other liquid soap.  You can apply chg directly  to the skin and wash                       Gently with a scrungie or clean washcloth.  5.  Apply the CHG Soap to your body ONLY FROM THE NECK DOWN.   Do not use on face/ open                           Wound or open sores. Avoid contact with eyes, ears mouth and genitals (private parts).                       Wash face,  Genitals (private parts) with your normal soap.             6.  Wash thoroughly, paying special attention to the  area where your surgery  will be performed.  7.  Thoroughly rinse your body with warm water from the neck down.  8.  DO NOT shower/wash with your normal soap after using and rinsing off  the CHG Soap.                9.  Pat yourself dry with a clean towel.            10.  Wear clean pajamas.            11.  Place clean sheets on your bed the night of your first shower and do not  sleep with pets. Day of Surgery : Do not apply any lotions/deodorants the morning of surgery.  Please wear clean clothes to the hospital/surgery center.  FAILURE TO FOLLOW THESE INSTRUCTIONS MAY RESULT IN THE CANCELLATION OF YOUR SURGERY PATIENT SIGNATURE_________________________________  NURSE SIGNATURE__________________________________  ________________________________________________________________________

## 2020-03-12 ENCOUNTER — Other Ambulatory Visit: Payer: Self-pay

## 2020-03-12 ENCOUNTER — Encounter (HOSPITAL_COMMUNITY)
Admission: RE | Admit: 2020-03-12 | Discharge: 2020-03-12 | Disposition: A | Discharge status: Home / Self Care | Payer: Commercial Managed Care - PPO | Source: Ambulatory Visit | Attending: Orthopedic Surgery | Admitting: Orthopedic Surgery

## 2020-03-12 ENCOUNTER — Ambulatory Visit (HOSPITAL_COMMUNITY)
Admission: RE | Admit: 2020-03-12 | Discharge: 2020-03-12 | Disposition: A | Discharge status: Home / Self Care | Payer: Commercial Managed Care - PPO | Source: Ambulatory Visit | Attending: Orthopedic Surgery | Admitting: Orthopedic Surgery

## 2020-03-12 ENCOUNTER — Encounter (HOSPITAL_COMMUNITY): Payer: Self-pay

## 2020-03-12 DIAGNOSIS — Z01811 Encounter for preprocedural respiratory examination: Secondary | ICD-10-CM

## 2020-03-12 LAB — CBC WITH DIFFERENTIAL/PLATELET
Abs Immature Granulocytes: 0.01 10*3/uL (ref 0.00–0.07)
Basophils Absolute: 0 10*3/uL (ref 0.0–0.1)
Basophils Relative: 0 %
Eosinophils Absolute: 0.1 10*3/uL (ref 0.0–0.5)
Eosinophils Relative: 1 %
HCT: 39.2 % (ref 36.0–46.0)
Hemoglobin: 12.4 g/dL (ref 12.0–15.0)
Immature Granulocytes: 0 %
Lymphocytes Relative: 34 %
Lymphs Abs: 2.4 10*3/uL (ref 0.7–4.0)
MCH: 28.5 pg (ref 26.0–34.0)
MCHC: 31.6 g/dL (ref 30.0–36.0)
MCV: 90.1 fL (ref 80.0–100.0)
Monocytes Absolute: 0.4 10*3/uL (ref 0.1–1.0)
Monocytes Relative: 5 %
Neutro Abs: 4.3 10*3/uL (ref 1.7–7.7)
Neutrophils Relative %: 60 %
Platelets: 255 10*3/uL (ref 150–400)
RBC: 4.35 MIL/uL (ref 3.87–5.11)
RDW: 14.3 % (ref 11.5–15.5)
WBC: 7.2 10*3/uL (ref 4.0–10.5)
nRBC: 0 % (ref 0.0–0.2)

## 2020-03-12 LAB — COMPREHENSIVE METABOLIC PANEL
ALT: 19 U/L (ref 0–44)
AST: 19 U/L (ref 15–41)
Albumin: 3.8 g/dL (ref 3.5–5.0)
Alkaline Phosphatase: 45 U/L (ref 38–126)
Anion gap: 10 (ref 5–15)
BUN: 20 mg/dL (ref 6–20)
CO2: 27 mmol/L (ref 22–32)
Calcium: 9 mg/dL (ref 8.9–10.3)
Chloride: 103 mmol/L (ref 98–111)
Creatinine, Ser: 0.7 mg/dL (ref 0.44–1.00)
GFR, Estimated: >60 mL/min (ref >60)
Glucose, Bld: 86 mg/dL (ref 70–99)
Potassium: 4 mmol/L (ref 3.5–5.1)
Sodium: 140 mmol/L (ref 135–145)
Total Bilirubin: 0.4 mg/dL (ref 0.3–1.2)
Total Protein: 7.3 g/dL (ref 6.5–8.1)

## 2020-03-12 LAB — URINALYSIS, ROUTINE W REFLEX MICROSCOPIC
Bilirubin Urine: NEGATIVE
Glucose, UA: NEGATIVE mg/dL
Hgb urine dipstick: NEGATIVE
Ketones, ur: NEGATIVE mg/dL
Leukocytes,Ua: NEGATIVE
Nitrite: NEGATIVE
Protein, ur: NEGATIVE mg/dL
Specific Gravity, Urine: 1.014 (ref 1.005–1.030)
pH: 5 (ref 5.0–8.0)

## 2020-03-12 LAB — PROTIME-INR
INR: 1.1 (ref 0.8–1.2)
Prothrombin Time: 13.6 seconds (ref 11.4–15.2)

## 2020-03-12 LAB — SURGICAL PCR SCREEN
MRSA, PCR: NEGATIVE
Staphylococcus aureus: NEGATIVE

## 2020-03-12 LAB — APTT: aPTT: 34 seconds (ref 24–36)

## 2020-03-12 NOTE — Progress Notes (Addendum)
COVID Vaccine Completed:  x2 Date COVID Vaccine completed:  09-14-19 & 10-12-19 COVID vaccine manufacturer: Pfizer    Moderna   [employer]   PCP - Neena Rhymes, MD Cardiologist -   Last dose of Suboxone 03-12-20,  Pt advised to not take anymore prior to surgery  Chest x-ray - 03-12-20 EKG - 11-15-19 in Epic  Stress Test -  ECHO - 01-06-2014 in Epic Cardiac Cath -  Pacemaker/ICD device last checked:  Sleep Study -  CPAP -   Fasting Blood Sugar -  Checks Blood Sugar _____ times a day  Blood Thinner Instructions: Aspirin Instructions: Last Dose:  Anesthesia review:     Patient denies shortness of breath, fever, cough and chest pain at PAT appointment   Patient verbalized understanding of instructions that were given to them at the PAT appointment. Patient was also instructed that they will need to review over the PAT instructions again at home before surgery.

## 2020-03-12 NOTE — Patient Instructions (Addendum)
DUE TO COVID-19 ONLY ONE VISITOR IS ALLOWED TO COME WITH YOU AND STAY IN THE WAITING ROOM ONLY DURING PRE OP AND PROCEDURE DAY OF SURGERY. THE 1 VISITOR  MAY VISIT WITH YOU AFTER SURGERY IN YOUR PRIVATE ROOM DURING VISITING HOURS ONLY!  YOU NEED TO HAVE A COVID 19 TEST ON 03/13/2020  @ 9:15 AM,  THIS TEST MUST BE DONE BEFORE SURGERY,  COVID TESTING SITE [address] [place] [address], IT IS ON THE RIGHT GOING OUT [address] APPROXIMATELY  2 MINUTES PAST [employer] ON THE RIGHT. ONCE YOUR COVID TEST IS COMPLETED,  PLEASE BEGIN THE QUARANTINE INSTRUCTIONS AS OUTLINED IN YOUR HANDOUT.                                   Your procedure is scheduled on: 03/15/2020               Report to [facility] Main  Entrance    Report to admitting at   08:00 AM               Call this number if you have problems the morning of surgery [phone]                 REMEMBER: NO  SOLID FOOD CANDY OR GUM AFTER MIDNIGHT.    YOU MAY HAVE CLEAR LIQUIDS UNTIL  7:30 am.    PLEASE FINISH ENSURE DRINK PER SURGEON'S ORDER AT  7:30 am.      CLEAR LIQUID DIET   Foods Allowed                                                                    Coffee and tea, regular and decaf                            Fruit ices (not with fruit pulp)                                      Iced Popsicles                                                              Carbonated beverages, regular and diet                                    Cranberry, grape and apple juices Sports drinks like Gatorade Lightly seasoned clear broth or consume(fat free) Sugar, honey syrup ___________________________________________________________________      BRUSH YOUR TEETH MORNING OF SURGERY AND RINSE YOUR MOUTH OUT, NO CHEWING GUM CANDY OR MINTS.                           Take these medicines the morning of surgery  with A SIP OF WATER:   Effexor   Do not take Suboxone.                                 You may not have any metal on your body including hair pins and              piercings  Do not wear jewelry, make-up, lotions, powders or perfumes, deodorant             Do not wear nail polish on your fingernails.  Do not shave  48 hours prior to surgery.                         Do not bring valuables to the hospital. [place] IS NOT RESPONSIBLE   FOR VALUABLES.               Contacts, dentures or bridgework may not be worn into surgery.                                       PATIENTS DISCHARGED THE DAY OF SURGERY WILL NOT BE ALLOWED TO DRIVE HOME.  IF YOU ARE HAVING SURGERY AND GOING HOME  THE SAME DAY,  YOU MUST HAVE AN ADULT TO DRIVE YOU HOME AND BE WITH YOU FOR 24 HOURS. YOU MAY GO HOME BY TAXI OR  UBER OR OTHERWISE, BUT AN ADULT MUST ACCOMPANY YOU HOME AND STAY WITH YOU FOR 24 HOURS.                        Please read over the following fact sheets you were given: _____________________________________________________________________  [facility] - Preparing for [facility] Before surgery, you can play an important role.  Because skin is not sterile, your skin needs to be as free of germs as possible.  You can reduce the number of germs on your skin by washing with CHG (chlorahexidine gluconate) soap before surgery.  CHG is an antiseptic cleaner which kills germs and bonds with the skin to continue killing germs even after washing. Please DO NOT use if you have an allergy to CHG or antibacterial soaps.  If your skin becomes reddened/irritated stop using the CHG and inform your nurse when you arrive at Short Stay. Do not shave (including legs and underarms) for at least 48 hours prior to the first CHG shower.  You may shave your face/neck. Please follow these instructions carefully:             1.  Shower with CHG Soap the night before surgery and the  morning of Surgery.             2.  If you choose to wash your hair, wash your hair first as usual with your  normal   shampoo.             3.  After you shampoo, rinse your hair and body thoroughly to remove the  shampoo.                               4.  Use CHG as you would any other liquid soap.  You can apply chg directly  to the skin and wash  Gently with a scrungie or clean washcloth.             5.  Apply the CHG Soap to your body ONLY FROM THE NECK DOWN.   Do not use on face/ open                           Wound or open sores. Avoid contact with eyes, ears mouth and genitals (private parts).                       Wash face,  Genitals (private parts) with your normal soap.             6.  Wash thoroughly, paying special attention to the area where your surgery  will be performed.             7.  Thoroughly rinse your body with warm water from the neck down.             8.  DO NOT shower/wash with your normal soap after using and rinsing off  the CHG Soap.                9.  Pat yourself dry with a clean towel.            10.  Wear clean pajamas.            11.  Place clean sheets on your bed the night of your first shower and do not  sleep with pets. Day of Surgery : Do not apply any lotions/deodorants the morning of surgery.  Please wear clean clothes to the hospital/surgery center.  FAILURE TO FOLLOW THESE INSTRUCTIONS MAY RESULT IN THE CANCELLATION OF YOUR SURGERY PATIENT SIGNATURE_________________________________  NURSE SIGNATURE__________________________________  ________________________________________________________________________    Rogelia Mire  An incentive spirometer is a tool that can help keep your lungs clear and active. This tool measures how well you are filling your lungs with each breath. Taking long deep breaths may help reverse or decrease the chance of developing breathing (pulmonary) problems (especially infection) following:  A long period of time when you are unable to move or be active. BEFORE THE PROCEDURE   If the spirometer includes  an indicator to show your best effort, your nurse or respiratory therapist will set it to a desired goal.  If possible, sit up straight or lean slightly forward. Try not to slouch.  Hold the incentive spirometer in an upright position. INSTRUCTIONS FOR USE  1. Sit on the edge of your bed if possible, or sit up as far as you can in bed or on a chair. 2. Hold the incentive spirometer in an upright position. 3. Breathe out normally. 4. Place the mouthpiece in your mouth and seal your lips tightly around it. 5. Breathe in slowly and as deeply as possible, raising the piston or the ball toward the top of the column. 6. Hold your breath for 3-5 seconds or for as long as possible. Allow the piston or ball to fall to the bottom of the column. 7. Remove the mouthpiece from your mouth and breathe out normally. 8. Rest for a few seconds and repeat Steps 1 through 7 at least 10 times every 1-2 hours when you are awake. Take your time and take a few normal breaths between deep breaths. 9. The spirometer may include an indicator to show your best effort. Use the indicator as a goal to work  toward during each repetition. 10. After each set of 10 deep breaths, practice coughing to be sure your lungs are clear. If you have an incision (the cut made at the time of surgery), support your incision when coughing by placing a pillow or rolled up towels firmly against it. Once you are able to get out of bed, walk around indoors and cough well. You may stop using the incentive spirometer when instructed by your caregiver.  RISKS AND COMPLICATIONS  Take your time so you do not get dizzy or light-headed.  If you are in pain, you may need to take or ask for pain medication before doing incentive spirometry. It is harder to take a deep breath if you are having pain. AFTER USE  Rest and breathe slowly and easily.  It can be helpful to keep track of a log of your progress. Your caregiver can provide you with a simple  table to help with this. If you are using the spirometer at home, follow these instructions: SEEK MEDICAL CARE IF:   You are having difficultly using the spirometer.  You have trouble using the spirometer as often as instructed.  Your pain medication is not giving enough relief while using the spirometer.  You develop fever of 100.5 F (38.1 C) or higher. SEEK IMMEDIATE MEDICAL CARE IF:   You cough up bloody sputum that had not been present before.  You develop fever of 102 F (38.9 C) or greater.  You develop worsening pain at or near the incision site. MAKE SURE YOU:   Understand these instructions.  Will watch your condition.  Will get help right away if you are not doing well or get worse. Document Released: 09/21/2006 Document Revised: 08/03/2011 Document Reviewed: 11/22/2006 [facility] Patient Information 2014 [place], [place].   ________________________________________________________________________

## 2020-03-13 ENCOUNTER — Other Ambulatory Visit (HOSPITAL_COMMUNITY)
Admission: RE | Admit: 2020-03-13 | Discharge: 2020-03-13 | Disposition: A | Discharge status: Home / Self Care | Payer: Commercial Managed Care - PPO | Source: Ambulatory Visit | Attending: Orthopedic Surgery | Admitting: Orthopedic Surgery

## 2020-03-13 DIAGNOSIS — Z01812 Encounter for preprocedural laboratory examination: Secondary | ICD-10-CM | POA: Insufficient documentation

## 2020-03-13 DIAGNOSIS — Z20822 Contact with and (suspected) exposure to covid-19: Secondary | ICD-10-CM | POA: Insufficient documentation

## 2020-03-13 LAB — SARS CORONAVIRUS 2 (TAT 6-24 HRS): SARS Coronavirus 2: NEGATIVE

## 2020-03-14 MED ORDER — BUPIVACAINE LIPOSOME 1.3 % IJ SUSP
20.0000 mL | Freq: Once | INTRAMUSCULAR | Status: DC
  Filled 2020-03-14: qty 20

## 2020-03-14 NOTE — Anesthesia Preprocedure Evaluation (Addendum)
Anesthesia Evaluation  Patient identified by MRN, date of birth, ID band Patient awake    Reviewed: Allergy & Precautions, NPO status , Patient's Chart, lab work & pertinent test results  Airway Mallampati: II  TM Distance: >3 FB     Dental   Pulmonary former smoker,    breath sounds clear to auscultation       Cardiovascular hypertension,  Rhythm:Regular Rate:Normal     Neuro/Psych PSYCHIATRIC DISORDERS Anxiety Depression    GI/Hepatic negative GI ROS, (+) Hepatitis -  Endo/Other  negative endocrine ROS  Renal/GU negative Renal ROS     Musculoskeletal  (+) Arthritis ,   Abdominal   Peds  Hematology   Anesthesia Other Findings   Reproductive/Obstetrics                            Anesthesia Physical Anesthesia Plan  ASA: III  Anesthesia Plan: General   Post-op Pain Management:  Regional for Post-op pain   Induction: Intravenous  PONV Risk Score and Plan: 3 and Ondansetron, Dexamethasone and Midazolam  Airway Management Planned: Oral ETT  Additional Equipment:   Intra-op Plan:   Post-operative Plan: Extubation in OR  Informed Consent: I have reviewed the patients History and Physical, chart, labs and discussed the procedure including the risks, benefits and alternatives for the proposed anesthesia with the patient or authorized representative who has indicated his/her understanding and acceptance.     Dental advisory given  Plan Discussed with: CRNA and Anesthesiologist  Anesthesia Plan Comments:        Anesthesia Quick Evaluation

## 2020-03-14 NOTE — Progress Notes (Signed)
Spoke to patient and gave her new arrival time of 6:45 and she was advised that she may have liquids until 6:15.  Patient stated understanding.

## 2020-03-15 ENCOUNTER — Encounter (HOSPITAL_COMMUNITY): Admission: RE | Payer: Self-pay | Source: Ambulatory Visit

## 2020-03-15 ENCOUNTER — Encounter (HOSPITAL_COMMUNITY): Payer: Self-pay | Admitting: Orthopedic Surgery

## 2020-03-15 ENCOUNTER — Ambulatory Visit (HOSPITAL_COMMUNITY): Payer: Commercial Managed Care - PPO | Admitting: Physician Assistant

## 2020-03-15 ENCOUNTER — Ambulatory Visit (HOSPITAL_COMMUNITY)
Admission: RE | Admit: 2020-03-15 | Payer: Commercial Managed Care - PPO | Source: Ambulatory Visit | Admitting: Orthopedic Surgery

## 2020-03-15 ENCOUNTER — Observation Stay (HOSPITAL_COMMUNITY)
Admission: RE | Admit: 2020-03-15 | Discharge: 2020-03-16 | Disposition: A | Discharge status: Home / Self Care | Payer: Commercial Managed Care - PPO | Attending: Orthopedic Surgery | Admitting: Orthopedic Surgery

## 2020-03-15 ENCOUNTER — Encounter (HOSPITAL_COMMUNITY)
Admission: RE | Disposition: A | Discharge status: Home / Self Care | Payer: Self-pay | Source: Home / Self Care | Attending: Orthopedic Surgery

## 2020-03-15 DIAGNOSIS — M1711 Unilateral primary osteoarthritis, right knee: Secondary | ICD-10-CM | POA: Diagnosis not present

## 2020-03-15 DIAGNOSIS — I10 Essential (primary) hypertension: Secondary | ICD-10-CM | POA: Insufficient documentation

## 2020-03-15 DIAGNOSIS — Z96651 Presence of right artificial knee joint: Secondary | ICD-10-CM

## 2020-03-15 DIAGNOSIS — Z87891 Personal history of nicotine dependence: Secondary | ICD-10-CM | POA: Insufficient documentation

## 2020-03-15 HISTORY — PX: TOTAL KNEE ARTHROPLASTY: SHX125

## 2020-03-15 LAB — BPAM RBC
Blood Product Expiration Date: 202111052359
Blood Product Expiration Date: 202111062359
Unit Type and Rh: 5100
Unit Type and Rh: 5100

## 2020-03-15 LAB — TYPE AND SCREEN
ABO/RH(D): A POS
Antibody Screen: POSITIVE
PT AG Type: NEGATIVE
Unit division: 0
Unit division: 0

## 2020-03-15 SURGERY — ARTHROPLASTY, KNEE, TOTAL
Anesthesia: General | Site: Knee | Laterality: Right

## 2020-03-15 SURGERY — ARTHROPLASTY, KNEE, TOTAL
Anesthesia: Spinal | Site: Knee | Laterality: Right

## 2020-03-15 MED ORDER — ONDANSETRON HCL 4 MG/2ML IJ SOLN: 4.0000 mg | Freq: Four times a day (QID) | INTRAMUSCULAR | Status: DC | PRN

## 2020-03-15 MED ORDER — CEFAZOLIN SODIUM-DEXTROSE 1-4 GM/50ML-% IV SOLN
1.0000 g | Freq: Once | INTRAVENOUS | Status: DC
  Filled 2020-03-15: qty 50

## 2020-03-15 MED ORDER — FENTANYL CITRATE (PF) 100 MCG/2ML IJ SOLN
INTRAMUSCULAR | Status: AC
  Filled 2020-03-15: qty 2

## 2020-03-15 MED ORDER — SODIUM CHLORIDE 0.9 % IR SOLN
Status: DC | PRN
  Administered 2020-03-15: 1000 mL

## 2020-03-15 MED ORDER — TRANEXAMIC ACID-NACL 1000-0.7 MG/100ML-% IV SOLN
INTRAVENOUS | Status: AC
  Administered 2020-03-15: 1000 mg
  Filled 2020-03-15: qty 100

## 2020-03-15 MED ORDER — PROPOFOL 1000 MG/100ML IV EMUL
INTRAVENOUS | Status: AC
  Filled 2020-03-15: qty 100

## 2020-03-15 MED ORDER — ASPIRIN EC 325 MG PO TBEC
325.0000 mg | DELAYED_RELEASE_TABLET | Freq: Two times a day (BID) | ORAL | Status: DC
  Administered 2020-03-15 – 2020-03-16 (×2): 325 mg via ORAL
  Filled 2020-03-15 (×2): qty 1

## 2020-03-15 MED ORDER — LACTATED RINGERS IV SOLN: INTRAVENOUS | Status: DC

## 2020-03-15 MED ORDER — POLYETHYLENE GLYCOL 3350 17 G PO PACK: 17.0000 g | PACK | Freq: Every day | ORAL | Status: DC | PRN

## 2020-03-15 MED ORDER — DOCUSATE SODIUM 100 MG PO CAPS
100.0000 mg | ORAL_CAPSULE | Freq: Two times a day (BID) | ORAL | Status: DC
  Administered 2020-03-15 – 2020-03-16 (×2): 100 mg via ORAL
  Filled 2020-03-15 (×2): qty 1

## 2020-03-15 MED ORDER — MAGNESIUM CITRATE PO SOLN: 1.0000 | Freq: Once | ORAL | Status: DC | PRN

## 2020-03-15 MED ORDER — 0.9 % SODIUM CHLORIDE (POUR BTL) OPTIME
TOPICAL | Status: DC | PRN
  Administered 2020-03-15: 1000 mL

## 2020-03-15 MED ORDER — METHOCARBAMOL 500 MG PO TABS
500.0000 mg | ORAL_TABLET | Freq: Four times a day (QID) | ORAL | Status: DC | PRN
  Administered 2020-03-15 – 2020-03-16 (×2): 500 mg via ORAL
  Filled 2020-03-15 (×2): qty 1

## 2020-03-15 MED ORDER — FENTANYL CITRATE (PF) 100 MCG/2ML IJ SOLN
50.0000 ug | INTRAMUSCULAR | Status: DC
  Administered 2020-03-15: 75 ug via INTRAVENOUS
  Filled 2020-03-15: qty 2

## 2020-03-15 MED ORDER — TRANEXAMIC ACID-NACL 1000-0.7 MG/100ML-% IV SOLN
1000.0000 mg | Freq: Once | INTRAVENOUS | Status: DC
  Filled 2020-03-15: qty 100

## 2020-03-15 MED ORDER — HYDROMORPHONE HCL 1 MG/ML IJ SOLN
INTRAMUSCULAR | Status: AC
Start: 2020-03-15 — End: 2020-03-16
  Filled 2020-03-15: qty 1

## 2020-03-15 MED ORDER — SUGAMMADEX SODIUM 200 MG/2ML IV SOLN
INTRAVENOUS | Status: DC | PRN
  Administered 2020-03-15: 200 mg via INTRAVENOUS

## 2020-03-15 MED ORDER — OXYCODONE HCL 5 MG PO TABS: 10.0000 mg | ORAL_TABLET | ORAL | Status: DC | PRN

## 2020-03-15 MED ORDER — DEXAMETHASONE SODIUM PHOSPHATE 10 MG/ML IJ SOLN: 10.0000 mg | Freq: Two times a day (BID) | INTRAMUSCULAR | Status: DC

## 2020-03-15 MED ORDER — METOCLOPRAMIDE HCL 5 MG PO TABS: 5.0000 mg | ORAL_TABLET | Freq: Three times a day (TID) | ORAL | Status: DC | PRN

## 2020-03-15 MED ORDER — METOCLOPRAMIDE HCL 5 MG/ML IJ SOLN: 5.0000 mg | Freq: Three times a day (TID) | INTRAMUSCULAR | Status: DC | PRN

## 2020-03-15 MED ORDER — ONDANSETRON HCL 4 MG PO TABS: 4.0000 mg | ORAL_TABLET | Freq: Four times a day (QID) | ORAL | Status: DC | PRN

## 2020-03-15 MED ORDER — SODIUM CHLORIDE 0.9 % IV SOLN: INTRAVENOUS | Status: DC

## 2020-03-15 MED ORDER — DOCUSATE SODIUM 100 MG PO CAPS: 100.0000 mg | ORAL_CAPSULE | Freq: Two times a day (BID) | ORAL | Status: DC

## 2020-03-15 MED ORDER — TRANEXAMIC ACID-NACL 1000-0.7 MG/100ML-% IV SOLN
1000.0000 mg | INTRAVENOUS | Status: AC
  Administered 2020-03-15: 1000 mg via INTRAVENOUS
  Filled 2020-03-15: qty 100

## 2020-03-15 MED ORDER — METHOCARBAMOL 500 MG IVPB - SIMPLE MED
500.0000 mg | Freq: Four times a day (QID) | INTRAVENOUS | Status: DC | PRN
  Administered 2020-03-15: 500 mg via INTRAVENOUS
  Filled 2020-03-15: qty 50

## 2020-03-15 MED ORDER — HYDROMORPHONE HCL 1 MG/ML IJ SOLN
INTRAMUSCULAR | Status: AC
  Filled 2020-03-15: qty 1

## 2020-03-15 MED ORDER — PROPOFOL 10 MG/ML IV BOLUS
INTRAVENOUS | Status: AC
  Filled 2020-03-15: qty 20

## 2020-03-15 MED ORDER — STERILE WATER FOR IRRIGATION IR SOLN
Status: DC | PRN
  Administered 2020-03-15: 2000 mL

## 2020-03-15 MED ORDER — HYDROMORPHONE HCL 1 MG/ML IJ SOLN
INTRAMUSCULAR | Status: AC
  Filled 2020-03-15: qty 2

## 2020-03-15 MED ORDER — HYDROMORPHONE HCL 1 MG/ML IJ SOLN
0.2500 mg | INTRAMUSCULAR | Status: DC | PRN
  Administered 2020-03-15 (×2): 0.5 mg via INTRAVENOUS

## 2020-03-15 MED ORDER — FENTANYL CITRATE (PF) 100 MCG/2ML IJ SOLN
100.0000 ug | INTRAMUSCULAR | Status: DC | PRN
  Administered 2020-03-15 (×2): 100 ug via INTRAVENOUS

## 2020-03-15 MED ORDER — PHENOL 1.4 % MT LIQD: 1.0000 | OROMUCOSAL | Status: DC | PRN

## 2020-03-15 MED ORDER — ONDANSETRON HCL 4 MG/2ML IJ SOLN
INTRAMUSCULAR | Status: DC | PRN
  Administered 2020-03-15: 4 mg via INTRAVENOUS

## 2020-03-15 MED ORDER — ACETAMINOPHEN 325 MG PO TABS: 325.0000 mg | ORAL_TABLET | Freq: Four times a day (QID) | ORAL | Status: DC | PRN

## 2020-03-15 MED ORDER — MENTHOL 3 MG MT LOZG: 1.0000 | LOZENGE | OROMUCOSAL | Status: DC | PRN

## 2020-03-15 MED ORDER — OXYCODONE HCL 5 MG/5ML PO SOLN: 5.0000 mg | Freq: Once | ORAL | Status: AC | PRN

## 2020-03-15 MED ORDER — LIDOCAINE 2% (20 MG/ML) 5 ML SYRINGE
INTRAMUSCULAR | Status: AC
  Filled 2020-03-15: qty 5

## 2020-03-15 MED ORDER — HYDROMORPHONE HCL 1 MG/ML IJ SOLN
0.5000 mg | INTRAMUSCULAR | Status: DC | PRN
  Administered 2020-03-15 – 2020-03-16 (×3): 1 mg via INTRAVENOUS
  Filled 2020-03-15 (×3): qty 1

## 2020-03-15 MED ORDER — METHOCARBAMOL 500 MG IVPB - SIMPLE MED
INTRAVENOUS | Status: AC
  Filled 2020-03-15: qty 50

## 2020-03-15 MED ORDER — OXYCODONE HCL 5 MG PO TABS: 5.0000 mg | ORAL_TABLET | ORAL | Status: DC | PRN

## 2020-03-15 MED ORDER — ACETAMINOPHEN 325 MG PO TABS
325.0000 mg | ORAL_TABLET | Freq: Four times a day (QID) | ORAL | Status: DC | PRN
  Administered 2020-03-15: 650 mg via ORAL
  Filled 2020-03-15 (×2): qty 2

## 2020-03-15 MED ORDER — MIDAZOLAM HCL 2 MG/2ML IJ SOLN
1.0000 mg | INTRAMUSCULAR | Status: DC
  Administered 2020-03-15: 2 mg via INTRAVENOUS
  Filled 2020-03-15: qty 2

## 2020-03-15 MED ORDER — FENTANYL CITRATE (PF) 100 MCG/2ML IJ SOLN
INTRAMUSCULAR | Status: AC
  Filled 2020-03-15: qty 4

## 2020-03-15 MED ORDER — ROCURONIUM BROMIDE 10 MG/ML (PF) SYRINGE
PREFILLED_SYRINGE | INTRAVENOUS | Status: DC | PRN
  Administered 2020-03-15: 50 mg via INTRAVENOUS

## 2020-03-15 MED ORDER — DEXAMETHASONE SODIUM PHOSPHATE 10 MG/ML IJ SOLN
10.0000 mg | Freq: Two times a day (BID) | INTRAMUSCULAR | Status: DC
  Administered 2020-03-16: 10 mg via INTRAVENOUS
  Filled 2020-03-15: qty 1

## 2020-03-15 MED ORDER — OXYCODONE HCL 5 MG PO TABS
5.0000 mg | ORAL_TABLET | ORAL | Status: DC | PRN
  Filled 2020-03-15: qty 2

## 2020-03-15 MED ORDER — LIDOCAINE 2% (20 MG/ML) 5 ML SYRINGE
INTRAMUSCULAR | Status: DC | PRN
  Administered 2020-03-15: 60 mg via INTRAVENOUS

## 2020-03-15 MED ORDER — OXYCODONE HCL 5 MG PO TABS
ORAL_TABLET | ORAL | Status: AC
  Filled 2020-03-15: qty 1

## 2020-03-15 MED ORDER — HYDROMORPHONE HCL 1 MG/ML IJ SOLN
0.2500 mg | INTRAMUSCULAR | Status: DC | PRN
  Administered 2020-03-15 (×4): 0.5 mg via INTRAVENOUS

## 2020-03-15 MED ORDER — ALUM & MAG HYDROXIDE-SIMETH 200-200-20 MG/5ML PO SUSP: 30.0000 mL | ORAL | Status: DC | PRN

## 2020-03-15 MED ORDER — ORAL CARE MOUTH RINSE: 15.0000 mL | Freq: Once | OROMUCOSAL | Status: AC

## 2020-03-15 MED ORDER — BISACODYL 5 MG PO TBEC: 5.0000 mg | DELAYED_RELEASE_TABLET | Freq: Every day | ORAL | Status: DC | PRN

## 2020-03-15 MED ORDER — HYDROMORPHONE HCL 1 MG/ML IJ SOLN: 0.5000 mg | INTRAMUSCULAR | Status: DC | PRN

## 2020-03-15 MED ORDER — BUPIVACAINE-EPINEPHRINE 0.5% -1:200000 IJ SOLN
INTRAMUSCULAR | Status: AC
  Filled 2020-03-15: qty 1

## 2020-03-15 MED ORDER — METHOCARBAMOL 1000 MG/10ML IJ SOLN: 500.0000 mg | Freq: Four times a day (QID) | INTRAVENOUS | Status: DC | PRN

## 2020-03-15 MED ORDER — ASPIRIN EC 325 MG PO TBEC: 325.0000 mg | DELAYED_RELEASE_TABLET | Freq: Two times a day (BID) | ORAL | Status: DC

## 2020-03-15 MED ORDER — DEXAMETHASONE SODIUM PHOSPHATE 10 MG/ML IJ SOLN
INTRAMUSCULAR | Status: DC | PRN
  Administered 2020-03-15: 8 mg via INTRAVENOUS

## 2020-03-15 MED ORDER — POVIDONE-IODINE 10 % EX SWAB
2.0000 "application " | Freq: Once | CUTANEOUS | Status: AC
  Administered 2020-03-15: 2 via TOPICAL

## 2020-03-15 MED ORDER — DEXAMETHASONE SODIUM PHOSPHATE 10 MG/ML IJ SOLN
INTRAMUSCULAR | Status: AC
  Filled 2020-03-15: qty 1

## 2020-03-15 MED ORDER — CEFAZOLIN SODIUM-DEXTROSE 2-4 GM/100ML-% IV SOLN
2.0000 g | INTRAVENOUS | Status: AC
  Administered 2020-03-15: 2 g via INTRAVENOUS
  Filled 2020-03-15: qty 100

## 2020-03-15 MED ORDER — DIPHENHYDRAMINE HCL 12.5 MG/5ML PO ELIX
12.5000 mg | ORAL_SOLUTION | ORAL | Status: DC | PRN
  Administered 2020-03-15: 25 mg via ORAL
  Filled 2020-03-15 (×2): qty 10

## 2020-03-15 MED ORDER — FENTANYL CITRATE (PF) 100 MCG/2ML IJ SOLN
INTRAMUSCULAR | Status: DC | PRN
  Administered 2020-03-15 (×4): 50 ug via INTRAVENOUS

## 2020-03-15 MED ORDER — ROCURONIUM BROMIDE 10 MG/ML (PF) SYRINGE
PREFILLED_SYRINGE | INTRAVENOUS | Status: AC
  Filled 2020-03-15: qty 10

## 2020-03-15 MED ORDER — METHOCARBAMOL 500 MG PO TABS: 500.0000 mg | ORAL_TABLET | Freq: Four times a day (QID) | ORAL | Status: DC | PRN

## 2020-03-15 MED ORDER — CHLORHEXIDINE GLUCONATE 0.12 % MT SOLN
15.0000 mL | Freq: Once | OROMUCOSAL | Status: AC
  Administered 2020-03-15: 15 mL via OROMUCOSAL

## 2020-03-15 MED ORDER — PROPOFOL 10 MG/ML IV BOLUS
INTRAVENOUS | Status: DC | PRN
  Administered 2020-03-15: 130 mg via INTRAVENOUS
  Administered 2020-03-15: 20 mg via INTRAVENOUS

## 2020-03-15 MED ORDER — OXYCODONE HCL 5 MG PO TABS
10.0000 mg | ORAL_TABLET | ORAL | Status: DC | PRN
  Administered 2020-03-15 – 2020-03-16 (×4): 15 mg via ORAL
  Filled 2020-03-15 (×4): qty 3

## 2020-03-15 MED ORDER — HYDROMORPHONE HCL 1 MG/ML IJ SOLN
1.0000 mg | Freq: Once | INTRAMUSCULAR | Status: AC
  Administered 2020-03-15: 1 mg via INTRAVENOUS

## 2020-03-15 MED ORDER — SODIUM CHLORIDE (PF) 0.9 % IJ SOLN
INTRAMUSCULAR | Status: AC
  Filled 2020-03-15: qty 50

## 2020-03-15 MED ORDER — DIPHENHYDRAMINE HCL 12.5 MG/5ML PO ELIX: 12.5000 mg | ORAL_SOLUTION | ORAL | Status: DC | PRN

## 2020-03-15 MED ORDER — ONDANSETRON HCL 4 MG/2ML IJ SOLN
INTRAMUSCULAR | Status: AC
  Filled 2020-03-15: qty 2

## 2020-03-15 MED ORDER — OXYCODONE HCL 5 MG PO TABS
5.0000 mg | ORAL_TABLET | Freq: Once | ORAL | Status: AC | PRN
  Administered 2020-03-15: 5 mg via ORAL

## 2020-03-15 MED ORDER — SODIUM CHLORIDE 0.9 % IV SOLN
INTRAVENOUS | Status: DC | PRN
  Administered 2020-03-15: 100 mL

## 2020-03-15 SURGICAL SUPPLY — 54 items
APL SKNCLS STERI-STRIP NONHPOA (GAUZE/BANDAGES/DRESSINGS) ×1
ATTUNE PS FEM RT SZ 4 CEM KNEE (Femur) ×1 IMPLANT
ATTUNE PSRP INSR SZ4 8 KNEE (Insert) ×1 IMPLANT
BAG SPEC THK2 15X12 ZIP CLS (MISCELLANEOUS) ×1
BAG ZIPLOCK 12X15 (MISCELLANEOUS) ×2 IMPLANT
BASEPLATE TIBIAL ROTATING SZ 4 (Knees) ×1 IMPLANT
BENZOIN TINCTURE PRP APPL 2/3 (GAUZE/BANDAGES/DRESSINGS) ×2 IMPLANT
BLADE SAGITTAL 25.0X1.19X90 (BLADE) ×2 IMPLANT
BLADE SAW SGTL 13.0X1.19X90.0M (BLADE) ×2 IMPLANT
BLADE SURG SZ10 CARB STEEL (BLADE) ×4 IMPLANT
BNDG ELASTIC 6X5.8 VLCR STR LF (GAUZE/BANDAGES/DRESSINGS) ×2 IMPLANT
BOOTIES KNEE HIGH SLOAN (MISCELLANEOUS) ×2 IMPLANT
BOWL SMART MIX CTS (DISPOSABLE) ×2 IMPLANT
BSPLAT TIB 4 CMNT ROT PLAT STR (Knees) ×1 IMPLANT
CEMENT HV SMART SET (Cement) ×4 IMPLANT
COVER SURGICAL LIGHT HANDLE (MISCELLANEOUS) ×2 IMPLANT
COVER WAND RF STERILE (DRAPES) IMPLANT
CUFF TOURN SGL QUICK 34 (TOURNIQUET CUFF) ×2
CUFF TRNQT CYL 34X4.125X (TOURNIQUET CUFF) ×1 IMPLANT
DECANTER SPIKE VIAL GLASS SM (MISCELLANEOUS) ×4 IMPLANT
DRAPE U-SHAPE 47X51 STRL (DRAPES) ×2 IMPLANT
DRSG AQUACEL AG ADV 3.5X10 (GAUZE/BANDAGES/DRESSINGS) ×2 IMPLANT
DURAPREP 26ML APPLICATOR (WOUND CARE) ×2 IMPLANT
ELECT REM PT RETURN 15FT ADLT (MISCELLANEOUS) ×2 IMPLANT
GLOVE BIOGEL PI IND STRL 8 (GLOVE) ×2 IMPLANT
GLOVE BIOGEL PI INDICATOR 8 (GLOVE) ×2
GLOVE ECLIPSE 7.5 STRL STRAW (GLOVE) ×4 IMPLANT
GOWN STRL REUS W/TWL XL LVL3 (GOWN DISPOSABLE) ×4 IMPLANT
HANDPIECE INTERPULSE COAX TIP (DISPOSABLE) ×2
HOLDER FOLEY CATH W/STRAP (MISCELLANEOUS) IMPLANT
HOOD PEEL AWAY FLYTE STAYCOOL (MISCELLANEOUS) ×6 IMPLANT
KIT TURNOVER KIT A (KITS) IMPLANT
MANIFOLD NEPTUNE II (INSTRUMENTS) ×2 IMPLANT
NEEDLE HYPO 22GX1.5 SAFETY (NEEDLE) ×2 IMPLANT
NS IRRIG 1000ML POUR BTL (IV SOLUTION) ×2 IMPLANT
PACK ICE MAXI GEL EZY WRAP (MISCELLANEOUS) ×2 IMPLANT
PACK TOTAL KNEE CUSTOM (KITS) ×2 IMPLANT
PADDING CAST COTTON 6X4 STRL (CAST SUPPLIES) ×2 IMPLANT
PATELLA MEDIAL ATTUN 35MM KNEE (Knees) ×1 IMPLANT
PENCIL SMOKE EVACUATOR (MISCELLANEOUS) IMPLANT
PIN DRILL FIX HALF THREAD (BIT) ×1 IMPLANT
PIN STEINMAN FIXATION KNEE (PIN) ×1 IMPLANT
PROTECTOR NERVE ULNAR (MISCELLANEOUS) ×2 IMPLANT
SET HNDPC FAN SPRY TIP SCT (DISPOSABLE) ×1 IMPLANT
STRIP CLOSURE SKIN 1/2X4 (GAUZE/BANDAGES/DRESSINGS) IMPLANT
SUT MNCRL AB 3-0 PS2 18 (SUTURE) ×2 IMPLANT
SUT VIC AB 0 CT1 36 (SUTURE) ×2 IMPLANT
SUT VIC AB 1 CT1 36 (SUTURE) ×4 IMPLANT
SUT VIC AB 2-0 CT1 27 (SUTURE) ×4
SUT VIC AB 2-0 CT1 TAPERPNT 27 (SUTURE) IMPLANT
SYR BULB IRRIG 60ML STRL (SYRINGE) ×1 IMPLANT
SYR CONTROL 10ML LL (SYRINGE) ×4 IMPLANT
TRAY FOLEY MTR SLVR 16FR STAT (SET/KITS/TRAYS/PACK) ×2 IMPLANT
WATER STERILE IRR 1000ML POUR (IV SOLUTION) ×4 IMPLANT

## 2020-03-15 NOTE — Anesthesia Procedure Notes (Signed)
Anesthesia Regional Block: Adductor canal block   Pre-Anesthetic Checklist: ,, timeout performed,, Correct Site,, Correct Procedure,, site marked,,, surgical consent,, at surgeon's request  Laterality: Right  Prep: Dura Prep       Needles:  Injection technique: Single-shot  Needle Type: Stimulator Needle - 80          Additional Needles:   Procedures: Doppler guided,,,, ultrasound used (permanent image in chart),,,,  Narrative:  Start time: 03/15/2020 8:00 AM End time: 03/15/2020 8:15 AM Injection made incrementally with aspirations every 5 mL. Anesthesiologist: Dorris Singh, MD

## 2020-03-15 NOTE — Transfer of Care (Signed)
Immediate Anesthesia Transfer of Care Note  Patient: Vanessa [name]  Procedure(s) Performed: TOTAL KNEE ARTHROPLASTY (Right Knee)  Patient Location: PACU  Anesthesia Type:General  Level of Consciousness: awake, alert  and oriented  Airway & Oxygen Therapy: Patient Spontanous Breathing and Patient connected to face mask oxygen  Post-op Assessment: Report given to RN, Post -op Vital signs reviewed and stable and Patient moving all extremities X 4  Post vital signs: Reviewed and stable  Last Vitals:  Vitals Value Taken Time  BP 174/92 03/15/20 1112  Temp    Pulse 97 03/15/20 1113  Resp 13 03/15/20 1113  SpO2 100 % 03/15/20 1113  Vitals shown include unvalidated device data.  Last Pain:  Vitals:   03/15/20 0704  TempSrc: Oral         Complications: No complications documented.

## 2020-03-15 NOTE — Anesthesia Procedure Notes (Signed)
Procedure Name: Intubation Date/Time: 03/15/2020 8:55 AM Performed by: Niel Hummer, CRNA Pre-anesthesia Checklist: Patient identified, Emergency Drugs available, Suction available and Patient being monitored Patient Re-evaluated:Patient Re-evaluated prior to induction Oxygen Delivery Method: Circle system utilized Preoxygenation: Pre-oxygenation with 100% oxygen Induction Type: IV induction Ventilation: Mask ventilation without difficulty Laryngoscope Size: Mac and 4 Grade View: Grade I Tube type: Oral Tube size: 7.0 mm Number of attempts: 1 Airway Equipment and Method: Stylet Placement Confirmation: ETT inserted through vocal cords under direct vision,  positive ETCO2 and breath sounds checked- equal and bilateral Secured at: 22 cm Tube secured with: Tape Dental Injury: Teeth and Oropharynx as per pre-operative assessment

## 2020-03-15 NOTE — Op Note (Signed)
PATIENT ID:      Angi Goodell  MRN:     [id number] DOB/AGE:    [birth year] / 59 y.o.       OPERATIVE REPORT   DATE OF PROCEDURE:  03/15/2020      PREOPERATIVE DIAGNOSIS:   RIGHT KNEE ENDSTAGE DEGENERATIVE JOINT DISEASE      Estimated body mass index is 34.39 kg/m as calculated from the following:   Height as of 03/12/20: 5\' 1"  (1.549 m).   Weight as of 03/12/20: 82.6 kg.                                                       POSTOPERATIVE DIAGNOSIS:   Same                                                                  PROCEDURE:  Procedure(s): TOTAL KNEE ARTHROPLASTY Using DepuyAttune RP implants #4 Femur, #4Tibia, 8 mm Attune RP bearing, 41 Patella    SURGEON: 03/14/20  ASSISTANT:   Harvie Junior PA-C   (Present and scrubbed throughout the case, critical for assistance with exposure, retraction, instrumentation, and closure.)        ANESTHESIA: general, 20cc Exparel, 50cc 0.25% Marcaine EBL: min cc FLUID REPLACEMENT: unk cc crystaloid TOURNIQUET: DRAINS: None TRANEXAMIC ACID: 1gm IV, 2gm topical COMPLICATIONS:  None         INDICATIONS FOR PROCEDURE: The patient has  RIGHT KNEE ENDSTAGE DEGENERATIVE JOINT DISEASE, mild varus deformities, XR shows bone on bone arthritis, lateral subluxation of tibia. Patient has failed all conservative measures including anti-inflammatory medicines, narcotics, attempts at exercise and weight loss, cortisone injections and viscosupplementation.  Risks and benefits of surgery have been discussed, questions answered.   DESCRIPTION OF PROCEDURE: The patient identified by armband, received  IV antibiotics, in the holding area at [facility]. Patient taken to the operating room, appropriate anesthetic monitors were attached, and general anesthesia was  induced. IV Tranexamic acid was given.Tourniquet applied high to the operative thigh. Lateral post and foot positioner applied to the table, the lower extremity was then prepped and draped in  usual sterile fashion from the toes to the tourniquet. Time-out procedure was performed.[facility], was present and scrubbed throughout the case, critical for assistance with, positioning, exposure, retraction, instrumentation, and closure.The skin and subcutaneous tissue along the incision was injected with 20 cc of a mixture of Exparel and Marcaine solution, using a 20-gauge by 1-1/2 inch needle. We began the operation, with the knee flexed 130 degrees, by making the anterior midline incision starting at handbreadth above the patella going over the patella 1 cm medial to and 4 cm distal to the tibial tubercle. Small bleeders in the skin and the subcutaneous tissue identified and cauterized. Transverse retinaculum was incised and reflected medially and a medial parapatellar arthrotomy was accomplished. the patella was everted and theprepatellar fat pad resected. The superficial medial collateral ligament was then elevated from anterior to posterior along the proximal flare of the tibia and anterior half of the menisci resected. The knee was hyperflexed exposing bone on bone arthritis. Peripheral  and notch osteophytes as well as the cruciate ligaments were then resected. We continued to work our way around posteriorly along the proximal tibia, and externally rotated the tibia subluxing it out from underneath the femur. A McHale PCL retractor was placed through the notch and a lateral Hohmann retractor placed, and we then entered the proximal tibia in line with the Depuy starter drill in line with the axis of the tibia followed by an intramedullary guide rod and 0-degree posterior slope cutting guide. The tibial cutting guide, 4 degree posterior sloped, was pinned into place allowing resection of 2 mm of bone medially and 10 mm of bone laterally. Satisfied with the tibial resection, we then entered the distal femur 2 mm anterior to the PCL origin with the intramedullary guide rod and applied the distal femoral  cutting guide set at 9 mm, with 5 degrees of valgus. This was pinned along the epicondylar axis. At this point, the distal femoral cut was accomplished without difficulty. We then sized for a #4 femoral component and pinned the guide in 3 degrees of external rotation. The chamfer cutting guide was pinned into place. The anterior, posterior, and chamfer cuts were accomplished without difficulty followed by the Attune RP box cutting guide and the box cut. We also removed posterior osteophytes from the posterior femoral condyles. The posterior capsule was injected with Exparel solution. The knee was brought into full extension. We checked our extension gap and fit a 8 mm bearing. Distracting in extension with a lamina spreader,  bleeders in the posterior capsule, Posterior medial and posterior lateral gutter were cauterized.  The transexamic acid-soaked sponge was then placed in the gap of the knee in extension. The knee was flexed 30. The posterior patella cut was accomplished with the 9.5 mm Attune cutting guide, sized for a 8mm dome, and the fixation pegs drilled.The knee was then once again hyperflexed exposing the proximal tibia. We sized for a # 4 tibial base plate, applied the smokestack and the conical reamer followed by the the Delta fin keel punch. We then hammered into place the Attune RP trial femoral component, drilled the lugs, inserted a  8 mm trial bearing, trial patellar button, and took the knee through range of motion from 0-130 degrees. Medial and lateral ligamentous stability was checked. No thumb pressure was required for patellar Tracking. The tourniquet was @52  min. All trial components were removed, mating surfaces irrigated with pulse lavage, and dried with suction and sponges. 10 cc of the Exparel solution was applied to the cancellus bone of the patella distal femur and proximal tibia.  After waiting 30 seconds, the bony surfaces were again, dried with sponges. A double batch of DePuy HV  cement was mixed and applied to all bony metallic mating surfaces except for the posterior condyles of the femur itself. In order, we hammered into place the tibial tray and removed excess cement, the femoral component and removed excess cement. The final Attune RP bearing was inserted, and the knee brought to full extension with compression. The patellar button was clamped into place, and excess cement removed. The knee was held at 30 flexion with compression, while the cement cured. The wound was irrigated out with normal saline solution pulse lavage. The rest of the Exparel was injected into the parapatellar arthrotomy, subcutaneous tissues, and periosteal tissues. The parapatellar arthrotomy was closed with running #1 Vicryl suture. The subcutaneous tissue with 3-0 undyed Vicryl suture, and the skin with running 3-0 SQ vicryl. An Aquacil and  Ace wrap were applied. The patient was taken to recovery room without difficulty.   Harvie Junior 03/15/2020, 10:30 AM

## 2020-03-15 NOTE — Progress Notes (Signed)
Physical Therapy Treatment Patient Details Name: Vanessa [name] MRN: [id number] DOB: [birth year] Today's Date: 03/15/2020    History of Present Illness Patient is 59 y.o. female s/p Rt TKA on 03/15/20 with PMH significant for HTN, OA, depression, anxiety.    PT Comments    Patient seen for additional session to progress mobility. She required min guard for bed mobility and cues for safe walker management during transfers and gait. Pt required min assist to steady with rise and repeated cues for safe step sequencing of step pattern in walker. Pt mildly lethargic during session and fatigued quickly during gait requesting seated rest break. Min Assist for transfer with RW to [facility]. Pt unable to progress to stair training and is unsafe to discharge home at this time and will benefit from additional therapy in acute session. Will follow and progress as able.   Follow Up Recommendations  Follow surgeon's recommendation for DC plan and follow-up therapies;Home health PT     Equipment Recommendations  Rolling walker with 5" wheels (youth RW)    Recommendations for Other Services       Precautions / Restrictions Precautions Precautions: Fall Restrictions Weight Bearing Restrictions: No Other Position/Activity Restrictions: WBAT    Mobility  Bed Mobility Overal bed mobility: Needs Assistance Bed Mobility: Supine to Sit;Sit to Supine     Supine to sit: Min guard;HOB elevated Sit to supine: Min guard;HOB elevated   General bed mobility comments: no assist needed to bring Rt LE off/on the bed.  Transfers Overall transfer level: Needs assistance Equipment used: Rolling walker (2 wheeled) Transfers: Sit to/from [employer] Sit to Stand: Min assist Stand pivot transfers: Min assist       General transfer comment: Cues for safe hand placement on RW, light assist/guard for power up from EOB. Cues to reach back to chair to sit. Min assist to staed and manage walker for  stand step transfer from wheelchair to [facility] and [facility] to bed.   Ambulation/Gait Ambulation/Gait assistance: Min assist Gait Distance (Feet): 35 Feet Assistive device: Rolling walker (2 wheeled) Gait Pattern/deviations: Step-to pattern;Decreased stride length;Decreased step length - left;Decreased step length - right;Decreased weight shift to right Gait velocity: decr   General Gait Details: Pt required repeated verbal cues for step pattern and for safe proximity to RW. Pt stopped after verbal cues discontinued and requried instructions to keep walking without cues. Pt fatigued and requested seated rest break. Pt appears lethargic was requiring repeated cues with poor memory for sequenicng and proximity to walker.    Stairs             Wheelchair Mobility    Modified Rankin (Stroke Patients Only)       Balance Overall balance assessment: Needs assistance Sitting-balance support: Feet supported;Bilateral upper extremity supported Sitting balance-Leahy Scale: Fair                                      Cognition Arousal/Alertness: Awake/alert Behavior During Therapy: WFL for tasks assessed/performed Overall Cognitive Status: Within Functional Limits for tasks assessed                                        Exercises      General Comments        Pertinent Vitals/Pain Pain Assessment: Faces Faces Pain Scale: Hurts little  more Pain Location: Rt knee Pain Descriptors / Indicators: Aching;Discomfort Pain Intervention(s): Limited activity within patient's tolerance;Monitored during session;Repositioned    Home Living Family/patient expects to be discharged to:: Private residence Living Arrangements: Children Available Help at Discharge: Family Type of Home: House Home Access: Stairs to enter Entrance Stairs-Rails: Right Home Layout: One level Home Equipment: Cane - single point;Grab bars - tub/shower      Prior Function Level of  Independence: Independent          PT Goals (current goals can now be found in the care plan section) Acute Rehab PT Goals Patient Stated Goal: go home today PT Goal Formulation: With patient Time For Goal Achievement: 03/22/20 Potential to Achieve Goals: Good Progress towards PT goals: Progressing toward goals    Frequency    7X/week      PT Plan Current plan remains appropriate    Co-evaluation              AM-PAC PT "6 Clicks" Mobility   Outcome Measure  Help needed turning from your back to your side while in a flat bed without using bedrails?: None Help needed moving from lying on your back to sitting on the side of a flat bed without using bedrails?: A Little Help needed moving to and from a bed to a chair (including a wheelchair)?: A Little Help needed standing up from a chair using your arms (e.g., wheelchair or bedside chair)?: A Little Help needed to walk in hospital room?: A Little Help needed climbing 3-5 steps with a railing? : A Lot 6 Click Score: 18    End of Session   Activity Tolerance: Patient tolerated treatment well;Treatment limited secondary to medical complications (Comment) (limited by bright bloody drainage from incision site) Patient left: in bed;with call bell/phone within reach Nurse Communication: Mobility status;Other (comment) (RN/MD notified of wound drainage) PT Visit Diagnosis: Muscle weakness (generalized) (M62.81);Difficulty in walking, not elsewhere classified (R26.2)     Time: 2353-6144 PT Time Calculation (min) (ACUTE ONLY): 26 min  Charges:  $Gait Training: 8-22 mins $Therapeutic Activity: 8-22 mins                     Wynn Maudlin, DPT Acute Rehabilitation Services  Office [phone] Pager [phone]  03/15/2020 5:12 PM

## 2020-03-15 NOTE — Progress Notes (Signed)
Assisted Dr Greenwith right, ultrasound guided, adductor canal block. Side rails up, monitors on throughout procedure. See vital signs in flow sheet. Tolerated Procedure well.  

## 2020-03-15 NOTE — H&P (Signed)
TOTAL KNEE ADMISSION H&P  Patient is being admitted for right total knee arthroplasty.  Subjective:  Chief Complaint:right knee pain.  HPI: Vanessa [name], 59 y.o. female, has a history of pain and functional disability in the right knee due to arthritis and has failed non-surgical conservative treatments for greater than 12 weeks to includeNSAID's and/or analgesics, corticosteriod injections, viscosupplementation injections, weight reduction as appropriate and activity modification.  Onset of symptoms was gradual, starting 5 years ago with gradually worsening course since that time. The patient noted no past surgery on the right knee(s).  Patient currently rates pain in the right knee(s) at 8 out of 10 with activity. Patient has night pain, worsening of pain with activity and weight bearing, pain that interferes with activities of daily living and joint swelling.  Patient has evidence of subchondral cysts, periarticular osteophytes, joint subluxation and joint space narrowing by imaging studies. This patient has had Failure of all reasonable conservative care. There is no active infection.  Patient Active Problem List   Diagnosis Date Noted   Primary osteoarthritis of right knee 03/15/2020   Status post total knee replacement, right 03/15/2020   Incontinence of feces    Obesity (BMI 30-39.9) 07/12/2018   Allergic rhinitis 07/22/2017   Tear of medial meniscus of right knee, current 03/03/2017   Tear of lateral meniscus of right knee, current 03/03/2017   Vitamin D deficiency 12/04/2016   Baker's cyst of knee, right 05/21/2016   Edema 12/03/2015   Liver fibrosis 07/25/2015   S/P laparoscopic cholecystectomy 11/25/2014   Knee pain, right 07/17/2014   Physical exam 07/17/2014   Pap smear for cervical cancer screening 07/17/2014   Hyperlipidemia 01/12/2014   History of hepatitis C 04/27/2013   Substance abuse in remission (HCC) 04/27/2013   Hypertension    Depression     Past Medical History:  Diagnosis Date   Anxiety    Arthritis    Chronic knee pain    Depression    Diverticulosis    Gall stone    Hepatitis C    Hx of Hepatitis C   Hypertension    Syphilis     Past Surgical History:  Procedure Laterality Date   ANAL RECTAL MANOMETRY N/A 02/20/2019   Procedure: ANO RECTAL MANOMETRY;  Surgeon: Rachael Fee, MD;  Location: [facility];  Service: Endoscopy;  Laterality: N/A;   CARPAL TUNNEL RELEASE Left    CHOLECYSTECTOMY N/A 11/24/2014   Procedure: LAPAROSCOPIC CHOLECYSTECTOMY WITH INTRAOPERATIVE CHOLANGIOGRAM;  Surgeon: Gaynelle Adu, MD;  Location: [facility];  Service: General;  Laterality: N/A;   ECTOPIC PREGNANCY SURGERY      Current Facility-Administered Medications  Medication Dose Route Frequency Provider Last Rate Last Admin   bupivacaine liposome (EXPAREL) 1.3 % injection 266 mg  20 mL Other Once Jodi Geralds, MD       ceFAZolin (ANCEF) IVPB 2g/100 mL premix  2 g Intravenous On Call to OR Jodi Geralds, MD       chlorhexidine (PERIDEX) 0.12 % solution 15 mL  15 mL Mouth/Throat Once Lucretia Kern, MD       Or   MEDLINE mouth rinse  15 mL Mouth Rinse Once Lucretia Kern, MD       fentaNYL (SUBLIMAZE) injection 50-100 mcg  50-100 mcg Intravenous UD Finucane, Elizabeth M, DO       lactated ringers infusion   Intravenous Continuous Lucretia Kern, MD       midazolam (VERSED) injection 1-2 mg  1-2 mg Intravenous UD  Lacy Duverney M, DO       povidone-iodine 10 % swab 2 application  2 application Topical Once Jodi Geralds, MD       tranexamic acid (CYKLOKAPRON) IVPB 1,000 mg  1,000 mg Intravenous To OR Jodi Geralds, MD       No Known Allergies  Social History   Tobacco Use   Smoking status: Former Smoker    Packs/day: 1.00    Years: 35.00    Pack years: 35.00    Types: E-cigarettes    Start date: 05/25/1994    Quit date: 03/03/2016    Years since quitting: 4.0   Smokeless tobacco: Never Used   Substance Use Topics   Alcohol use: No    Alcohol/week: 0.0 standard drinks    Comment: "last alcohol was in the 1990's"    Family History  Problem Relation Age of Onset   Hypertension Mother    Diabetes Mother    Drug abuse Brother    Esophageal cancer Brother 69   Colon cancer Brother 32   Hypertension Sister        x's 2   Diabetes Sister        oldest sister   Stomach cancer Neg Hx    Breast cancer Neg Hx    Rectal cancer Neg Hx      Review of Systems ROS: I have reviewed the patient's review of systems thoroughly and there are no positive responses as relates to the HPI. Objective:  Physical Exam  Vital signs in last 24 hours: Temp:  [97.8 F (36.6 C)] 97.8 F (36.6 C) (10/22 0704) Pulse Rate:  [75] 75 (10/22 0704) Resp:  [17] 17 (10/22 0704) BP: (162)/(87) 162/87 (10/22 0704) SpO2:  [99 %] 99 % (10/22 0704) Well-developed well-nourished patient in no acute distress. Alert and oriented x3 HEENT:within normal limits Cardiac: Regular rate and rhythm Pulmonary: Lungs clear to auscultation Abdomen: Soft and nontender.  Normal active bowel sounds  Musculoskeletal: (Right knee: Painful range of motion.  Limited range of motion.  No instability.  Trace effusion. Labs: Recent Results (from the past 2160 hour(s))  Surgical pcr screen     Status: None   Collection Time: 03/12/20  1:17 PM   Specimen: Nasal Mucosa; Nasal Swab  Result Value Ref Range   MRSA, PCR NEGATIVE NEGATIVE   Staphylococcus aureus NEGATIVE NEGATIVE    Comment: (NOTE) The Xpert SA Assay (FDA approved for NASAL specimens in patients 31 years of age and older), is one component of a comprehensive surveillance program. It is not intended to diagnose infection nor to guide or monitor treatment. Performed at [facility], [address] [place], [place] [address]   APTT     Status: None   Collection Time: 03/12/20  1:17 PM  Result Value Ref Range   aPTT 34 24 - 36  seconds    Comment: Performed at [facility], [address] [place], [place] [address]  CBC WITH DIFFERENTIAL     Status: None   Collection Time: 03/12/20  1:17 PM  Result Value Ref Range   WBC 7.2 4.0 - 10.5 K/uL   RBC 4.35 3.87 - 5.11 MIL/uL   Hemoglobin 12.4 12.0 - 15.0 g/dL   HCT 17.0 36 - 46 %   MCV 90.1 80.0 - 100.0 fL   MCH 28.5 26.0 - 34.0 pg   MCHC 31.6 30.0 - 36.0 g/dL   RDW 01.7 49.4 - 49.6 %   Platelets 255  150 - 400 K/uL   nRBC 0.0 0.0 - 0.2 %   Neutrophils Relative % 60 %   Neutro Abs 4.3 1.7 - 7.7 K/uL   Lymphocytes Relative 34 %   Lymphs Abs 2.4 0.7 - 4.0 K/uL   Monocytes Relative 5 %   Monocytes Absolute 0.4 0.1 - 1.0 K/uL   Eosinophils Relative 1 %   Eosinophils Absolute 0.1 0.0 - 0.5 K/uL   Basophils Relative 0 %   Basophils Absolute 0.0 0.0 - 0.1 K/uL   Immature Granulocytes 0 %   Abs Immature Granulocytes 0.01 0.00 - 0.07 K/uL    Comment: Performed at [facility], [address], [place], [place] [address]  Comprehensive metabolic panel     Status: None   Collection Time: 03/12/20  1:17 PM  Result Value Ref Range   Sodium 140 135 - 145 mmol/L   Potassium 4.0 3.5 - 5.1 mmol/L   Chloride 103 98 - 111 mmol/L   CO2 27 22 - 32 mmol/L   Glucose, Bld 86 70 - 99 mg/dL    Comment: Glucose reference range applies only to samples taken after fasting for at least 8 hours.   BUN 20 6 - 20 mg/dL   Creatinine, Ser 6.04 0.44 - 1.00 mg/dL   Calcium 9.0 8.9 - 54.0 mg/dL   Total Protein 7.3 6.5 - 8.1 g/dL   Albumin 3.8 3.5 - 5.0 g/dL   AST 19 15 - 41 U/L   ALT 19 0 - 44 U/L   Alkaline Phosphatase 45 38 - 126 U/L   Total Bilirubin 0.4 0.3 - 1.2 mg/dL   GFR, Estimated >98 >11 mL/min   Anion gap 10 5 - 15    Comment: Performed at [facility], [address], [place], [place] [address]  Protime-INR     Status: None   Collection Time: 03/12/20  1:17 PM  Result Value Ref Range   Prothrombin Time 13.6 11.4 -  15.2 seconds   INR 1.1 0.8 - 1.2    Comment: (NOTE) INR goal varies based on device and disease states. Performed at [facility], [address] [place], [place] [address]   Type and screen Order type and screen if day of surgery is less than 15 days from draw of preadmission visit or order morning of surgery if day of surgery is greater than 6 days from preadmission visit.     Status: None (Preliminary result)   Collection Time: 03/12/20  1:17 PM  Result Value Ref Range   ABO/RH(D) A POS    Antibody Screen POS    Sample Expiration 03/15/2020,2359    Antibody Identification ANTI K    PT AG Type NEGATIVE FOR KELL ANTIGEN    Unit Number [id number]    Blood Component Type RED CELLS,LR    Unit division 00    Status of Unit ALLOCATED    Transfusion Status OK TO TRANSFUSE    Crossmatch Result      COMPATIBLE Performed at [facility], [address], [place], [place] [address]    Unit Number [id number]    Blood Component Type RED CELLS,LR    Unit division 00    Status of Unit ALLOCATED    Transfusion Status OK TO TRANSFUSE    Crossmatch Result COMPATIBLE   Urinalysis, Routine w reflex microscopic Urine, Clean Catch     Status: None   Collection Time: 03/12/20  1:17 PM  Result Value Ref  Range   Color, Urine YELLOW YELLOW   APPearance CLEAR CLEAR   Specific Gravity, Urine 1.014 1.005 - 1.030   pH 5.0 5.0 - 8.0   Glucose, UA NEGATIVE NEGATIVE mg/dL   Hgb urine dipstick NEGATIVE NEGATIVE   Bilirubin Urine NEGATIVE NEGATIVE   Ketones, ur NEGATIVE NEGATIVE mg/dL   Protein, ur NEGATIVE NEGATIVE mg/dL   Nitrite NEGATIVE NEGATIVE   Leukocytes,Ua NEGATIVE NEGATIVE    Comment: Performed at [facility], [address] [place], [place] [address]  BPAM RBC     Status: None (Preliminary result)   Collection Time: 03/12/20  1:17 PM  Result Value Ref Range   Blood Product Unit Number [id number]    PRODUCT CODE [id number]     Unit Type and Rh 5100    Blood Product Expiration Date [id number]    Blood Product Unit Number [id number]    PRODUCT CODE [id number]    Unit Type and Rh 5100    Blood Product Expiration Date [id number]   SARS CORONAVIRUS 2 (TAT 6-24 HRS) Nasopharyngeal Nasopharyngeal Swab     Status: None   Collection Time: 03/13/20  8:58 AM   Specimen: Nasopharyngeal Swab  Result Value Ref Range   SARS Coronavirus 2 NEGATIVE NEGATIVE    Comment: (NOTE) SARS-CoV-2 target nucleic acids are NOT DETECTED.  The SARS-CoV-2 RNA is generally detectable in upper and lower respiratory specimens during the acute phase of infection. Negative results do not preclude SARS-CoV-2 infection, do not rule out co-infections with other pathogens, and should not be used as the sole basis for treatment or other patient management decisions. Negative results must be combined with clinical observations, patient history, and epidemiological information. The expected result is Negative.  Fact Sheet for Patients: HairSlick.nohttps://www.fda.gov/media/138098/download  Fact Sheet for Healthcare Providers: quierodirigir.comhttps://www.fda.gov/media/138095/download  This test is not yet approved or cleared by the [place] FDA and  has been authorized for detection and/or diagnosis of SARS-CoV-2 by FDA under an Emergency Use Authorization (EUA). This EUA will remain  in effect (meaning this test can be used) for the duration of the COVID-19 declaration under Se ction 564(b)(1) of the Act, 21 U.S.C. section 360bbb-3(b)(1), unless the authorization is terminated or revoked sooner.  Performed at [facility], [address] [place], [place] [address]     Estimated body mass index is 34.39 kg/m as calculated from the following:   Height as of 03/12/20: 5\' 1"  (1.549 m).   Weight as of 03/12/20: 82.6 kg.   Imaging Review Plain radiographs demonstrate severe degenerative joint disease of the right knee(s). The overall alignment  ismild varus. The bone quality appears to be fair for age and reported activity level.      Assessment/Plan:  End stage arthritis, right knee   The patient history, physical examination, clinical judgment of the provider and imaging studies are consistent with end stage degenerative joint disease of the right knee(s) and total knee arthroplasty is deemed medically necessary. The treatment options including medical management, injection therapy arthroscopy and arthroplasty were discussed at length. The risks and benefits of total knee arthroplasty were presented and reviewed. The risks due to aseptic loosening, infection, stiffness, patella tracking problems, thromboembolic complications and other imponderables were discussed. The patient acknowledged the explanation, agreed to proceed with the plan and consent was signed. Patient is being admitted for inpatient treatment for surgery, pain control, PT, OT, prophylactic antibiotics, VTE prophylaxis, progressive ambulation and ADL's and discharge planning. The patient is planning to be discharged  home with home health services     Patient's anticipated LOS is less than 2 midnights, meeting these requirements: - Younger than 23 - Lives within 1 hour of care - Has a competent adult at home to recover with post-op recover - NO history of  - Chronic pain requiring opiods  - Diabetes  - Coronary Artery Disease  - Heart failure  - Heart attack  - Stroke  - DVT/VTE  - Cardiac arrhythmia  - Respiratory Failure/COPD  - Renal failure  - Anemia  - Advanced Liver disease

## 2020-03-15 NOTE — Progress Notes (Signed)
Asked patient if she wanted to go on CPM once she arrived upstairs. Stated she was tired and wanted to get her rest.

## 2020-03-15 NOTE — Evaluation (Signed)
Physical Therapy Evaluation Patient Details Name: Vanessa [name] MRN: [id number] DOB: 12/20/1960 Today's Date: 03/15/2020   History of Present Illness  Patient is 59 y.o. female s/p Rt TKA on 03/15/20 with PMH significant for HTN, OA, depression, anxiety.  Clinical Impression  Vanessa [name] is a 59 y.o. female POD 0 s/p Rt TKA. Patient reports independence with mobility at baseline. Patient is now limited by functional impairments (see PT problem list below) and requires min guard/assist for bed mobility this session. Evaluation limited by incision drainage of bright blood through dressing, ace wrap, and Ted hose. patient returned to supine in bed and RN/MD alerted.  Patient will benefit from continued skilled PT interventions to address impairments and progress towards PLOF. Acute PT will follow to progress mobility and stair training in preparation for safe discharge home.      Follow Up Recommendations Follow surgeon's recommendation for DC plan and follow-up therapies;Home health PT    Equipment Recommendations  Rolling walker with 5" wheels (youth RW)    Recommendations for Other Services       Precautions / Restrictions Precautions Precautions: Fall Restrictions Weight Bearing Restrictions: No      Mobility  Bed Mobility Overal bed mobility: Needs Assistance Bed Mobility: Supine to Sit;Sit to Supine     Supine to sit: Min guard Sit to supine: Min assist   General bed mobility comments: cues to move to EOB, pt using bed rail to raise trunk, no assist needed for LE mobility to EOB. Pt's Rt knee weeping bright red blood after sitting up. Min assist to return pt to supine. RN and MD notified about wound drainage.     Transfers                    Ambulation/Gait                Stairs            Wheelchair Mobility    Modified Rankin (Stroke Patients Only)       Balance Overall balance assessment: Needs assistance Sitting-balance support:  Feet supported;Bilateral upper extremity supported Sitting balance-Leahy Scale: Fair                                       Pertinent Vitals/Pain Pain Assessment: Faces Faces Pain Scale: Hurts little more Pain Location: Rt knee Pain Descriptors / Indicators: Aching;Discomfort Pain Intervention(s): Limited activity within patient's tolerance;Monitored during session;Repositioned    Home Living Family/patient expects to be discharged to:: Private residence Living Arrangements: Children Available Help at Discharge: Family Type of Home: House Home Access: Stairs to enter Entrance Stairs-Rails: Right Entrance Stairs-Number of Steps: 3 Home Layout: One level Home Equipment: Cane - single point;Grab bars - tub/shower      Prior Function Level of Independence: Independent               Hand Dominance   Dominant Hand: Right    Extremity/Trunk Assessment   Upper Extremity Assessment Upper Extremity Assessment: Overall WFL for tasks assessed    Lower Extremity Assessment Lower Extremity Assessment: RLE deficits/detail RLE Deficits / Details: good quad activation, no extensor lag with SLR RLE Sensation: WNL RLE Coordination: WNL       Communication   Communication: No difficulties  Cognition Arousal/Alertness: Awake/alert Behavior During Therapy: WFL for tasks assessed/performed Overall Cognitive Status: Within Functional Limits for tasks assessed  General Comments      Exercises     Assessment/Plan    PT Assessment Patient needs continued PT services  PT Problem List Decreased strength;Decreased range of motion;Decreased activity tolerance;Decreased balance;Decreased mobility;Decreased knowledge of use of DME;Decreased safety awareness;Decreased knowledge of precautions;Pain       PT Treatment Interventions DME instruction;Gait training;Stair training;Functional mobility  training;Therapeutic activities;Therapeutic exercise;Balance training;Patient/family education    PT Goals (Current goals can be found in the Care Plan section)  Acute Rehab PT Goals Patient Stated Goal: go home today PT Goal Formulation: With patient Time For Goal Achievement: 03/22/20 Potential to Achieve Goals: Good    Frequency 7X/week   Barriers to discharge        Co-evaluation               AM-PAC PT "6 Clicks" Mobility  Outcome Measure Help needed turning from your back to your side while in a flat bed without using bedrails?: None Help needed moving from lying on your back to sitting on the side of a flat bed without using bedrails?: A Little Help needed moving to and from a bed to a chair (including a wheelchair)?: A Lot Help needed standing up from a chair using your arms (e.g., wheelchair or bedside chair)?: A Lot Help needed to walk in hospital room?: A Lot Help needed climbing 3-5 steps with a railing? : A Lot 6 Click Score: 15    End of Session   Activity Tolerance: Patient tolerated treatment well;Treatment limited secondary to medical complications (Comment) (limited by bright bloody drainage from incision site) Patient left: in bed;with call bell/phone within reach Nurse Communication: Mobility status;Other (comment) (RN/MD notified of wound drainage) PT Visit Diagnosis: Muscle weakness (generalized) (M62.81);Difficulty in walking, not elsewhere classified (R26.2)    Time: 1358-1410 PT Time Calculation (min) (ACUTE ONLY): 12 min   Charges:   PT Evaluation $PT Eval Low Complexity: 1 Low         Wynn Maudlin, DPT Acute Rehabilitation Services  Office [phone] Pager [phone]  03/15/2020 4:20 PM

## 2020-03-15 NOTE — Anesthesia Postprocedure Evaluation (Signed)
Anesthesia Post Note  Patient: Vanessa [name]  Procedure(s) Performed: TOTAL KNEE ARTHROPLASTY (Right Knee)     Patient location during evaluation: PACU Anesthesia Type: General Level of consciousness: awake Pain management: pain level controlled Vital Signs Assessment: post-procedure vital signs reviewed and stable Respiratory status: spontaneous breathing Cardiovascular status: stable Postop Assessment: no apparent nausea or vomiting Anesthetic complications: no   No complications documented.  Last Vitals:  Vitals:   03/15/20 1215 03/15/20 1230  BP: (!) 145/86 (!) 147/94  Pulse: 100 100  Resp: 19 20  Temp:  36.8 C  SpO2: 94% 95%    Last Pain:  Vitals:   03/15/20 1230  TempSrc:   PainSc: 7                  Jenissa Tyrell

## 2020-03-15 NOTE — Discharge Instructions (Signed)
-  You have an Ace bandage on your surgical leg.  Keep this in place for 2 days.  You may remove it on day 3 after surgery. -You have a waterproof bandage on your Ace wrap.  Please keep this in place until we see you back in clinic in 2 weeks. -Do not apply lotions, creams or powders on or near the bandage. -You may bear weight on her surgical leg beginning today. -You should elevate and ice your knee several times a day to help with swelling and pain. -Use your CPM machine as instructed.

## 2020-03-16 ENCOUNTER — Other Ambulatory Visit: Payer: Self-pay

## 2020-03-16 DIAGNOSIS — M1711 Unilateral primary osteoarthritis, right knee: Secondary | ICD-10-CM | POA: Diagnosis not present

## 2020-03-16 LAB — CBC
HCT: 31.3 % — ABNORMAL LOW (ref 36.0–46.0)
Hemoglobin: 10.1 g/dL — ABNORMAL LOW (ref 12.0–15.0)
MCH: 28.9 pg (ref 26.0–34.0)
MCHC: 32.3 g/dL (ref 30.0–36.0)
MCV: 89.4 fL (ref 80.0–100.0)
Platelets: 239 10*3/uL (ref 150–400)
RBC: 3.5 MIL/uL — ABNORMAL LOW (ref 3.87–5.11)
RDW: 14.6 % (ref 11.5–15.5)
WBC: 12.1 10*3/uL — ABNORMAL HIGH (ref 4.0–10.5)
nRBC: 0 % (ref 0.0–0.2)

## 2020-03-16 MED ORDER — VENLAFAXINE HCL ER 150 MG PO CP24: 150.0000 mg | ORAL_CAPSULE | Freq: Every day | ORAL | Status: DC

## 2020-03-16 MED ORDER — HYDROCHLOROTHIAZIDE 12.5 MG PO CAPS
12.5000 mg | ORAL_CAPSULE | Freq: Every day | ORAL | Status: DC
  Administered 2020-03-16: 12.5 mg via ORAL
  Filled 2020-03-16: qty 1

## 2020-03-16 MED ORDER — VENLAFAXINE HCL ER 75 MG PO CP24
225.0000 mg | ORAL_CAPSULE | Freq: Every day | ORAL | Status: DC
  Administered 2020-03-16: 225 mg via ORAL
  Filled 2020-03-16: qty 1

## 2020-03-16 MED ORDER — ALPRAZOLAM 0.5 MG PO TABS: 0.5000 mg | ORAL_TABLET | Freq: Two times a day (BID) | ORAL | Status: DC | PRN

## 2020-03-16 MED ORDER — VENLAFAXINE HCL ER 75 MG PO CP24: 75.0000 mg | ORAL_CAPSULE | Freq: Every day | ORAL | Status: DC

## 2020-03-16 MED ORDER — BENAZEPRIL-HYDROCHLOROTHIAZIDE 10-12.5 MG PO TABS: 1.0000 | ORAL_TABLET | Freq: Every day | ORAL | Status: DC

## 2020-03-16 MED ORDER — BENAZEPRIL HCL 10 MG PO TABS
10.0000 mg | ORAL_TABLET | Freq: Every day | ORAL | Status: DC
  Administered 2020-03-16: 10 mg via ORAL
  Filled 2020-03-16: qty 1

## 2020-03-16 NOTE — Progress Notes (Signed)
Physical Therapy Treatment Patient Details Name: Vanessa [name] MRN: [id number] DOB: 1960/10/08 Today's Date: 03/16/2020    History of Present Illness Patient is 59 y.o. female s/p Rt TKA on 03/15/20 with PMH significant for HTN, OA, depression, anxiety.    PT Comments    Pt eager to d/c home, pt requesting to finish all acute PT goals in one session even though pt reporting 10/10 pain during session. Pt overall requiring supervision to min guard level of assist for short-distance ambulation, transfers, and steps. Pt proficiently completed stair navigation, pt's daughter present and understands how to assist/guard her mother on steps at d/c. PT administered, reviewed, and practiced TKR exercises with pt, pt with no further questions.    Follow Up Recommendations  Follow surgeon's recommendation for DC plan and follow-up therapies;Home health PT     Equipment Recommendations  Rolling walker with 5" wheels (youth RW)    Recommendations for Other Services       Precautions / Restrictions Precautions Precautions: Fall Restrictions Weight Bearing Restrictions: No RLE Weight Bearing: Weight bearing as tolerated    Mobility  Bed Mobility Overal bed mobility: Needs Assistance Bed Mobility: Supine to Sit     Supine to sit: Supervision;HOB elevated     General bed mobility comments: for safety, verbal cuing for sequencing to EOB.  Transfers Overall transfer level: Needs assistance Equipment used: Rolling walker (2 wheeled) Transfers: Sit to/from Stand Sit to Stand: From elevated surface;Supervision;Min guard         General transfer comment: Initially min guard during first stand, transition to supervision for safety on second stand. Verbal cuing for hand placement during stand and sit, placing RLE out in front prior to sitting to avoid excessive knee flexion given pt's pain level.  Ambulation/Gait Ambulation/Gait assistance: Min guard Gait Distance (Feet): 50  Feet Assistive device: Rolling walker (2 wheeled) Gait Pattern/deviations: Step-to pattern;Decreased stride length;Decreased step length - left;Decreased step length - right;Decreased weight shift to right;Trunk flexed Gait velocity: decr   General Gait Details: Min guard for safety, pt reporting x1 R knee buckle but not observed by PT. Verbal cuing for upright posture, placement in RW, turning with RW.   Stairs Stairs: Yes Stairs assistance: Min guard Stair Management: One rail Right;Step to pattern;Sideways Number of Stairs: 3 General stair comments: Min guard for safety, verbal cuing for sequencing (up with good leg, down with bad leg leading), use of rail to offweight RLE, and positioning of PT below pt on steps. Pt's daughter present, will be assisting pt up/down steps on d/c.   Wheelchair Mobility    Modified Rankin (Stroke Patients Only)       Balance Overall balance assessment: Needs assistance Sitting-balance support: Feet supported;Bilateral upper extremity supported Sitting balance-Leahy Scale: Fair     Standing balance support: Bilateral upper extremity supported Standing balance-Leahy Scale: Poor Standing balance comment: reliant on RW                            Cognition Arousal/Alertness: Awake/alert Behavior During Therapy: WFL for tasks assessed/performed Overall Cognitive Status: Within Functional Limits for tasks assessed                                        Exercises Total Joint Exercises Ankle Circles/Pumps: AROM;Both;10 reps;Supine Quad Sets: AROM;Right;10 reps;Supine (towel roll under ankle) Heel Slides: AAROM;Right;10 reps;Seated Hip ABduction/ADduction:  AROM;Right;10 reps;Seated Straight Leg Raises: AAROM;Right;5 reps;Seated Goniometric ROM: knee aarom ~5-45*, limited by stiffness and pain    General Comments        Pertinent Vitals/Pain Pain Assessment: 0-10 Pain Score: 10-Worst pain ever Pain Location: Rt  knee Pain Descriptors / Indicators: Aching;Discomfort;Crying;Sore Pain Intervention(s): Limited activity within patient's tolerance;Monitored during session;Repositioned    Home Living                      Prior Function            PT Goals (current goals can now be found in the care plan section) Acute Rehab PT Goals Patient Stated Goal: go home today PT Goal Formulation: With patient Time For Goal Achievement: 03/22/20 Potential to Achieve Goals: Good Progress towards PT goals: Progressing toward goals    Frequency    7X/week      PT Plan Current plan remains appropriate    Co-evaluation              AM-PAC PT "6 Clicks" Mobility   Outcome Measure  Help needed turning from your back to your side while in a flat bed without using bedrails?: None Help needed moving from lying on your back to sitting on the side of a flat bed without using bedrails?: A Little Help needed moving to and from a bed to a chair (including a wheelchair)?: A Little Help needed standing up from a chair using your arms (e.g., wheelchair or bedside chair)?: A Little Help needed to walk in hospital room?: A Little Help needed climbing 3-5 steps with a railing? : A Lot 6 Click Score: 18    End of Session Equipment Utilized During Treatment: Gait belt Activity Tolerance: Patient tolerated treatment well Patient left: with call bell/phone within reach;in chair;with family/visitor present Nurse Communication: Mobility status PT Visit Diagnosis: Muscle weakness (generalized) (M62.81);Difficulty in walking, not elsewhere classified (R26.2)     Time: 0912-0950 PT Time Calculation (min) (ACUTE ONLY): 38 min  Charges:  $Gait Training: 23-37 mins $Therapeutic Exercise: 8-22 mins                    Zorawar Strollo E, PT Acute Rehabilitation Services Pager [phone]  Office [phone]   Rylen Hou D Makeisha Jentsch 03/16/2020, 10:14 AM

## 2020-03-16 NOTE — TOC Progression Note (Signed)
Transition of Care [facility]) - Progression Note    Patient Details  Name: Vanessa [name] MRN: [id number] Date of Birth: 10-14-1960  Transition of Care [facility]) CM/SW Contact  Armanda Heritage, RN Phone Number: 03/16/2020, 11:33 AM  Clinical Narrative:    Patient set up with [facility] for HHPT. [employer] to deliver rolling walker and 3in1 to bedside for home use.    Expected Discharge Plan: Home w Home Health Services Barriers to Discharge: No Barriers Identified  Expected Discharge Plan and Services Expected Discharge Plan: Home w Home Health Services   Discharge Planning Services: CM Consult Post Acute Care Choice: Home Health Living arrangements for the past 2 months: Single Family Home Expected Discharge Date: 03/16/20               DME Arranged: Dan Humphreys rolling, 3-N-1 DME Agency: [employer] Date DME Agency Contacted: 03/16/20 Time DME Agency Contacted: [phone] Representative spoke with at DME Agency: Kathline Magic HH Arranged: PT HH Agency: [facility] (formerly [facility]) Date HH Agency Contacted: 03/16/20 Time HH Agency Contacted: [phone] Representative spoke with at [facility] Agency: Kathlene November   Social Determinants of Health (SDOH) Interventions    Readmission Risk Interventions No flowsheet data found.

## 2020-03-16 NOTE — Progress Notes (Signed)
Orthopedic Tech Progress Note Patient Details:  Vanessa [name] Oct 09, 1960 [id number]  Patient ID: Vanessa [name], female   DOB: 1960-06-16, 59 y.o.   MRN: [id number]   Vanessa [name] 03/16/2020, 11:30 AM

## 2020-03-16 NOTE — Plan of Care (Signed)
  Problem: Education: Goal: Knowledge of the prescribed therapeutic regimen will improve Outcome: Progressing Goal: Individualized Educational Video(s) Outcome: Progressing   Problem: Pain Management: Goal: Pain level will decrease with appropriate interventions Outcome: Progressing   Problem: Coping: Goal: Level of anxiety will decrease Outcome: Progressing   Problem: Pain Managment: Goal: General experience of comfort will improve Outcome: Progressing

## 2020-03-16 NOTE — Progress Notes (Signed)
PATIENT ID: Vanessa [name]  MRN: [id number]  DOB/AGE:  10/03/1960 / 59 y.o.  1 Day Post-Op Procedure(s) (LRB): TOTAL KNEE ARTHROPLASTY (Right)    PROGRESS NOTE Subjective: Patient is alert, oriented, no Nausea, no Vomiting, yes passing gas. Taking PO well. Denies SOB, Chest or Calf Pain. Using Incentive Spirometer, PAS in place. Ambulate WBAT with pt walking 35 ft with therapy, Patient reports pain as 8-10/10 .    Objective: Vital signs in last 24 hours: Vitals:   03/16/20 0124 03/16/20 0128 03/16/20 0521 03/16/20 0525  BP: 129/80  127/70 136/70  Pulse: 79  78 84  Resp: 16  16 18   Temp: 98.4 F (36.9 C)  98.1 F (36.7 C) 98.6 F (37 C)  TempSrc:      SpO2: 97%  98% 90%  Weight:  92.7 kg    Height:          Intake/Output from previous day: I/O last 3 completed shifts: In: 2840 [P.O.:840; I.V.:1800; IV Piggyback:200] Out: 3250 [Urine:3200; Blood:50]   Intake/Output this shift: Total I/O In: -  Out: 450 [Urine:450]   LABORATORY DATA: Recent Labs    03/16/20 0308  WBC 12.1*  HGB 10.1*  HCT 31.3*  PLT 239    Examination: Neurologically intact Neurovascular intact Sensation intact distally Intact pulses distally Dorsiflexion/Plantar flexion intact Incision: dressing C/D/I No cellulitis present Compartment soft}  Assessment:   1 Day Post-Op Procedure(s) (LRB): TOTAL KNEE ARTHROPLASTY (Right) ADDITIONAL DIAGNOSIS: Expected Acute Blood Loss Anemia, depression  Patient's anticipated LOS is less than 2 midnights, meeting these requirements: - Younger than 73 - Lives within 1 hour of care - Has a competent adult at home to recover with post-op recover - NO history of  - Chronic pain requiring opiods  - Diabetes  - Coronary Artery Disease  - Heart failure  - Heart attack  - Stroke  - DVT/VTE  - Cardiac arrhythmia  - Respiratory Failure/COPD  - Renal failure  - Anemia  - Advanced Liver disease       Plan: PT/OT WBAT, AROM and PROM  DVT  Prophylaxis:  SCDx72hrs, 325 mg  2 x daily x 2 weeks DISCHARGE PLAN: Home today once pt meets therapy goals DISCHARGE NEEDS: HHPT, Walker and 3-in-1 comode seat     76 03/16/2020, 8:20 AM

## 2020-03-16 NOTE — Discharge Summary (Signed)
Patient ID: Vanessa [name] MRN: [id number] DOB/AGE: 1960/09/06 59 y.o.  Admit date: 03/15/2020 Discharge date: 03/16/2020  Admission Diagnoses:  Principal Problem:   Status post total knee replacement, right Active Problems:   Primary osteoarthritis of right knee   Discharge Diagnoses:  Same  Past Medical History:  Diagnosis Date  . Anxiety   . Arthritis   . Chronic knee pain   . Depression   . Diverticulosis   . Gall stone   . Hepatitis C    Hx of Hepatitis C  . Hypertension   . Syphilis     Surgeries: Procedure(s): TOTAL KNEE ARTHROPLASTY on 03/15/2020   Consultants:   Discharged Condition: Improved  Hospital Course: Vanessa [name] is an 59 y.o. female who was admitted 03/15/2020 for operative treatment ofStatus post total knee replacement, right. Patient has severe unremitting pain that affects sleep, daily activities, and work/hobbies. After pre-op clearance the patient was taken to the operating room on 03/15/2020 and underwent  Procedure(s): TOTAL KNEE ARTHROPLASTY.    Patient was given perioperative antibiotics:  Anti-infectives (From admission, onward)   Start     Dose/Rate Route Frequency Ordered Stop   03/15/20 1100  ceFAZolin (ANCEF) IVPB 1 g/50 mL premix        1 g 100 mL/hr over 30 Minutes Intravenous  Once 03/15/20 1059     03/15/20 0715  ceFAZolin (ANCEF) IVPB 2g/100 mL premix        2 g 200 mL/hr over 30 Minutes Intravenous On call to O.R. 03/15/20 4098 03/15/20 0925       Patient was given sequential compression devices, early ambulation, and chemoprophylaxis to prevent DVT.  Patient benefited maximally from hospital stay and there were no complications.    Recent vital signs:  Patient Vitals for the past 24 hrs:  BP Temp Temp src Pulse Resp SpO2 Height Weight  03/16/20 0525 136/70 98.6 F (37 C) -- 84 18 90 % -- --  03/16/20 0521 127/70 98.1 F (36.7 C) -- 78 16 98 % -- --  03/16/20 0128 -- -- -- -- -- -- -- 92.7 kg  03/16/20 0124  129/80 98.4 F (36.9 C) -- 79 16 97 % -- --  03/15/20 1958 (!) 142/83 99 F (37.2 C) -- 90 18 96 % -- --  03/15/20 1915 -- -- -- -- -- -- 5\' 1"  (1.549 m) --  03/15/20 1856 129/74 98.3 F (36.8 C) -- 87 17 90 % -- --  03/15/20 1806 136/84 98.8 F (37.1 C) Oral 88 16 94 % -- --  03/15/20 1659 125/79 98.8 F (37.1 C) Oral 85 16 95 % -- --  03/15/20 1500 137/89 -- -- 92 -- 92 % -- --  03/15/20 1345 -- -- -- 89 -- 93 % -- --  03/15/20 1305 (!) 145/95 98.4 F (36.9 C) -- -- -- 93 % -- --  03/15/20 1300 (!) 153/92 98.2 F (36.8 C) -- 96 14 91 % -- --  03/15/20 1245 (!) 150/87 -- -- 98 13 93 % -- --  03/15/20 1230 (!) 147/94 98.3 F (36.8 C) -- 100 20 95 % -- --  03/15/20 1215 (!) 145/86 -- -- 100 19 94 % -- --  03/15/20 1200 (!) 156/97 -- -- 95 18 96 % -- --  03/15/20 1145 (!) 164/94 -- -- 94 18 100 % -- --  03/15/20 1130 (!) 150/94 -- -- 95 20 99 % -- --  03/15/20 1115 (!) 174/82 -- -- 96 (!) 23 100 % -- --  03/15/20 1112 (!) 174/92 97.7 F (36.5 C) -- 99 15 100 % -- --     Recent laboratory studies:  Recent Labs    03/16/20 0308  WBC 12.1*  HGB 10.1*  HCT 31.3*  PLT 239     Discharge Medications:   Allergies as of 03/16/2020   No Known Allergies     Medication List    TAKE these medications   ALPRAZolam 0.5 MG tablet Commonly known as: XANAX TAKE 1 TABLET (0.5 MG TOTAL) BY MOUTH 2 (TWO) TIMES DAILY AS NEEDED FOR ANXIETY.   ascorbic acid 1000 MG tablet Commonly known as: VITAMIN C Take 1,000 mg by mouth daily.   benazepril-hydrochlorthiazide 10-12.5 MG tablet Commonly known as: LOTENSIN HCT TAKE 1 TABLET BY MOUTH EVERY DAY   fluticasone 50 MCG/ACT nasal spray Commonly known as: FLONASE Place 2 sprays into both nostrils daily.   ibuprofen 800 MG tablet Commonly known as: ADVIL TAKE 1 TABLET BY MOUTH THREE TIMES A DAY AS NEEDED What changed: reasons to take this   multivitamin with minerals Tabs tablet Take 1 tablet by mouth daily.   predniSONE 10 MG  (21) Tbpk tablet Commonly known as: STERAPRED UNI-PAK 21 TAB Take by mouth daily. Take 6 tabs by mouth daily  for 2 days, then 5 tabs for 2 days, then 4 tabs for 2 days, then 3 tabs for 2 days, 2 tabs for 2 days, then 1 tab by mouth daily for 2 days   Suboxone 8-2 MG Film Generic drug: Buprenorphine HCl-Naloxone HCl Place 1 Film under the tongue 2 (two) times daily.   venlafaxine XR 75 MG 24 hr capsule Commonly known as: EFFEXOR-XR TAKE 1 CAPSULE DAILY WITH BREAKFAST. TAKE IN COMBINATION WITH 150MG  FOR A TOTAL OF 225MG  DAILY What changed: See the new instructions.   venlafaxine XR 150 MG 24 hr capsule Commonly known as: EFFEXOR-XR TAKE 1 CAPSULE (150 MG TOTAL) BY MOUTH DAILY WITH BREAKFAST. What changed: See the new instructions.   Vitamin D (Ergocalciferol) 1.25 MG (50000 UNIT) Caps capsule Commonly known as: DRISDOL Take 1 capsule (50,000 Units total) by mouth every 7 (seven) days.            Durable Medical Equipment  (From admission, onward)         Start     Ordered   03/15/20 1657  DME Walker rolling  Once       Question:  Patient needs a walker to treat with the following condition  Answer:  Primary osteoarthritis of right knee   03/15/20 1657   03/15/20 1657  DME 3 n 1  Once        03/15/20 1657   03/15/20 1657  DME Walker rolling  Once       Question:  Patient needs a walker to treat with the following condition  Answer:  Primary osteoarthritis of right knee   03/15/20 1657   03/15/20 1657  DME 3 n 1  Once        03/15/20 1657   03/15/20 1352  DME 3-in-1  Once        03/15/20 1355           Discharge Care Instructions  (From admission, onward)         Start     Ordered   03/16/20 0000  Weight bearing as tolerated        03/16/20 0824   03/15/20 0000  Leave dressing on - Keep it clean, dry, and  intact until clinic visit       Comments: Keep the waterproof bandage in place until we see you in clinic in 2 weeks. You may remove the Ace bandage in 2  days.   03/15/20 1355          Diagnostic Studies: DG Chest 2 View  Result Date: 03/13/2020 CLINICAL DATA:  Preop.  Knee replacement. EXAM: CHEST - 2 VIEW COMPARISON:  April 14, 2015 FINDINGS: The heart size and mediastinal contours are within normal limits. Both lungs are clear. No pleural effusions or pneumothorax. No acute osseous abnormality. Right upper quadrant clips. IMPRESSION: No acute cardiopulmonary disease. Electronically Signed   By: Feliberto Harts MD   On: 03/13/2020 08:46    Disposition: Discharge disposition: 01-Home or Self Care       Discharge Instructions    Call MD / Call 911   Complete by: As directed    If you experience chest pain or shortness of breath, CALL 911 and be transported to the hospital emergency room.  If you develope a fever above 101 F, pus (white drainage) or increased drainage or redness at the wound, or calf pain, call your surgeon's office.   Call MD for:  difficulty breathing, headache or visual disturbances   Complete by: As directed    Call MD for:  persistant nausea and vomiting   Complete by: As directed    Call MD for:  severe uncontrolled pain   Complete by: As directed    Call MD for:  temperature >100.4   Complete by: As directed    Constipation Prevention   Complete by: As directed    Drink plenty of fluids.  Prune juice may be helpful.  You may use a stool softener, such as Colace (over the counter) 100 mg twice a day.  Use MiraLax (over the counter) for constipation as needed.   Diet - low sodium heart healthy   Complete by: As directed    Discharge instructions   Complete by: As directed    -You have an Ace bandage on your surgical leg.  Keep this in place for 2 days.  You may remove it on day 3 after surgery. -You have a waterproof bandage on your Ace wrap.  Please keep this in place until we see you back in clinic in 2 weeks. -Do not apply lotions, creams or powders on or near the bandage. -You may bear weight on  her surgical leg beginning today. -You should elevate and ice your knee several times a day to help with swelling and pain. -Use your CPM machine as instructed.   Driving Restrictions   Complete by: As directed    Do not drive a vehicle until we follow-up with you in clinic in 2 weeks.   Driving restrictions   Complete by: As directed    No driving for 2 weeks   Increase activity slowly   Complete by: As directed    Increase activity slowly as tolerated   Complete by: As directed    Leave dressing on - Keep it clean, dry, and intact until clinic visit   Complete by: As directed    Keep the waterproof bandage in place until we see you in clinic in 2 weeks. You may remove the Ace bandage in 2 days.   Patient may shower   Complete by: As directed    You may shower without a dressing once there is no drainage.  Do not wash over the wound.  If drainage remains, cover wound with plastic wrap and then shower.   Weight bearing as tolerated   Complete by: As directed        Follow-up Information    Jodi Geralds, MD In 2 weeks.   Specialty: Orthopedic Surgery Why: Your postoperative clinic follow-up is scheduled for 03/28/2020 at 11 AM. Contact information: [address] [place] [address] [phone]                Signed: Dannielle Burn 03/16/2020, 8:24 AM

## 2020-03-18 ENCOUNTER — Encounter (HOSPITAL_COMMUNITY): Payer: Self-pay | Admitting: Orthopedic Surgery

## 2020-03-19 LAB — BPAM RBC
Blood Product Expiration Date: 202111052359
Blood Product Expiration Date: 202111062359
Unit Type and Rh: 5100
Unit Type and Rh: 5100

## 2020-03-19 LAB — TYPE AND SCREEN
ABO/RH(D): A POS
Antibody Screen: POSITIVE
Donor AG Type: NEGATIVE
Donor AG Type: NEGATIVE
Unit division: 0
Unit division: 0

## 2020-03-20 ENCOUNTER — Telehealth (INDEPENDENT_AMBULATORY_CARE_PROVIDER_SITE_OTHER): Payer: Commercial Managed Care - PPO | Admitting: Family Medicine

## 2020-03-20 ENCOUNTER — Encounter: Payer: Self-pay | Admitting: Family Medicine

## 2020-03-20 DIAGNOSIS — B9689 Other specified bacterial agents as the cause of diseases classified elsewhere: Secondary | ICD-10-CM | POA: Diagnosis not present

## 2020-03-20 DIAGNOSIS — J329 Chronic sinusitis, unspecified: Secondary | ICD-10-CM | POA: Diagnosis not present

## 2020-03-20 MED ORDER — AMOXICILLIN 875 MG PO TABS: 875.0000 mg | ORAL_TABLET | Freq: Two times a day (BID) | ORAL | 0 refills | Status: DC

## 2020-03-20 NOTE — Progress Notes (Signed)
Virtual Visit via Video   I connected with patient on 03/20/20 at  9:30 AM EDT by a video enabled telemedicine application and verified that I am speaking with the correct person using two identifiers.  Location patient: Home Location provider: [facility] [facility] Persons participating in the virtual visit: Patient, Provider, CMA (Sabrina M)  I discussed the limitations of evaluation and management by telemedicine and the availability of in person appointments. The patient expressed understanding and agreed to proceed.  Subjective:   HPI:   URI- pt had knee surgery last Friday.  'I feel so bad'- weak, sinus drainage, 'my head has a ton of bricks in it'.  No fever.  + facial pain/pressure.  No tooth pain.  No ear pain.  Pt has pain when leaning forward.  + PND causing cough- productive.  + decreased appetite.  Denies N/V.  No known sick contacts.  sxs started Monday and have worsened over the last 2 days.  No relief w/ Excedrin migraine.  Not currently taking allergy medication.    ROS:   See pertinent positives and negatives per HPI.  Patient Active Problem List   Diagnosis Date Noted  . Primary osteoarthritis of right knee 03/15/2020  . Status post total knee replacement, right 03/15/2020  . Incontinence of feces   . Obesity (BMI 30-39.9) 07/12/2018  . Allergic rhinitis 07/22/2017  . Tear of medial meniscus of right knee, current 03/03/2017  . Tear of lateral meniscus of right knee, current 03/03/2017  . Vitamin D deficiency 12/04/2016  . Baker's cyst of knee, right 05/21/2016  . Edema 12/03/2015  . Liver fibrosis 07/25/2015  . S/P laparoscopic cholecystectomy 11/25/2014  . Knee pain, right 07/17/2014  . Physical exam 07/17/2014  . Pap smear for cervical cancer screening 07/17/2014  . Hyperlipidemia 01/12/2014  . History of hepatitis C 04/27/2013  . 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 Hypertension   . Depression     Social History   Tobacco Use    . Smoking status: Former Smoker    Packs/day: 1.00    Years: 35.00    Pack years: 35.00    Types: E-cigarettes    Start date: 05/25/1994    Quit date: 03/03/2016    Years since quitting: 4.0  . Smokeless tobacco: Never Used  Substance Use Topics  . Alcohol use: No    Alcohol/week: 0.0 standard drinks    Comment: "last alcohol was in the 1990's"    Current Outpatient Medications:  .  ALPRAZolam (XANAX) 0.5 MG tablet, TAKE 1 TABLET (0.5 MG TOTAL) BY MOUTH 2 (TWO) TIMES DAILY AS NEEDED FOR ANXIETY., Disp: 60 tablet, Rfl: 0 .  ascorbic acid (VITAMIN C) 1000 MG tablet, Take 1,000 mg by mouth daily. , Disp: , Rfl:  .  benazepril-hydrochlorthiazide (LOTENSIN HCT) 10-12.5 MG tablet, TAKE 1 TABLET BY MOUTH EVERY DAY (Patient taking differently: Take 1 tablet by mouth daily. ), Disp: 90 tablet, Rfl: 1 .  ibuprofen (ADVIL) 800 MG tablet, TAKE 1 TABLET BY MOUTH THREE TIMES A DAY AS NEEDED (Patient taking differently: Take 800 mg by mouth 3 (three) times daily as needed for moderate pain. ), Disp: 90 tablet, Rfl: 1 .  Multiple Vitamin (MULTIVITAMIN WITH MINERALS) TABS tablet, Take 1 tablet by mouth daily., Disp: , Rfl:  .  SUBOXONE 8-2 MG FILM, Place 1 Film under the tongue 2 (two) times daily. , Disp: , Rfl:  .  venlafaxine XR (EFFEXOR-XR) 150 MG 24 hr capsule, TAKE 1  CAPSULE (150 MG TOTAL) BY MOUTH DAILY WITH BREAKFAST. (Patient taking differently: Take 150 mg by mouth daily with breakfast. ), Disp: 90 capsule, Rfl: 0 .  venlafaxine XR (EFFEXOR-XR) 75 MG 24 hr capsule, TAKE 1 CAPSULE DAILY WITH BREAKFAST. TAKE IN COMBINATION WITH 150MG  FOR A TOTAL OF 225MG  DAILY (Patient taking differently: Take 75 mg by mouth daily with breakfast. ), Disp: 90 capsule, Rfl: 1 .  fluticasone (FLONASE) 50 MCG/ACT nasal spray, Place 2 sprays into both nostrils daily. (Patient not taking: Reported on 02/29/2020), Disp: 16 g, Rfl: 6 .  predniSONE (STERAPRED UNI-PAK 21 TAB) 10 MG (21) TBPK tablet, Take by mouth daily. Take 6  tabs by mouth daily  for 2 days, then 5 tabs for 2 days, then 4 tabs for 2 days, then 3 tabs for 2 days, 2 tabs for 2 days, then 1 tab by mouth daily for 2 days (Patient not taking: Reported on 02/29/2020), Disp: 42 tablet, Rfl: 0 .  Vitamin D, Ergocalciferol, (DRISDOL) 1.25 MG (50000 UNIT) CAPS capsule, Take 1 capsule (50,000 Units total) by mouth every 7 (seven) days. (Patient not taking: Reported on 02/29/2020), Disp: 12 capsule, Rfl: 0  No Known Allergies  Objective:   There were no vitals taken for this visit. AAOx3, NAD NCAT, EOMI No obvious CN deficits Coloring WNL Pt is able to speak clearly, coherently without shortness of breath or increased work of breathing.  Thought process is linear.  Pt is tearful   Assessment and Plan:   Bacterial sinusitis- new.  Pt had knee surgery last week and since Monday has been feeling 'really bad'.  She reports this feels like previous sinus infxns and sxs have been worsening each day.  + HA, congestion, facial pain, cough.  No fever or body aches.  No known sick contacts.  Will start Amoxicillin and encouraged pt to add OTC Claritin or Zyrtec until feeling better.  Reviewed supportive care and red flags that should prompt return.  Pt expressed understanding and is in agreement w/ plan.    04/30/2020, MD 03/20/2020

## 2020-03-29 ENCOUNTER — Telehealth: Payer: Self-pay | Admitting: Family Medicine

## 2020-03-29 NOTE — Telephone Encounter (Signed)
Patient called and said that she had a virtual visit last week for her sinuses and that she has one more day of antibiotic but she is still experiencing symptoms and doesn't feel like they are improving.  Please Advise.

## 2020-03-29 NOTE — Telephone Encounter (Signed)
Patient had a virtual appt last week with you and her symptoms are not improving after finishing the antibiotic. Please advise!

## 2020-04-01 ENCOUNTER — Telehealth: Payer: Self-pay

## 2020-04-01 NOTE — Telephone Encounter (Signed)
If her symptoms are not improving after antibiotics, they are most likely a viral/allergy combination.  If not already, she needs to be taking a daily antihistamine like Claritin or Zyrtec and using a nasal spray like Flonase daily.  She needs to drink plenty of fluids.  If she hasn't done so, she needs to be tested for COVID.  And we can always do another video visit to reassess her symptoms

## 2020-04-01 NOTE — Telephone Encounter (Signed)
Spoke with patient about lingering symptoms. Patient voiced understanding. No other concerns at this time.

## 2020-04-01 NOTE — Telephone Encounter (Signed)
Spoke with patient. She voiced understanding.

## 2020-04-25 ENCOUNTER — Other Ambulatory Visit: Payer: Self-pay | Admitting: Family Medicine

## 2020-04-25 NOTE — Telephone Encounter (Signed)
Last refill: 02/12/20 #60, 0 Last OV: 10/17/19 dx. CPE

## 2020-04-27 ENCOUNTER — Other Ambulatory Visit: Payer: Self-pay | Admitting: Family Medicine

## 2020-05-05 ENCOUNTER — Other Ambulatory Visit: Payer: Self-pay | Admitting: Family Medicine

## 2020-06-04 ENCOUNTER — Other Ambulatory Visit: Payer: Self-pay | Admitting: Emergency Medicine

## 2020-06-04 DIAGNOSIS — F331 Major depressive disorder, recurrent, moderate: Secondary | ICD-10-CM

## 2020-06-04 MED ORDER — VENLAFAXINE HCL ER 75 MG PO CP24: 75.0000 mg | ORAL_CAPSULE | Freq: Every day | ORAL | 0 refills | Status: DC

## 2020-06-04 MED ORDER — VENLAFAXINE HCL ER 150 MG PO CP24: 150.0000 mg | ORAL_CAPSULE | Freq: Every day | ORAL | 0 refills | Status: DC

## 2020-06-26 ENCOUNTER — Other Ambulatory Visit: Payer: Self-pay | Admitting: Family Medicine

## 2020-06-26 NOTE — Telephone Encounter (Signed)
Requesting: Xanax 0.5mg  Contract: UDS: Last Visit:03/20/20 v/v Next Visit:n/a Last Refill:04/26/20 60 tabs 0 refills Please Advise

## 2020-07-30 ENCOUNTER — Other Ambulatory Visit: Payer: Self-pay | Admitting: Family Medicine

## 2020-08-07 ENCOUNTER — Ambulatory Visit: Payer: Commercial Managed Care - PPO | Admitting: Family Medicine

## 2020-08-12 ENCOUNTER — Other Ambulatory Visit: Payer: Self-pay

## 2020-08-12 ENCOUNTER — Encounter: Payer: Self-pay | Admitting: Family Medicine

## 2020-08-12 ENCOUNTER — Ambulatory Visit: Payer: Commercial Managed Care - PPO | Admitting: Family Medicine

## 2020-08-12 VITALS — BP 126/70 | HR 72 | Temp 98.9°F | Resp 18 | Ht 61.0 in | Wt 178.0 lb

## 2020-08-12 DIAGNOSIS — M5442 Lumbago with sciatica, left side: Secondary | ICD-10-CM | POA: Diagnosis not present

## 2020-08-12 DIAGNOSIS — R159 Full incontinence of feces: Secondary | ICD-10-CM

## 2020-08-12 MED ORDER — MELOXICAM 15 MG PO TABS: 15.0000 mg | ORAL_TABLET | Freq: Every day | ORAL | 0 refills | Status: DC

## 2020-08-12 NOTE — Patient Instructions (Addendum)
Schedule your complete physical for after 5/21 START the Meloxicam once daily- take with food.  This is in place of the ibuprofen Use acetaminophen (tylenol) as needed for breakthrough pain Use a heating pad for pain relief We'll call you with your ortho appt Call with any questions or concerns Hang in there!

## 2020-08-12 NOTE — Progress Notes (Signed)
   Subjective:    Patient ID: Vanessa [name], female    DOB: [birth year], 60 y.o.   MRN: [id number]  HPI LBP- pt does a lot of bending and pulling at her job.  'it's hurting my back real bad'.  Pt has hx of rectal weakness and needs PT to correct this.  She has not been able to afford the PT and when she does the bending and pulling- she has fecal incontinence.  She is having to change clothes multiple times at work.  Pt needs a note to allow workplace accommodations for her back and fecal incontinence.  Pt reports pain is bandlike across lower back.  Pain will occasionally radiate into buttocks and thighs.  L leg feels numb and tingly.     Review of Systems For ROS see HPI   This visit occurred during the SARS-CoV-2 public health emergency.  Safety protocols were in place, including screening questions prior to the visit, additional usage of staff PPE, and extensive cleaning of exam room while observing appropriate contact time as indicated for disinfecting solutions.       Objective:   Physical Exam Vitals reviewed.  Constitutional:      General: She is not in acute distress.    Appearance: Normal appearance. She is not ill-appearing.  HENT:     Head: Normocephalic and atraumatic.  Eyes:     Extraocular Movements: Extraocular movements intact.     Conjunctiva/sclera: Conjunctivae normal.     Pupils: Pupils are equal, round, and reactive to light.  Cardiovascular:     Pulses: Normal pulses.  Musculoskeletal:        General: Tenderness (TTP in bandlike pattern across lower back) present.  Skin:    General: Skin is warm and dry.     Findings: No rash.  Neurological:     General: No focal deficit present.     Mental Status: She is alert and oriented to person, place, and time.     Cranial Nerves: No cranial nerve deficit.     Coordination: Coordination normal.     Gait: Gait normal.     Comments: + SLR, L>R  Psychiatric:        Mood and Affect: Mood normal.        Behavior:  Behavior normal.        Thought Content: Thought content normal.           Assessment & Plan:  Low back pain- new.  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  She will have pain that radiates down predominately L leg and numbness and tingling.  Start daily Meloxicam.  Refer to ortho.  Reviewed supportive care and red flags that should prompt return.  Pt expressed understanding and is in agreement w/ plan.   Fecal incontinence- pt has not been able to afford the necessary PT to strengthen her rectal muscles.  The physicality of her work has led to multiple episodes of incontinence which requires her to change clothes multiple times per shift and led to considerable emotional distress.  Note provided to allow work accommodations.  Will follow.

## 2020-08-20 ENCOUNTER — Ambulatory Visit: Payer: Commercial Managed Care - PPO | Admitting: Family Medicine

## 2020-08-23 ENCOUNTER — Encounter: Payer: Self-pay | Admitting: Family Medicine

## 2020-08-23 ENCOUNTER — Other Ambulatory Visit: Payer: Self-pay

## 2020-08-23 ENCOUNTER — Telehealth (INDEPENDENT_AMBULATORY_CARE_PROVIDER_SITE_OTHER): Payer: Commercial Managed Care - PPO | Admitting: Family Medicine

## 2020-08-23 DIAGNOSIS — J00 Acute nasopharyngitis [common cold]: Secondary | ICD-10-CM | POA: Diagnosis not present

## 2020-08-23 NOTE — Progress Notes (Signed)
Virtual Visit via Video Note  Subjective  CC:  Chief Complaint  Patient presents with  . Sinusitis    Symptoms started earlier in the week - experiencing sinus drainage, headache, sore throat. No recent COVID tests     Fax [phone] attn: charge nurse, [facility]  Same day acute visit; PCP not available. New pt to me. Chart reviewed.   I connected with Jana Hakim on 08/23/20 at  8:30 AM EDT by a video enabled telemedicine application and verified that I am speaking with the correct person using two identifiers. Location patient: Home Location provider: [facility] [address], Office Persons participating in the virtual visit: Zamora Beaupre, Willow Ora, MD Adela Glimpse CMA  I discussed the limitations of evaluation and management by telemedicine and the availability of in person appointments. The patient expressed understanding and agreed to proceed. Video quality of visit was poor due to poor connection.  HPI: Vanessa [name] is a 60 y.o. female who was contacted today to address the problems listed above in the chief complaint.  60 year old female complains of 2 to 3-day history of URI symptoms including nasal congestion, postnasal drip, secondary sore throat, mild dry cough, and mild headache.  She denies fevers, chills, sinus pain, dental pain, shortness of breath, myalgias or GI symptoms.  She is vaccinated against COVID.  She has not yet tested for Covid.  Because of her symptoms including sore throat and headache, she requests a work note for today.  Assessment  1. Acute nasopharyngitis      Plan   viral URI: Education and supportive care recommended.  Mucinex DM, avoid decongestants due to hypertension.  Advil for headache.  Plenty fluids and rest.  Work note for today and fax to her employer.  Recommend COVID testing prior to returning to work.  Patient to notify her employer.  Follow-up if sinus pain develops dental pain  develops, fever or persistent symptoms greater than 7 to 10 days. I discussed the assessment and treatment plan with the patient. The patient was provided an opportunity to ask questions and all were answered. The patient agreed with the plan and demonstrated an understanding of the instructions.   The patient was advised to call back or seek an in-person evaluation if the symptoms worsen or if the condition fails to improve as anticipated. Follow up: As needed Visit date not found  No orders of the defined types were placed in this encounter.     I reviewed the patients updated PMH, FH, and SocHx.    Patient Active Problem List   Diagnosis Date Noted  . Primary osteoarthritis of right knee 03/15/2020  . Status post total knee replacement, right 03/15/2020  . Incontinence of feces   . Obesity (BMI 30-39.9) 07/12/2018  . Allergic rhinitis 07/22/2017  . Tear of medial meniscus of right knee, current 03/03/2017  . Tear of lateral meniscus of right knee, current 03/03/2017  . Vitamin D deficiency 12/04/2016  . Baker's cyst of knee, right 05/21/2016  . Edema 12/03/2015  . Liver fibrosis 07/25/2015  . S/P laparoscopic cholecystectomy 11/25/2014  . Knee pain, right 07/17/2014  . Physical exam 07/17/2014  . Pap smear for cervical cancer screening 07/17/2014  . Hyperlipidemia 01/12/2014  . History of hepatitis C 04/27/2013  . 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 Hypertension   . Depression    Current Meds  Medication Sig  . ALPRAZolam (XANAX) 0.5 MG tablet TAKE  1 TABLET BY MOUTH 2 TIMES DAILY AS NEEDED FOR ANXIETY.  Marland Kitchen ascorbic acid (VITAMIN C) 1000 MG tablet Take 1,000 mg by mouth daily.   . benazepril-hydrochlorthiazide (LOTENSIN HCT) 10-12.5 MG tablet TAKE 1 TABLET BY MOUTH EVERY DAY  . meloxicam (MOBIC) 15 MG tablet Take 1 tablet (15 mg total) by mouth daily.  . Multiple Vitamin (MULTIVITAMIN WITH MINERALS) TABS tablet Take 1 tablet by mouth daily.  . SUBOXONE 8-2 MG  FILM Place 1 Film under the tongue 2 (two) times daily.   Marland Kitchen venlafaxine XR (EFFEXOR-XR) 150 MG 24 hr capsule Take 1 capsule (150 mg total) by mouth daily with breakfast.  . venlafaxine XR (EFFEXOR-XR) 75 MG 24 hr capsule Take 1 capsule (75 mg total) by mouth daily with breakfast.  . Vitamin D, Ergocalciferol, (DRISDOL) 1.25 MG (50000 UNIT) CAPS capsule Take 1 capsule (50,000 Units total) by mouth every 7 (seven) days.    Allergies: Patient has No Known Allergies. Family History: Patient family history includes Colon cancer (age of onset: 73) in her brother; Diabetes in her mother and sister; Drug abuse in her brother; Esophageal cancer (age of onset: 38) in her brother; Hypertension in her mother and sister. Social History:  Patient  reports that she quit smoking about 4 years ago. Her smoking use included e-cigarettes. She started smoking about 26 years ago. She has a 35.00 pack-year smoking history. She has never used smokeless tobacco. She reports previous drug use. Drugs: Marijuana and "Crack" cocaine. She reports that she does not drink alcohol.  Review of Systems: Constitutional: Negative for fever malaise or anorexia Cardiovascular: negative for chest pain Respiratory: negative for SOB or persistent cough Gastrointestinal: negative for abdominal pain  OBJECTIVE Vitals: There were no vitals taken for this visit. General: no acute distress , A&Ox3, nontoxic, no respiratory distress  Willow Ora, MD

## 2020-08-26 ENCOUNTER — Telehealth: Payer: Self-pay | Admitting: Family Medicine

## 2020-08-26 NOTE — Telephone Encounter (Signed)
Patient had a virtual visit 08/23/2020 with Dr. Asencion Partridge - for a sinus infection.  Patient states that she was not given an antibiotic and states that she is still having the same symptoms and would like to know if Dr. Beverely Low would send her in an antibiotic.  Please advise

## 2020-08-26 NOTE — Telephone Encounter (Signed)
Patient did not receive anything for symptoms after virtual visit. Can we send her in something?

## 2020-08-26 NOTE — Telephone Encounter (Signed)
Called and spoke with patient about concerns. Patient understood and will call back later this week if the flonase and allergy meds do not help.

## 2020-08-26 NOTE — Telephone Encounter (Signed)
Before I send antibiotics, I need to make sure she is taking a daily allergy medication (Claritin/Allegra/Zyrtec) and she should be using a daily nasal spray like Flonase.  She should use those for a few days and then if no better we can talk antibiotics.

## 2020-08-28 ENCOUNTER — Encounter: Payer: Self-pay | Admitting: Family Medicine

## 2020-08-28 ENCOUNTER — Other Ambulatory Visit: Payer: Self-pay | Admitting: Family Medicine

## 2020-08-28 ENCOUNTER — Telehealth (INDEPENDENT_AMBULATORY_CARE_PROVIDER_SITE_OTHER): Payer: Commercial Managed Care - PPO | Admitting: Family Medicine

## 2020-08-28 DIAGNOSIS — J329 Chronic sinusitis, unspecified: Secondary | ICD-10-CM | POA: Diagnosis not present

## 2020-08-28 DIAGNOSIS — Z20822 Contact with and (suspected) exposure to covid-19: Secondary | ICD-10-CM | POA: Diagnosis not present

## 2020-08-28 DIAGNOSIS — B9689 Other specified bacterial agents as the cause of diseases classified elsewhere: Secondary | ICD-10-CM

## 2020-08-28 MED ORDER — BENZONATATE 200 MG PO CAPS: 200.0000 mg | ORAL_CAPSULE | Freq: Two times a day (BID) | ORAL | 0 refills | Status: DC | PRN

## 2020-08-28 MED ORDER — AMOXICILLIN 875 MG PO TABS: 875.0000 mg | ORAL_TABLET | Freq: Two times a day (BID) | ORAL | 0 refills | Status: DC

## 2020-08-28 NOTE — Progress Notes (Signed)
I connected with  Vanessa [name] on 08/28/20 by a video enabled telemedicine application and verified that I am speaking with the correct person using two identifiers.   I discussed the limitations of evaluation and management by telemedicine. The patient expressed understanding and agreed to proceed.

## 2020-08-28 NOTE — Progress Notes (Signed)
Virtual Visit via Video   I connected with patient on 08/28/20 at  9:30 AM EDT by a video enabled telemedicine application and verified that I am speaking with the correct person using two identifiers.  Location patient: Home Location provider: [facility], Office Persons participating in the virtual visit: Patient, Provider, CMA (Sabrina M)  I discussed the limitations of evaluation and management by telemedicine and the availability of in person appointments. The patient expressed understanding and agreed to proceed.  Subjective:   HPI:   URI- 'I started off w/ the sinuses and I'm still having problems'.  Hasn't been able to go to work.  Not eating.  + head pressure, 'I feel weak'.  No fever.  No N/V.  + HA.  + productive cough.  Sxs acutely worsened 4/1.  Has not had a COVID test.  Keeps repeating, 'I feel so bad'.  ROS:   See pertinent positives and negatives per HPI.  Patient Active Problem List   Diagnosis Date Noted  . Primary osteoarthritis of right knee 03/15/2020  . Status post total knee replacement, right 03/15/2020  . Incontinence of feces   . Obesity (BMI 30-39.9) 07/12/2018  . Allergic rhinitis 07/22/2017  . Tear of medial meniscus of right knee, current 03/03/2017  . Tear of lateral meniscus of right knee, current 03/03/2017  . Vitamin D deficiency 12/04/2016  . Baker's cyst of knee, right 05/21/2016  . Edema 12/03/2015  . Liver fibrosis 07/25/2015  . S/P laparoscopic cholecystectomy 11/25/2014  . Knee pain, right 07/17/2014  . Physical exam 07/17/2014  . Pap smear for cervical cancer screening 07/17/2014  . Hyperlipidemia 01/12/2014  . History of hepatitis C 04/27/2013  . 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 Hypertension   . Depression     Social History   Tobacco Use  . Smoking status: Former Smoker    Packs/day: 1.00    Years: 35.00    Pack years: 35.00    Types: E-cigarettes    Start date: 05/25/1994    Quit date:  03/03/2016    Years since quitting: 4.4  . Smokeless tobacco: Never Used  Substance Use Topics  . Alcohol use: No    Alcohol/week: 0.0 standard drinks    Comment: "last alcohol was in the 1990's"    Current Outpatient Medications:  .  ALPRAZolam (XANAX) 0.5 MG tablet, TAKE 1 TABLET BY MOUTH 2 TIMES DAILY AS NEEDED FOR ANXIETY., Disp: 60 tablet, Rfl: 0 .  ascorbic acid (VITAMIN C) 1000 MG tablet, Take 1,000 mg by mouth daily. , Disp: , Rfl:  .  benazepril-hydrochlorthiazide (LOTENSIN HCT) 10-12.5 MG tablet, TAKE 1 TABLET BY MOUTH EVERY DAY, Disp: 90 tablet, Rfl: 1 .  meloxicam (MOBIC) 15 MG tablet, Take 1 tablet (15 mg total) by mouth daily., Disp: 30 tablet, Rfl: 0 .  Multiple Vitamin (MULTIVITAMIN WITH MINERALS) TABS tablet, Take 1 tablet by mouth daily., Disp: , Rfl:  .  SUBOXONE 8-2 MG FILM, Place 1 Film under the tongue 2 (two) times daily. , Disp: , Rfl:  .  venlafaxine XR (EFFEXOR-XR) 150 MG 24 hr capsule, Take 1 capsule (150 mg total) by mouth daily with breakfast., Disp: 90 capsule, Rfl: 0 .  venlafaxine XR (EFFEXOR-XR) 75 MG 24 hr capsule, Take 1 capsule (75 mg total) by mouth daily with breakfast., Disp: 90 capsule, Rfl: 0 .  Vitamin D, Ergocalciferol, (DRISDOL) 1.25 MG (50000 UNIT) CAPS capsule, Take 1 capsule (50,000 Units total) by mouth every 7 (seven)  days. (Patient not taking: Reported on 08/28/2020), Disp: 12 capsule, Rfl: 0  No Known Allergies  Objective:   There were no vitals taken for this visit. AAOx3, NAD NCAT, EOMI No obvious CN deficits Pale Pt is able to speak clearly, coherently without shortness of breath or increased work of breathing.  No cough heard Thought process is linear.  Mood is appropriate.   Assessment and Plan:   Sinusitis- new.  Given pt's fatigue, lack of appetite, HA, sinus pressure, and cough I have concerns for COVID.  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  Encouraged her to get tested.  In the mean time, will start  Amoxicillin in case of bacterial infxn as sxs acutely worsened- possible second sickening.  Will provide note for work.  Reviewed supportive care and red flags that should prompt return.  Pt expressed understanding and is in agreement w/ plan.   Neena Rhymes, MD 08/28/2020

## 2020-08-29 NOTE — Telephone Encounter (Signed)
Requesting:Xanax 0.5mg  Contract: UDS: Last Visit:08/28/20 v/v Next Visit:n/a Last Refill:06/27/20 60 tabs 0 refills   Please Advise

## 2020-09-03 ENCOUNTER — Other Ambulatory Visit: Payer: Self-pay | Admitting: Family Medicine

## 2020-09-03 DIAGNOSIS — F331 Major depressive disorder, recurrent, moderate: Secondary | ICD-10-CM

## 2020-09-09 ENCOUNTER — Other Ambulatory Visit: Payer: Self-pay | Admitting: Family Medicine

## 2020-09-09 DIAGNOSIS — F331 Major depressive disorder, recurrent, moderate: Secondary | ICD-10-CM

## 2020-09-11 ENCOUNTER — Other Ambulatory Visit: Payer: Self-pay | Admitting: Family Medicine

## 2020-09-11 DIAGNOSIS — F331 Major depressive disorder, recurrent, moderate: Secondary | ICD-10-CM

## 2020-10-24 ENCOUNTER — Other Ambulatory Visit: Payer: Self-pay | Admitting: Family Medicine

## 2020-11-04 ENCOUNTER — Other Ambulatory Visit: Payer: Self-pay

## 2020-11-04 ENCOUNTER — Ambulatory Visit (INDEPENDENT_AMBULATORY_CARE_PROVIDER_SITE_OTHER): Payer: Commercial Managed Care - PPO | Admitting: Family Medicine

## 2020-11-04 ENCOUNTER — Encounter: Payer: Self-pay | Admitting: Family Medicine

## 2020-11-04 VITALS — BP 130/88 | HR 79 | Temp 97.3°F | Resp 17 | Ht 61.5 in | Wt 172.8 lb

## 2020-11-04 DIAGNOSIS — Z0000 Encounter for general adult medical examination without abnormal findings: Secondary | ICD-10-CM

## 2020-11-04 DIAGNOSIS — E669 Obesity, unspecified: Secondary | ICD-10-CM

## 2020-11-04 DIAGNOSIS — Z1231 Encounter for screening mammogram for malignant neoplasm of breast: Secondary | ICD-10-CM | POA: Diagnosis not present

## 2020-11-04 DIAGNOSIS — F331 Major depressive disorder, recurrent, moderate: Secondary | ICD-10-CM

## 2020-11-04 DIAGNOSIS — E559 Vitamin D deficiency, unspecified: Secondary | ICD-10-CM

## 2020-11-04 LAB — CBC WITH DIFFERENTIAL/PLATELET
Basophils Absolute: 0.1 10*3/uL (ref 0.0–0.1)
Basophils Relative: 1.3 % (ref 0.0–3.0)
Eosinophils Absolute: 0.1 10*3/uL (ref 0.0–0.7)
Eosinophils Relative: 0.8 % (ref 0.0–5.0)
HCT: 43.8 % (ref 36.0–46.0)
Hemoglobin: 14.5 g/dL (ref 12.0–15.0)
Lymphocytes Relative: 24.8 % (ref 12.0–46.0)
Lymphs Abs: 2.2 10*3/uL (ref 0.7–4.0)
MCHC: 33.2 g/dL (ref 30.0–36.0)
MCV: 87 fl (ref 78.0–100.0)
Monocytes Absolute: 0.5 10*3/uL (ref 0.1–1.0)
Monocytes Relative: 5.3 % (ref 3.0–12.0)
Neutro Abs: 6.1 10*3/uL (ref 1.4–7.7)
Neutrophils Relative %: 67.8 % (ref 43.0–77.0)
Platelets: 287 10*3/uL (ref 150.0–400.0)
RBC: 5.03 Mil/uL (ref 3.87–5.11)
RDW: 16 % — ABNORMAL HIGH (ref 11.5–15.5)
WBC: 9.1 10*3/uL (ref 4.0–10.5)

## 2020-11-04 LAB — BASIC METABOLIC PANEL
BUN: 17 mg/dL (ref 6–23)
CO2: 28 mEq/L (ref 19–32)
Calcium: 9.4 mg/dL (ref 8.4–10.5)
Chloride: 98 mEq/L (ref 96–112)
Creatinine, Ser: 0.66 mg/dL (ref 0.40–1.20)
GFR: 96.11 mL/min (ref >60.00)
Glucose, Bld: 67 mg/dL — ABNORMAL LOW (ref 70–99)
Potassium: 4.2 mEq/L (ref 3.5–5.1)
Sodium: 137 mEq/L (ref 135–145)

## 2020-11-04 LAB — LIPID PANEL
Cholesterol: 187 mg/dL (ref 0–200)
HDL: 62.8 mg/dL (ref >39.00)
LDL Cholesterol: 103 mg/dL — ABNORMAL HIGH (ref 0–99)
NonHDL: 124.18
Total CHOL/HDL Ratio: 3
Triglycerides: 104 mg/dL (ref 0.0–149.0)
VLDL: 20.8 mg/dL (ref 0.0–40.0)

## 2020-11-04 LAB — HEPATIC FUNCTION PANEL
ALT: 19 U/L (ref 0–35)
AST: 19 U/L (ref 0–37)
Albumin: 4.4 g/dL (ref 3.5–5.2)
Alkaline Phosphatase: 69 U/L (ref 39–117)
Bilirubin, Direct: 0.1 mg/dL (ref 0.0–0.3)
Total Bilirubin: 0.3 mg/dL (ref 0.2–1.2)
Total Protein: 7.8 g/dL (ref 6.0–8.3)

## 2020-11-04 LAB — TSH: TSH: 1.85 u[IU]/mL (ref 0.35–4.50)

## 2020-11-04 LAB — VITAMIN D 25 HYDROXY (VIT D DEFICIENCY, FRACTURES): VITD: 20.26 ng/mL — ABNORMAL LOW (ref 30.00–100.00)

## 2020-11-04 NOTE — Assessment & Plan Note (Signed)
Pt is down 5 lbs since her last visit.  Applauded her efforts.  Check labs to risk stratify.  Will follow.

## 2020-11-04 NOTE — Patient Instructions (Signed)
Follow up in 6 months to recheck BP and cholesterol We'll notify you of your lab results and make any changes if needed Continue to work on healthy diet and regular exercise- you can do it! They will call you to schedule your mammogram Call [facility] office to reschedule- [phone] Call with any questions or concerns Stay Safe!  Stay Healthy!

## 2020-11-04 NOTE — Assessment & Plan Note (Signed)
Pt's PE WNL w/ exception of obesity.  Due for mammo- ordered.  UTD on colonoscopy, pap.  Check labs.  Anticipatory guidance provided.

## 2020-11-04 NOTE — Progress Notes (Signed)
   Subjective:    Patient ID: Vanessa [name], female    DOB: [birth year], 60 y.o.   MRN: [id number]  HPI CPE- UTD on pap, due for mammo, UTD on colonoscopy.  UTD on Tdap, COVID.  Reviewed past medical, surgical, family and social histories.   Patient Care Team    Relationship Specialty Notifications Start End  Sheliah Hatch, MD PCP - General Family Medicine  04/27/13    Comment: Talmadge Coventry, MD Referring Physician Anesthesiology  07/17/14   Rachael Fee, MD Attending Physician Gastroenterology  07/17/14     Health Maintenance  Topic Date Due   Zoster Vaccines- Shingrix (1 of 2) Never done   MAMMOGRAM  03/20/2021 (Originally 02/02/2020)   COVID-19 Vaccine (4 - Booster for Pfizer series) 05/24/2021 (Originally 10/03/2020)   INFLUENZA VACCINE  12/23/2020   PAP SMEAR-Modifier  07/12/2021   COLONOSCOPY (Pts 45-36yrs Insurance coverage will need to be confirmed)  11/15/2023   TETANUS/TDAP  08/04/2025   Hepatitis C Screening  Completed   HIV Screening  Completed   Pneumococcal Vaccine 28-13 Years old  Aged Out   HPV VACCINES  Aged Out      Review of Systems Patient reports no vision/ hearing changes, adenopathy,fever, weight change,  persistant/recurrent hoarseness , swallowing issues, chest pain, palpitations, edema, persistant/recurrent cough, hemoptysis, dyspnea (rest/exertional/paroxysmal nocturnal), gastrointestinal bleeding (melena, rectal bleeding), abdominal pain, significant heartburn, bowel changes, GU symptoms (dysuria, hematuria, incontinence), Gyn symptoms (abnormal  bleeding, pain),  syncope, focal weakness, memory loss, numbness & tingling, skin/hair/nail changes, abnormal bruising or bleeding, anxiety, or depression.   This visit occurred during the SARS-CoV-2 public health emergency.  Safety protocols were in place, including screening questions prior to the visit, additional usage of staff PPE, and extensive cleaning of exam room while observing appropriate  contact time as indicated for disinfecting solutions.      Objective:   Physical Exam General Appearance:    Alert, cooperative, no distress, appears stated age  Head:    Normocephalic, without obvious abnormality, atraumatic  Eyes:    PERRL, conjunctiva/corneas clear, EOM's intact, fundi    benign, both eyes  Ears:    Normal TM's and external ear canals, both ears  Nose:   Deferred due to COVID  Throat:   Neck:   Supple, symmetrical, trachea midline, no adenopathy;    Thyroid: no enlargement/tenderness/nodules  Back:     Symmetric, no curvature, ROM normal, no CVA tenderness  Lungs:     Clear to auscultation bilaterally, respirations unlabored  Chest Wall:    No tenderness or deformity   Heart:    Regular rate and rhythm, S1 and S2 normal, no murmur, rub   or gallop  Breast Exam:    Deferred to mammo  Abdomen:     Soft, non-tender, bowel sounds active all four quadrants,    no masses, no organomegaly  Genitalia:    Deferred  Rectal:    Extremities:   Extremities normal, atraumatic, no cyanosis or edema  Pulses:   2+ and symmetric all extremities  Skin:   Skin color, texture, turgor normal, no rashes or lesions  Lymph nodes:   Cervical, supraclavicular, and axillary nodes normal  Neurologic:   CNII-XII intact, normal strength, sensation and reflexes    throughout          Assessment & Plan:

## 2020-11-04 NOTE — Assessment & Plan Note (Signed)
Check labs and replete prn. 

## 2020-11-04 NOTE — Assessment & Plan Note (Signed)
Pt is currently well controlled on Effexor.  No changes at this time

## 2020-11-05 ENCOUNTER — Other Ambulatory Visit: Payer: Self-pay

## 2020-11-05 DIAGNOSIS — E559 Vitamin D deficiency, unspecified: Secondary | ICD-10-CM

## 2020-11-05 MED ORDER — VITAMIN D (ERGOCALCIFEROL) 1.25 MG (50000 UNIT) PO CAPS: 50000.0000 [IU] | ORAL_CAPSULE | ORAL | 0 refills | Status: DC

## 2020-11-05 NOTE — Progress Notes (Signed)
erg ?

## 2020-11-06 ENCOUNTER — Other Ambulatory Visit: Payer: Self-pay | Admitting: Family Medicine

## 2020-11-20 ENCOUNTER — Encounter: Payer: Self-pay | Admitting: *Deleted

## 2020-11-27 ENCOUNTER — Ambulatory Visit: Payer: Commercial Managed Care - PPO | Admitting: Family Medicine

## 2020-12-04 ENCOUNTER — Ambulatory Visit (INDEPENDENT_AMBULATORY_CARE_PROVIDER_SITE_OTHER): Payer: Commercial Managed Care - PPO | Admitting: Family Medicine

## 2020-12-04 ENCOUNTER — Encounter: Payer: Self-pay | Admitting: Family Medicine

## 2020-12-04 ENCOUNTER — Other Ambulatory Visit: Payer: Self-pay

## 2020-12-04 ENCOUNTER — Other Ambulatory Visit: Payer: Self-pay | Admitting: Family Medicine

## 2020-12-04 DIAGNOSIS — M545 Low back pain, unspecified: Secondary | ICD-10-CM | POA: Diagnosis not present

## 2020-12-04 DIAGNOSIS — G8929 Other chronic pain: Secondary | ICD-10-CM

## 2020-12-04 DIAGNOSIS — F331 Major depressive disorder, recurrent, moderate: Secondary | ICD-10-CM

## 2020-12-04 MED ORDER — BACLOFEN 10 MG PO TABS
5.0000 mg | ORAL_TABLET | Freq: Every evening | ORAL | 3 refills | Status: DC | PRN
Start: 2020-12-04 — End: 2023-07-09

## 2020-12-04 NOTE — Progress Notes (Signed)
Office Visit Note   Patient: Vanessa [name]           Date of Birth: 09/11/1960           MRN: [id number] Visit Date: 12/04/2020 Requested by: Sheliah Hatch, MD [address] [place],  [place] [address] PCP: Sheliah Hatch, MD  Subjective: Chief Complaint  Patient presents with   Lower Back - Pain    Pain across her lower back x 6 months. 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 Increased pain the day after each shift. No pain radiating down the legs. No numbness/tingling in the legs/feet.     HPI: She is here with low back pain.  Pain for about 6 months, no injury.  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  She works 10 to 12 hours per shift.  At the end of her shift she is in quite a bit of discomfort.  No radicular symptoms.  No bowel or bladder dysfunction.  She has tried ibuprofen with some relief.  Denies fevers or chills.  She is otherwise in pretty good health.  She unfortunately recently started smoking again.  Apparently her sister died, this has been understandably very distressing to the patient.                ROS:   All other systems were reviewed and are negative.  Objective: Vital Signs: There were no vitals taken for this visit.  Physical Exam:  General:  Alert and oriented, in no acute distress. Pulm:  Breathing unlabored. Psy:  Normal mood, congruent affect.  Low back: She has tenderness across the L5-S1 level in the midline and on both sides.  No pain over the sciatic notch.  Stork test negative, straight leg raise negative.  Pain is worse with forward flexion.  No pain with internal hip rotation.  Lower extremity strength and reflexes are normal.    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    Assessment &  Plan: Chronic low back pain, nonfocal neurologic exam. -We will try home exercises, I sent her a video with physical therapy program. -Baclofen as needed.  Glucosamine and turmeric over-the-counter.  Encouraged her to quit smoking if able. -Consider MRI scan if symptoms persist.     Procedures: No procedures performed        PMFS History: Patient Active Problem List   Diagnosis Date Noted   Primary osteoarthritis of right knee 03/15/2020   Status post total knee replacement, right 03/15/2020   Incontinence of feces    Obesity (BMI 30-39.9) 07/12/2018   Allergic rhinitis 07/22/2017   Tear of medial meniscus of right knee, current 03/03/2017   Tear of lateral meniscus of right knee, current 03/03/2017   Vitamin D deficiency 12/04/2016   Baker's cyst of knee, right 05/21/2016   Edema 12/03/2015   Liver fibrosis 07/25/2015   S/P laparoscopic cholecystectomy 11/25/2014   Knee pain, right 07/17/2014   Physical exam 07/17/2014   Pap smear for cervical cancer screening 07/17/2014   Hyperlipidemia 01/12/2014   History of hepatitis C 04/27/2013   Substance abuse in remission (HCC) 04/27/2013   Hypertension    Depression    Past Medical History:  Diagnosis Date   Anxiety  Arthritis    Chronic knee pain    Depression    Diverticulosis    Gall stone    Hepatitis C    Hx of Hepatitis C   Hypertension    Syphilis     Family History  Problem Relation Age of Onset   Hypertension Mother    Diabetes Mother    Drug abuse Brother    Esophageal cancer Brother 16   Colon cancer Brother 13   Hypertension Sister        x's 2   Diabetes Sister        oldest sister   Stomach cancer Neg Hx    Breast cancer Neg Hx    Rectal cancer Neg Hx     Past Surgical History:  Procedure Laterality Date   ANAL RECTAL MANOMETRY N/A 02/20/2019   Procedure: ANO RECTAL MANOMETRY;  Surgeon: Rachael Fee, MD;  Location: [facility];  Service: Endoscopy;  Laterality: N/A;   CARPAL TUNNEL  RELEASE Left    CHOLECYSTECTOMY N/A 11/24/2014   Procedure: LAPAROSCOPIC CHOLECYSTECTOMY WITH INTRAOPERATIVE CHOLANGIOGRAM;  Surgeon: Gaynelle Adu, MD;  Location: [facility];  Service: General;  Laterality: N/A;   ECTOPIC PREGNANCY SURGERY     TOTAL KNEE ARTHROPLASTY Right 03/15/2020   Procedure: TOTAL KNEE ARTHROPLASTY;  Surgeon: Jodi Geralds, MD;  Location: [facility];  Service: Orthopedics;  Laterality: Right;   Social History   Occupational History   Occupation: Dialysis Tech  Tobacco Use   Smoking status: Former    Packs/day: 1.00    Years: 35.00    Pack years: 35.00    Types: E-cigarettes, Cigarettes    Start date: 05/25/1994    Quit date: 03/03/2016    Years since quitting: 4.7   Smokeless tobacco: Never  Vaping Use   Vaping Use: Every day   Substances: Nicotine, Flavoring  Substance and Sexual Activity   Alcohol use: No    Alcohol/week: 0.0 standard drinks    Comment: "last alcohol was in the 1990's"   Drug use: Not Currently    Types: Marijuana, "Crack" cocaine    Comment: "last drug use was in the 1990's; never used heroin"   Sexual activity: Not Currently    Birth control/protection: Post-menopausal

## 2020-12-19 ENCOUNTER — Telehealth: Payer: Self-pay

## 2020-12-19 MED ORDER — ALPRAZOLAM 0.5 MG PO TABS: ORAL_TABLET | ORAL | 0 refills | Status: DC

## 2020-12-19 NOTE — Telephone Encounter (Signed)
Requesting:Xanax  Contract: UDS: Last Visit:11/04/20 Next Visit:n/a Last Refill:10/24/20 60 tabs 0 refills  Please Advise

## 2020-12-31 ENCOUNTER — Other Ambulatory Visit: Payer: Self-pay | Admitting: Family Medicine

## 2020-12-31 NOTE — Telephone Encounter (Signed)
LOV 11/04/20, medication not on current med list.

## 2021-02-04 ENCOUNTER — Telehealth: Payer: Self-pay

## 2021-02-04 MED ORDER — TIZANIDINE HCL 4 MG PO TABS: 4.0000 mg | ORAL_TABLET | Freq: Three times a day (TID) | ORAL | 0 refills | Status: DC | PRN

## 2021-02-04 NOTE — Telephone Encounter (Signed)
Received fax from pharmacy requesting new refill for Tizanidine 4 mg. I didn't see med in patient's list. Please advise.   LOV: 11/04/2020 Future: No Upcoming

## 2021-02-04 NOTE — Telephone Encounter (Signed)
Ok to send Tizanidine 4mg , 1 tab q8 prn spasm, #30, no refills

## 2021-02-04 NOTE — Addendum Note (Signed)
Addended by: Lenard Simmer on: 02/04/2021 03:19 PM   Modules accepted: Orders

## 2021-02-27 ENCOUNTER — Other Ambulatory Visit: Payer: Self-pay | Admitting: Family Medicine

## 2021-02-27 DIAGNOSIS — F331 Major depressive disorder, recurrent, moderate: Secondary | ICD-10-CM

## 2021-03-02 ENCOUNTER — Other Ambulatory Visit: Payer: Self-pay | Admitting: Family Medicine

## 2021-03-02 DIAGNOSIS — F331 Major depressive disorder, recurrent, moderate: Secondary | ICD-10-CM

## 2021-03-03 NOTE — Telephone Encounter (Signed)
Patient is requesting a refill of the following medications: Requested Prescriptions   Pending Prescriptions Disp Refills   venlafaxine XR (EFFEXOR-XR) 75 MG 24 hr capsule [Pharmacy Med Name: VENLAFAXINE HCL ER 75 MG CAP] 90 capsule 0    Sig: TAKE 1 CAPSULE BY MOUTH DAILY WITH BREAKFAST.    Date of patient request: 03/02/2021 Last office visit: 11/04/2020 Date of last refill: 12/04/2020 Last refill amount: 90 capsules Follow up time period per chart: N/a

## 2021-03-04 ENCOUNTER — Other Ambulatory Visit: Payer: Self-pay | Admitting: Family Medicine

## 2021-03-04 MED ORDER — ALPRAZOLAM 0.5 MG PO TABS: ORAL_TABLET | ORAL | 0 refills | Status: DC

## 2021-03-04 NOTE — Telephone Encounter (Signed)
Patient is requesting a refill of the following medications: Requested Prescriptions   Pending Prescriptions Disp Refills   ALPRAZolam (XANAX) 0.5 MG tablet 60 tablet 0    Sig: TAKE 1 TABLET BY MOUTH TWICE A DAY AS NEEDED FOR ANXIETY    Date of patient request: 03/04/21 Last office visit: 11/04/20 Date of last refill: 12/19/20 Last refill amount: 60

## 2021-03-04 NOTE — Telephone Encounter (Signed)
..  Caller name:Mirna Eriko Economos callback [phone]  Encourage patient to contact the pharmacy for refills or they can request refills through [facility]  LAST APPOINTMENT DATE:  (Please schedule appointment if longer than 1 year)  NEXT APPOINTMENT DATE: na  MEDICATION NAME & DOSE:  Alprazolam  Is the patient out of medication? YES/NO: Yes   PHARMACY:   [facility] [address]  Let patient know to contact pharmacy at the end of the day to make sure medication is ready.  Please notify patient to allow 48-72 hours to process  (CLINICAL TO FILL OR ROUTE PER PROTOCOLS)

## 2021-04-30 ENCOUNTER — Other Ambulatory Visit: Payer: Self-pay | Admitting: Family Medicine

## 2021-04-30 ENCOUNTER — Other Ambulatory Visit: Payer: Self-pay

## 2021-04-30 DIAGNOSIS — F331 Major depressive disorder, recurrent, moderate: Secondary | ICD-10-CM

## 2021-05-07 ENCOUNTER — Other Ambulatory Visit: Payer: Self-pay | Admitting: Family Medicine

## 2021-05-15 ENCOUNTER — Telehealth: Payer: Self-pay

## 2021-05-15 ENCOUNTER — Other Ambulatory Visit: Payer: Self-pay

## 2021-05-15 MED ORDER — ALPRAZOLAM 0.5 MG PO TABS: ORAL_TABLET | ORAL | 0 refills | Status: DC

## 2021-05-15 NOTE — Progress Notes (Signed)
Prescription filled at pt's request ?

## 2021-05-16 NOTE — Telephone Encounter (Signed)
Error

## 2021-05-29 ENCOUNTER — Other Ambulatory Visit: Payer: Self-pay | Admitting: Family Medicine

## 2021-05-29 DIAGNOSIS — F331 Major depressive disorder, recurrent, moderate: Secondary | ICD-10-CM

## 2021-06-02 ENCOUNTER — Telehealth: Payer: Self-pay | Admitting: Family Medicine

## 2021-06-02 DIAGNOSIS — F331 Major depressive disorder, recurrent, moderate: Secondary | ICD-10-CM

## 2021-06-09 ENCOUNTER — Other Ambulatory Visit: Payer: Self-pay

## 2021-06-09 ENCOUNTER — Other Ambulatory Visit: Payer: Self-pay | Admitting: Family Medicine

## 2021-06-09 DIAGNOSIS — F331 Major depressive disorder, recurrent, moderate: Secondary | ICD-10-CM

## 2021-06-09 MED ORDER — VENLAFAXINE HCL ER 75 MG PO CP24: 75.0000 mg | ORAL_CAPSULE | Freq: Every day | ORAL | 0 refills | Status: DC

## 2021-06-09 NOTE — Telephone Encounter (Signed)
Rx sent to pharmacy   

## 2021-06-09 NOTE — Telephone Encounter (Signed)
Pt called the venlafaxine XR (EFFEXOR-XR) 150 MG 24 hr capsule  was sent on January 5th but not her venlafaxine XR (EFFEXOR-XR) 75 MG 24 hr capsule  pt said she take both and needs the 75mg  sent to the pharmacy?  Pt call back [phone]

## 2021-06-12 ENCOUNTER — Telehealth: Payer: Self-pay | Admitting: Family Medicine

## 2021-06-12 NOTE — Telephone Encounter (Signed)
Attempted another call to pt no VM set up no answer, triage has attempted to call her, please advise any other actions.  She has been advised to go to ER but no follow up at this time

## 2021-06-12 NOTE — Telephone Encounter (Signed)
I'm not sure what she called about or what I'm giving advice on (as this is not listed in the note).  If we are not able to reach her by phone, our hands are tied unless we want to call 911 to do a welfare check

## 2021-06-12 NOTE — Telephone Encounter (Signed)
Sent to Health team for chest pt.  The nurse triage called back stating that she has been unable to contact the pt. She has called her 4 times with no answer and no VM set up.

## 2021-06-13 NOTE — Telephone Encounter (Signed)
Spoke to patient and she states that she is still having chest pain and that she had a temp of 100 last night, [facility] and would call us to update after she gets the results from that. I let her know that it is protocol to recommend ER for chest pain, she understood and expressed that she really just wants to see Dr Beverely Low. I let her know that I would send this to Dr Beverely Low for update and to wait on her call after [facility]

## 2021-06-13 NOTE — Telephone Encounter (Signed)
Spoke to patient and she states that she does not have covid and at the [facility] they evaluated her some and she noticed that she has the chest pain when she moves her arms. She thinks she may have pulled a muscle. I advised her to seek out urgent care or the ER over the weekend if not better also encouraged her to call us Monday if not better

## 2021-06-13 NOTE — Telephone Encounter (Signed)
I agree w/ the advice given.  If she is having chest pain, she should be evaluated in the ER or at least in a doctor's office.  Unfortunately I don't have any available appts and I don't want her waiting on me if she's having an issue.  But we can try and get her scheduled at another office or with another provider.

## 2021-06-29 ENCOUNTER — Other Ambulatory Visit: Payer: Self-pay | Admitting: Family

## 2021-07-02 ENCOUNTER — Ambulatory Visit (INDEPENDENT_AMBULATORY_CARE_PROVIDER_SITE_OTHER): Payer: 59 | Admitting: Family Medicine

## 2021-07-02 ENCOUNTER — Encounter: Payer: Self-pay | Admitting: Family Medicine

## 2021-07-02 VITALS — BP 132/80 | HR 73 | Temp 98.5°F | Resp 16 | Wt 182.2 lb

## 2021-07-02 DIAGNOSIS — M6289 Other specified disorders of muscle: Secondary | ICD-10-CM

## 2021-07-02 DIAGNOSIS — R159 Full incontinence of feces: Secondary | ICD-10-CM | POA: Diagnosis not present

## 2021-07-02 NOTE — Patient Instructions (Signed)
Schedule your complete physical in 3 months We'll call you with your physical therapy referral Make sure you go when you have to go and don't try and hold it Call with any questions or concerns Hang in there! Happy Early [place] Ouch!!!

## 2021-07-02 NOTE — Progress Notes (Signed)
° °  Subjective:    Patient ID: Vanessa [name], female    DOB: 1961-03-17, 61 y.o.   MRN: [id number]  HPI Fecal incontinence- pt has a hx of this.  Was referred for pelvic floor PT but was never able to go.  Saw Dr Christella Hartigan in the past.  Will have to leave work to change her clothes.   Review of Systems For ROS see HPI   This visit occurred during the SARS-CoV-2 public health emergency.  Safety protocols were in place, including screening questions prior to the visit, additional usage of staff PPE, and extensive cleaning of exam room while observing appropriate contact time as indicated for disinfecting solutions.      Objective:   Physical Exam Vitals reviewed.  Constitutional:      General: She is not in acute distress.    Appearance: Normal appearance. She is not ill-appearing.  HENT:     Head: Normocephalic and atraumatic.  Eyes:     Extraocular Movements: Extraocular movements intact.     Conjunctiva/sclera: Conjunctivae normal.     Pupils: Pupils are equal, round, and reactive to light.  Abdominal:     General: Abdomen is flat. There is no distension.     Palpations: Abdomen is soft.     Tenderness: There is no abdominal tenderness. There is no guarding or rebound.  Skin:    General: Skin is warm and dry.  Neurological:     General: No focal deficit present.     Mental Status: She is alert and oriented to person, place, and time.  Psychiatric:        Mood and Affect: Mood normal.        Behavior: Behavior normal.        Thought Content: Thought content normal.          Assessment & Plan:   Fecal incontinence- ongoing problem for pt.  She now has different insurance and would like to be re-referred to pelvic floor PT (as was recommended in 2020).  Referral placed.  Pt states that the blood she saw was due to rough TP and not actually from/in her stool.

## 2021-07-08 ENCOUNTER — Encounter: Payer: Self-pay | Admitting: Physical Therapy

## 2021-07-08 ENCOUNTER — Other Ambulatory Visit: Payer: Self-pay

## 2021-07-08 ENCOUNTER — Ambulatory Visit: Payer: 59 | Attending: Family Medicine | Admitting: Physical Therapy

## 2021-07-08 DIAGNOSIS — M6281 Muscle weakness (generalized): Secondary | ICD-10-CM | POA: Diagnosis not present

## 2021-07-08 DIAGNOSIS — M6289 Other specified disorders of muscle: Secondary | ICD-10-CM | POA: Diagnosis not present

## 2021-07-08 DIAGNOSIS — R159 Full incontinence of feces: Secondary | ICD-10-CM | POA: Insufficient documentation

## 2021-07-08 DIAGNOSIS — R279 Unspecified lack of coordination: Secondary | ICD-10-CM | POA: Insufficient documentation

## 2021-07-09 NOTE — Therapy (Signed)
[facility] [address] [place], [place], [address] Phone: [phone]   Fax:  [phone]  Physical Therapy Evaluation  Patient Details  Name: Vanessa [name] MRN: [id number] Date of Birth: August 13, 1960 Referring Provider (PT): Sheliah Hatch, MD   Encounter Date: 07/08/2021   PT End of Session - 07/08/21 1105     Visit Number 1    Date for PT Re-Evaluation 09/30/21    PT Start Time 1102    PT Stop Time 1142    PT Time Calculation (min) 40 min    Activity Tolerance Patient tolerated treatment well    Behavior During Therapy [facility] for tasks assessed/performed             Past Medical History:  Diagnosis Date   Anxiety    Arthritis    Chronic knee pain    Depression    Diverticulosis    Gall stone    Hepatitis C    Hx of Hepatitis C   Hypertension    Syphilis     Past Surgical History:  Procedure Laterality Date   ANAL RECTAL MANOMETRY N/A 02/20/2019   Procedure: ANO RECTAL MANOMETRY;  Surgeon: Rachael Fee, MD;  [facility]: [facility];  Service: Endoscopy;  Laterality: N/A;   CARPAL TUNNEL RELEASE Left    CHOLECYSTECTOMY N/A 11/24/2014   Procedure: LAPAROSCOPIC CHOLECYSTECTOMY WITH INTRAOPERATIVE CHOLANGIOGRAM;  Surgeon: Gaynelle Adu, MD;  Location[facility];  Service: General;  Laterality: N/A;   ECTOPIC PREGNANCY SURGERY     TOTAL KNEE ARTHROPLASTY Right 03/15/2020   Procedure: TOTAL KNEE ARTHROPLASTY;  Surgeon: Jodi Geralds, MD;  Location: [facility];  Service: Orthopedics;  Laterality: Right;    There were no vitals filed for this visit.    Subjective Assessment - 07/08/21 1105     Subjective Pt has been having leakage for a long time.  Pt states she has been having to clench to hold things for so long and the muscles got weak.  I have had to change clothes at work.  It happens about 1x/week.  The stool is soft not runny, but it is not solid.  I go 3-4 days without a BM.  It is really hard to get out  when I go    Patient Stated Goals not have leakage    Currently in Pain? No/denies                [facility] PT Assessment - 07/09/21 0001       Assessment   Medical Diagnosis R15.9 (ICD-10-CM) - Full incontinence of feces; M62.89 (ICD-10-CM) - Pelvic floor dysfunction    Referring Provider (PT) Sheliah Hatch, MD      Precautions   Precautions None      Balance Screen   Has the patient fallen in the past 6 months No      Home Environment   Living Environment Private residence    Living Arrangements Children   daughter     Prior Function   Level of Independence Independent    Vocation Full time employment      Functional Tests   Functional tests Single Leg Squat      Single Leg Squat   Comments trendelenburg      Posture/Postural Control   Posture/Postural Control Postural limitations    Postural Limitations Rounded Shoulders;Increased thoracic kyphosis      ROM / Strength   AROM / PROM / Strength AROM;PROM;Strength  AROM   Overall AROM Comments lumbar flexion limited      PROM   Overall PROM Comments hip flexion limited      Strength   Overall Strength Comments 4-/5; hip abduction 3+/5 bil      Flexibility   Soft Tissue Assessment /Muscle Length yes    Hamstrings 70% bil      Palpation   SI assessment  normal                     No emotional/communication barriers or cognitive limitation. Patient is motivated to learn. Patient understands and agrees with treatment goals and plan. PT explains patient will be examined in standing, sitting, and lying down to see how their muscles and joints work. When they are ready, they will be asked to remove their underwear so PT can examine their perineum. The patient is also given the option of providing their own chaperone as one is not provided in our facility. The patient also has the right and is explained the right to defer or refuse any part of the evaluation or treatment including the internal  exam. With the patient's consent, PT will use one gloved finger to gently assess the muscles of the pelvic floor, seeing how well it contracts and relaxes and if there is muscle symmetry. After, the patient will get dressed and PT and patient will discuss exam findings and plan of care. PT and patient discuss plan of care, schedule, attendance policy and HEP activities.    Objective measurements completed on examination: See above findings.     Pelvic Floor Special Questions - 07/09/21 0001     Prior Pregnancies Yes    Number of C-Sections 1    Urinary Leakage No    Fecal incontinence Yes   weekly   Skin Integrity Intact    Pelvic Floor Internal Exam pt identity confirmed and informed consent given    Exam Type Rectal    Sensation sensative    Palpation not tender but felt uncomfortable with anything in the anal area    Strength Flicker    Strength # of reps 1    Strength # of seconds 5    Tone high - unable to relax and tone became stronger but very slowly; not able to bulge just small relax when trying to expel finger              [facility] Adult PT Treatment/Exercise - 07/09/21 0001       Self-Care   Self-Care Other Self-Care Comments    Other Self-Care Comments  educated on toileting techniques                     PT Education - 07/09/21 1353     Education Details handout for healthy bowel habits    Person(s) Educated Patient    Methods Explanation;Demonstration;Handout    Comprehension Verbalized understanding              PT Short Term Goals - 07/09/21 1935       PT SHORT TERM GOAL #1   Title patient to be independent with initial HEP    Time 4    Period Weeks    Status New    Target Date 08/05/21      PT SHORT TERM GOAL #2   Title pt will be able to use the restroom within 15 minutes after getting an urge and not try to hold it for improved bowel  habits    Time 4    Period Weeks    Status New    Target Date 08/05/21                PT Long Term Goals - 07/09/21 1932       PT LONG TERM GOAL #1   Title patient to be independent with advanced HEP    Time 12    Period Weeks    Status New    Target Date 09/30/21      PT LONG TERM GOAL #2   Title Pt will have BM every 3 days at least due to improved bowel habits    Time 12    Period Weeks    Status New    Target Date 09/30/21      PT LONG TERM GOAL #3   Title Pt will report at least 75% less leakage and not needing to change clothes due to leakage    Time 12    Period Weeks    Status New    Target Date 09/30/21      PT LONG TERM GOAL #4   Title Pt will demonstrate at least 4+/5 hip abduction and adduction for improved stability and decreased LOB in single leg standing    Time 12    Period Weeks    Status New    Target Date 09/30/21      PT LONG TERM GOAL #5   Title .Marland KitchenMarland Kitchen                    Plan - 07/09/21 1937     Clinical Impression Statement Pt presents to skilled PT due to fecal incontinence.  Pt has increased sensativity of the anus and rectal area . Pt is very reactive to any light to medium pressure around the anus.  Pt tolerated rectal pelvic exam but only light pressure to anal sphincters and puborectalis.  Pt has weak push when trying to expel finger.  She has 1/5 MMT of anal sphincters, puborectalis 2/5 but slow to activate.  Anal sphincters get to 2/5 after puborectalis was fatigued.  After muscles are engaged they were all very slow to fully relax.  Pt needed cues to breathe in order to relax muscles.  Pt has hip weakness of 4-/5 in all directions but 3+/5 abduction.  Pt has tight h/s and lumbar paraspinals bil. Pt has decreased stability single leg stand.  Pt will benefit from skilled PT to address all impairments listed above.    Personal Factors and Comorbidities Comorbidity 2;Time since onset of injury/illness/exacerbation    Comorbidities chronic back pain, chronic constipation    Examination-Activity Limitations Toileting;Continence     Examination-Participation Restrictions Occupation;Community Activity    Stability/Clinical Decision Making Evolving/Moderate complexity    Clinical Decision Making Moderate    Rehab Potential Excellent    PT Frequency 1x / week    PT Duration 12 weeks    PT Treatment/Interventions ADLs/Self Care Home Management;Biofeedback;Cryotherapy;Electrical Stimulation;Therapeutic activities;Therapeutic exercise;Neuromuscular re-education;Patient/family education;Manual techniques;Passive range of motion;Dry needling    PT Next Visit Plan biofeedback, breathing and stretching    PT Home Exercise Plan toileting    Consulted and Agree with Plan of Care Patient             Patient will benefit from skilled therapeutic intervention in order to improve the following deficits and impairments:  Impaired flexibility, Decreased strength, Decreased endurance, Decreased coordination  Visit Diagnosis: Muscle weakness (generalized)  Unspecified lack of  coordination     Problem List Patient Active Problem List   Diagnosis Date Noted   Primary osteoarthritis of right knee 03/15/2020   Status post total knee replacement, right 03/15/2020   Incontinence of feces    Obesity (BMI 30-39.9) 07/12/2018   Allergic rhinitis 07/22/2017   Tear of medial meniscus of right knee, current 03/03/2017   Tear of lateral meniscus of right knee, current 03/03/2017   Vitamin D deficiency 12/04/2016   Baker's cyst of knee, right 05/21/2016   Edema 12/03/2015   Liver fibrosis 07/25/2015   S/P laparoscopic cholecystectomy 11/25/2014   Knee pain, right 07/17/2014   Physical exam 07/17/2014   Pap smear for cervical cancer screening 07/17/2014   Hyperlipidemia 01/12/2014   History of hepatitis C 04/27/2013   Substance abuse in remission [facility](HCC) 04/27/2013   Hypertension    Depression     Junious SilkJakki L Hiroyuki Ozanich, PT 07/09/2021, 8:01 PM  [facility] [address] [place], [place], [address] Phone: [phone]   Fax:  [phone]  Name: Vanessa [name] MRN: [id number] Date of Birth: [birth year]

## 2021-07-14 ENCOUNTER — Other Ambulatory Visit: Payer: Self-pay

## 2021-07-14 MED ORDER — ALPRAZOLAM 0.5 MG PO TABS: ORAL_TABLET | ORAL | 0 refills | Status: DC

## 2021-07-18 ENCOUNTER — Ambulatory Visit: Payer: 59 | Admitting: Physical Therapy

## 2021-07-18 ENCOUNTER — Telehealth: Payer: Self-pay | Admitting: Physical Therapy

## 2021-07-18 NOTE — Telephone Encounter (Signed)
Pt called due to no show. Voicemail not set up so unable to leave a message.  This was her last visit on the schedule and will leave chart open for 4 more weeks until 08/15/21 until she has to get a new order.  Gustavus Bryant, PT 07/18/21 9:08 AM

## 2021-08-04 ENCOUNTER — Other Ambulatory Visit: Payer: Self-pay | Admitting: Family Medicine

## 2021-09-05 ENCOUNTER — Other Ambulatory Visit: Payer: Self-pay | Admitting: Family Medicine

## 2021-09-05 DIAGNOSIS — F331 Major depressive disorder, recurrent, moderate: Secondary | ICD-10-CM

## 2021-09-08 ENCOUNTER — Other Ambulatory Visit: Payer: Self-pay | Admitting: Family Medicine

## 2021-09-16 ENCOUNTER — Ambulatory Visit: Payer: 59 | Admitting: Family Medicine

## 2021-09-17 ENCOUNTER — Ambulatory Visit (INDEPENDENT_AMBULATORY_CARE_PROVIDER_SITE_OTHER): Payer: 59 | Admitting: Family Medicine

## 2021-09-17 ENCOUNTER — Encounter: Payer: Self-pay | Admitting: Family Medicine

## 2021-09-17 VITALS — BP 126/70 | HR 77 | Temp 97.9°F | Ht 61.5 in | Wt 179.0 lb

## 2021-09-17 DIAGNOSIS — R82998 Other abnormal findings in urine: Secondary | ICD-10-CM | POA: Diagnosis not present

## 2021-09-17 DIAGNOSIS — L750 Bromhidrosis: Secondary | ICD-10-CM

## 2021-09-17 LAB — POCT URINALYSIS DIPSTICK
Glucose, UA: NEGATIVE
Ketones, UA: 5
Protein, UA: POSITIVE — AB
Spec Grav, UA: >=1.03 — AB (ref 1.010–1.025)
Urobilinogen, UA: 0.2 E.U./dL
pH, UA: 6 (ref 5.0–8.0)

## 2021-09-17 MED ORDER — ALPRAZOLAM 0.5 MG PO TABS: ORAL_TABLET | ORAL | 0 refills | Status: DC

## 2021-09-17 MED ORDER — CEPHALEXIN 500 MG PO CAPS: 500.0000 mg | ORAL_CAPSULE | Freq: Two times a day (BID) | ORAL | 0 refills | Status: AC

## 2021-09-17 NOTE — Progress Notes (Signed)
? ?  Subjective:  ? ? Patient ID: Vanessa [name], female    DOB: 12-15-1960, 61 y.o.   MRN: [id number] ? ?HPI ?Urine odor- sxs started ~1 month ago.  + increased frequency.  Pt reports painful intercourse.  + urinary urgency- will have accidents if she tries to hold it.  No burning w/ urination.  No blood w/ urination- does have bleeding after intercourse.  No fevers, chills.  + suprapubic pain. ? ? ?Review of Systems ?For ROS see HPI  ?   ?Objective:  ? Physical Exam ?Vitals reviewed.  ?Constitutional:   ?   General: She is not in acute distress. ?   Appearance: Normal appearance. She is not ill-appearing.  ?HENT:  ?   Head: Normocephalic and atraumatic.  ?Abdominal:  ?   General: Abdomen is flat. There is no distension.  ?   Palpations: Abdomen is soft.  ?   Tenderness: There is abdominal tenderness (mild suprapubic TTP). There is no guarding or rebound.  ?Musculoskeletal:  ?   Right lower leg: No edema.  ?   Left lower leg: No edema.  ?Skin: ?   General: Skin is warm and dry.  ?Neurological:  ?   General: No focal deficit present.  ?   Mental Status: She is alert and oriented to person, place, and time.  ?Psychiatric:     ?   Mood and Affect: Mood normal.     ?   Behavior: Behavior normal.     ?   Thought Content: Thought content normal.  ? ? ? ? ? ?   ?Assessment & Plan:  ? ?Abnormal urine odor- new.  Pt's sxs and UA consistent w/ infxn.  Start Keflex.  Send urine for culture.  Adjust meds if needed.  Pt expressed understanding and is in agreement w/ plan.  ?

## 2021-09-17 NOTE — Patient Instructions (Signed)
Follow up as needed or as scheduled ?Start the Cephalexin twice daily ?Drink lots of water!!! ?Call with any questions or concerns ?Stay Safe!  Stay Healthy! ?Hang in there!! ?

## 2021-09-19 LAB — URINE CULTURE
MICRO NUMBER:: 13315473
SPECIMEN QUALITY:: ADEQUATE

## 2021-09-22 ENCOUNTER — Telehealth: Payer: Self-pay

## 2021-09-22 NOTE — Telephone Encounter (Signed)
-----   Message from Sheliah Hatch, MD sent at 09/21/2021  8:26 PM EDT ----- ?UTI is being treated appropriately.  Hope you are feeling better! ?

## 2021-09-22 NOTE — Telephone Encounter (Signed)
Attempted to call about urine results. No answer and no vm set up ?

## 2021-09-23 NOTE — Telephone Encounter (Signed)
Called again, no answer, no vm 

## 2021-10-31 ENCOUNTER — Other Ambulatory Visit: Payer: Self-pay | Admitting: Registered Nurse

## 2021-10-31 ENCOUNTER — Other Ambulatory Visit: Payer: Self-pay | Admitting: Family Medicine

## 2021-10-31 DIAGNOSIS — F331 Major depressive disorder, recurrent, moderate: Secondary | ICD-10-CM

## 2021-11-06 ENCOUNTER — Encounter: Payer: 59 | Admitting: Family Medicine

## 2021-11-07 ENCOUNTER — Other Ambulatory Visit: Payer: Self-pay | Admitting: Family Medicine

## 2021-11-20 ENCOUNTER — Ambulatory Visit (INDEPENDENT_AMBULATORY_CARE_PROVIDER_SITE_OTHER): Payer: 59 | Admitting: Family Medicine

## 2021-11-20 ENCOUNTER — Other Ambulatory Visit (HOSPITAL_COMMUNITY)
Admission: RE | Admit: 2021-11-20 | Discharge: 2021-11-20 | Disposition: A | Discharge status: Home / Self Care | Payer: 59 | Source: Ambulatory Visit | Attending: Family Medicine | Admitting: Family Medicine

## 2021-11-20 ENCOUNTER — Encounter: Payer: Self-pay | Admitting: Family Medicine

## 2021-11-20 VITALS — BP 116/80 | HR 64 | Temp 98.0°F | Resp 16 | Ht 61.0 in | Wt 179.2 lb

## 2021-11-20 DIAGNOSIS — Z1231 Encounter for screening mammogram for malignant neoplasm of breast: Secondary | ICD-10-CM

## 2021-11-20 DIAGNOSIS — Z0000 Encounter for general adult medical examination without abnormal findings: Secondary | ICD-10-CM

## 2021-11-20 DIAGNOSIS — N898 Other specified noninflammatory disorders of vagina: Secondary | ICD-10-CM | POA: Diagnosis present

## 2021-11-20 DIAGNOSIS — E559 Vitamin D deficiency, unspecified: Secondary | ICD-10-CM

## 2021-11-20 DIAGNOSIS — I10 Essential (primary) hypertension: Secondary | ICD-10-CM

## 2021-11-20 LAB — VITAMIN D 25 HYDROXY (VIT D DEFICIENCY, FRACTURES): VITD: 28.27 ng/mL — ABNORMAL LOW (ref 30.00–100.00)

## 2021-11-20 LAB — LIPID PANEL
Cholesterol: 200 mg/dL (ref 0–200)
HDL: 56.5 mg/dL (ref >39.00)
LDL Cholesterol: 125 mg/dL — ABNORMAL HIGH (ref 0–99)
NonHDL: 143.67
Total CHOL/HDL Ratio: 4
Triglycerides: 94 mg/dL (ref 0.0–149.0)
VLDL: 18.8 mg/dL (ref 0.0–40.0)

## 2021-11-20 LAB — CBC WITH DIFFERENTIAL/PLATELET
Basophils Absolute: 0.1 10*3/uL (ref 0.0–0.1)
Basophils Relative: 1.2 % (ref 0.0–3.0)
Eosinophils Absolute: 0 10*3/uL (ref 0.0–0.7)
Eosinophils Relative: 0.6 % (ref 0.0–5.0)
HCT: 42.5 % (ref 36.0–46.0)
Hemoglobin: 13.9 g/dL (ref 12.0–15.0)
Lymphocytes Relative: 23.3 % (ref 12.0–46.0)
Lymphs Abs: 1.8 10*3/uL (ref 0.7–4.0)
MCHC: 32.6 g/dL (ref 30.0–36.0)
MCV: 88.5 fl (ref 78.0–100.0)
Monocytes Absolute: 0.3 10*3/uL (ref 0.1–1.0)
Monocytes Relative: 3.3 % (ref 3.0–12.0)
Neutro Abs: 5.6 10*3/uL (ref 1.4–7.7)
Neutrophils Relative %: 71.6 % (ref 43.0–77.0)
Platelets: 268 10*3/uL (ref 150.0–400.0)
RBC: 4.8 Mil/uL (ref 3.87–5.11)
RDW: 15.2 % (ref 11.5–15.5)
WBC: 7.8 10*3/uL (ref 4.0–10.5)

## 2021-11-20 LAB — HEPATIC FUNCTION PANEL
ALT: 19 U/L (ref 0–35)
AST: 19 U/L (ref 0–37)
Albumin: 4.4 g/dL (ref 3.5–5.2)
Alkaline Phosphatase: 64 U/L (ref 39–117)
Bilirubin, Direct: 0 mg/dL (ref 0.0–0.3)
Total Bilirubin: 0.2 mg/dL (ref 0.2–1.2)
Total Protein: 7.8 g/dL (ref 6.0–8.3)

## 2021-11-20 LAB — BASIC METABOLIC PANEL
BUN: 21 mg/dL (ref 6–23)
CO2: 26 mEq/L (ref 19–32)
Calcium: 9.3 mg/dL (ref 8.4–10.5)
Chloride: 102 mEq/L (ref 96–112)
Creatinine, Ser: 0.72 mg/dL (ref 0.40–1.20)
GFR: 90.93 mL/min (ref >60.00)
Glucose, Bld: 141 mg/dL — ABNORMAL HIGH (ref 70–99)
Potassium: 3.6 mEq/L (ref 3.5–5.1)
Sodium: 139 mEq/L (ref 135–145)

## 2021-11-20 LAB — TSH: TSH: 0.96 u[IU]/mL (ref 0.35–5.50)

## 2021-11-20 MED ORDER — METRONIDAZOLE 500 MG PO TABS: 500.0000 mg | ORAL_TABLET | Freq: Two times a day (BID) | ORAL | 0 refills | Status: AC

## 2021-11-20 NOTE — Assessment & Plan Note (Signed)
Chronic problem.  Currently well controlled.  Asymptomatic.  Check labs.  No anticipated med changes. 

## 2021-11-20 NOTE — Progress Notes (Signed)
   Subjective:    Patient ID: Vanessa [name], female    DOB: 04-Apr-1961, 61 y.o.   MRN: [id number]  HPI CPE- UTD on colonoscopy, Tdap, pap.  Due for mammo.  Patient Care Team    Relationship Specialty Notifications Start End  Sheliah Hatch, MD PCP - General Family Medicine  04/27/13    Comment: Ricki Rodriguez, Malena Catholic, MD Referring Physician Anesthesiology  07/17/14   Rachael Fee, MD Attending Physician Gastroenterology  07/17/14     Health Maintenance  Topic Date Due   MAMMOGRAM  02/02/2020   PAP SMEAR-Modifier  07/12/2021   Zoster Vaccines- Shingrix (1 of 2) 02/20/2022 (Originally 07/23/2011)   INFLUENZA VACCINE  12/23/2021   COLONOSCOPY (Pts 45-28yrs Insurance coverage will need to be confirmed)  11/15/2023   TETANUS/TDAP  08/04/2025   Hepatitis C Screening  Completed   HIV Screening  Completed   HPV VACCINES  Aged Out   COVID-19 Vaccine  Discontinued      Review of Systems Patient reports no vision/ hearing changes, adenopathy,fever, weight change,  persistant/recurrent hoarseness , swallowing issues, chest pain, palpitations, edema, persistant/recurrent cough, hemoptysis, dyspnea (rest/exertional/paroxysmal nocturnal), gastrointestinal bleeding (melena, rectal bleeding), abdominal pain, significant heartburn, bowel changes, GU symptoms (dysuria, hematuria, incontinence),  syncope, focal weakness, memory loss, numbness & tingling, skin/hair/nail changes, abnormal bruising or bleeding, anxiety, or depression.   + vaginal odor, no d/c    Objective:   Physical Exam General Appearance:    Alert, cooperative, no distress, appears stated age, obese  Head:    Normocephalic, without obvious abnormality, atraumatic  Eyes:    PERRL, conjunctiva/corneas clear, EOM's intact both eyes  Ears:    Normal TM's and external ear canals, both ears  Nose:   Nares normal, septum midline, mucosa normal, no drainage    or sinus tenderness  Throat:   Lips, mucosa, and tongue normal; teeth  and gums normal  Neck:   Supple, symmetrical, trachea midline, no adenopathy;    Thyroid: no enlargement/tenderness/nodules  Back:     Symmetric, no curvature, ROM normal, no CVA tenderness  Lungs:     Clear to auscultation bilaterally, respirations unlabored  Chest Wall:    No tenderness or deformity   Heart:    Regular rate and rhythm, S1 and S2 normal, no murmur, rub   or gallop  Breast Exam:    Deferred to mammo  Abdomen:     Soft, non-tender, bowel sounds active all four quadrants,    no masses, no organomegaly  Genitalia:    Deferred  Rectal:    Extremities:   Extremities normal, atraumatic, no cyanosis or edema  Pulses:   2+ and symmetric all extremities  Skin:   Skin color, texture, turgor normal, no rashes or lesions  Lymph nodes:   Cervical, supraclavicular, and axillary nodes normal  Neurologic:   CNII-XII intact, normal strength, sensation and reflexes    throughout          Assessment & Plan:  Vaginal odor- new.  Suspect BV.  Will get urine cytology but pt is very uncomfortable w/ the odor and is asking for a medication while we are waiting on Cytology.  Flagyl sent to pharmacy.

## 2021-11-20 NOTE — Patient Instructions (Signed)
Follow up in 6 months to recheck blood pressure We'll notify you of your lab results and make any changes if needed Keep up the good work on healthy diet and regular exercise! They'll call you to schedule your mammogram Call with any questions or concerns Stay Safe!  Stay Healthy! Have a wonderful summer!!!

## 2021-11-20 NOTE — Assessment & Plan Note (Signed)
Check labs and replete prn. 

## 2021-11-20 NOTE — Assessment & Plan Note (Signed)
Pt's PE WNL w/ exception of obesity.  UTD on pap.  Due for mammo- ordered.  UTD on colonoscopy.  Check labs.  Anticipatory guidance provided.

## 2021-11-21 ENCOUNTER — Telehealth: Payer: Self-pay

## 2021-11-21 ENCOUNTER — Other Ambulatory Visit: Payer: Self-pay

## 2021-11-21 ENCOUNTER — Other Ambulatory Visit (INDEPENDENT_AMBULATORY_CARE_PROVIDER_SITE_OTHER): Payer: 59

## 2021-11-21 DIAGNOSIS — R7309 Other abnormal glucose: Secondary | ICD-10-CM

## 2021-11-21 LAB — HEMOGLOBIN A1C: Hgb A1c MFr Bld: 6.2 % (ref 4.6–6.5)

## 2021-11-21 NOTE — Telephone Encounter (Signed)
-----   Message from Sheliah Hatch, MD sent at 11/21/2021 12:23 PM EDT ----- Thankfully no diabetes but you are in the pre-diabetes range.  Make sure you are working on a low carb/low sugar diet and regular exercise

## 2021-11-21 NOTE — Telephone Encounter (Signed)
Spoke w/ pt and advised of lab results  

## 2021-11-21 NOTE — Telephone Encounter (Signed)
-----   Message from Sheliah Hatch, MD sent at 11/21/2021  7:31 AM EDT ----- Vit D is mildly low.  Please make sure you are taking an OTC supplement of at least 2,000 units daily  Total cholesterol and LDL (bad cholesterol) have both increased but this will improve w/ healthy diet and regular physical activity (like walking)  Sugar is elevated.  We are going to add an A1C to assess for possible diabetes  Remainder of labs look good!

## 2021-11-24 LAB — URINE CYTOLOGY ANCILLARY ONLY
Bacterial Vaginitis-Urine: NEGATIVE
Candida Urine: NEGATIVE
Chlamydia: NEGATIVE
Comment: NEGATIVE
Comment: NEGATIVE
Comment: NORMAL
Neisseria Gonorrhea: NEGATIVE
Trichomonas: NEGATIVE

## 2021-11-29 ENCOUNTER — Other Ambulatory Visit: Payer: Self-pay | Admitting: Registered Nurse

## 2021-11-29 DIAGNOSIS — F331 Major depressive disorder, recurrent, moderate: Secondary | ICD-10-CM

## 2021-12-30 ENCOUNTER — Other Ambulatory Visit: Payer: Self-pay | Admitting: Family Medicine

## 2021-12-30 ENCOUNTER — Encounter: Payer: Self-pay | Admitting: Family Medicine

## 2021-12-30 ENCOUNTER — Ambulatory Visit: Payer: 59 | Admitting: Family Medicine

## 2021-12-30 VITALS — BP 120/70 | HR 87 | Temp 98.0°F | Resp 16 | Ht 61.0 in | Wt 174.0 lb

## 2021-12-30 DIAGNOSIS — J029 Acute pharyngitis, unspecified: Secondary | ICD-10-CM | POA: Diagnosis not present

## 2021-12-30 DIAGNOSIS — J301 Allergic rhinitis due to pollen: Secondary | ICD-10-CM | POA: Diagnosis not present

## 2021-12-30 LAB — POCT RAPID STREP A (OFFICE): Rapid Strep A Screen: NEGATIVE

## 2021-12-30 MED ORDER — MOMETASONE FUROATE 50 MCG/ACT NA SUSP: 2.0000 | Freq: Every day | NASAL | 12 refills | Status: DC

## 2021-12-30 MED ORDER — CETIRIZINE HCL 10 MG PO TABS: 10.0000 mg | ORAL_TABLET | Freq: Every day | ORAL | 11 refills | Status: DC

## 2021-12-30 NOTE — Assessment & Plan Note (Signed)
Deteriorated.  Pt's sxs are consistent w/ throat irritation from PND.  Start daily antihistamine and add nasal spray for additional relief.  Pt expressed understanding and is in agreement w/ plan.

## 2021-12-30 NOTE — Progress Notes (Signed)
   Subjective:    Patient ID: Vanessa [name], female    DOB: 1961-05-09, 61 y.o.   MRN: [id number]  HPI Sore throat- 'a couple of weeks ago' went to a [facility] 'bc I felt so bad'.  Was given Amoxicillin which improved the head and sinus pressure but not the sore throat.  Pt is having copious PND.  Not currently on allergy medication.  No fevers.  No ear pain.   Review of Systems For ROS see HPI     Objective:   Physical Exam Vitals reviewed.  Constitutional:      General: She is not in acute distress.    Appearance: She is well-developed. She is not ill-appearing.  HENT:     Head: Normocephalic and atraumatic.     Right Ear: Tympanic membrane normal.     Left Ear: Tympanic membrane normal.     Nose: Mucosal edema and rhinorrhea present.     Right Sinus: No maxillary sinus tenderness or frontal sinus tenderness.     Left Sinus: No maxillary sinus tenderness or frontal sinus tenderness.     Mouth/Throat:     Pharynx: Posterior oropharyngeal erythema (w/ PND) present.  Eyes:     Conjunctiva/sclera: Conjunctivae normal.     Pupils: Pupils are equal, round, and reactive to light.  Cardiovascular:     Rate and Rhythm: Normal rate and regular rhythm.     Heart sounds: Normal heart sounds.  Pulmonary:     Effort: Pulmonary effort is normal. No respiratory distress.     Breath sounds: Normal breath sounds. No wheezing or rales.  Musculoskeletal:     Cervical back: Normal range of motion and neck supple.  Lymphadenopathy:     Cervical: No cervical adenopathy.  Skin:    General: Skin is warm and dry.  Neurological:     General: No focal deficit present.     Mental Status: She is alert and oriented to person, place, and time.  Psychiatric:        Mood and Affect: Mood normal.        Behavior: Behavior normal.        Thought Content: Thought content normal.           Assessment & Plan:

## 2021-12-30 NOTE — Patient Instructions (Signed)
Follow up as needed or as scheduled This is NOT strep throat (thank goodness) and is due to all the allergy drainage START the Cetirizine (Zyrtec) once daily USE the nasal spray- 2 squirts in each nostril daily Drink LOTS of water to rinse off the back of the throat Call with any questions or concerns Hang in there!!!

## 2022-01-16 ENCOUNTER — Other Ambulatory Visit: Payer: Self-pay | Admitting: Family Medicine

## 2022-01-16 NOTE — Telephone Encounter (Signed)
Patient is requesting a refill of the following medications: Requested Prescriptions   Pending Prescriptions Disp Refills   ALPRAZolam (XANAX) 0.5 MG tablet [Pharmacy Med Name: ALPRAZOLAM 0.5 MG TABLET] 60 tablet 0    Sig: TAKE 1 TABLET BY MOUTH TWICE A DAY AS NEEDED FOR ANXIETY    Date of patient request: 01/15/2022 Last office visit: 12/30/2021 Date of last refill: 11/07/2021 Last refill amount: 60 Follow up time period per chart:

## 2022-03-18 ENCOUNTER — Other Ambulatory Visit: Payer: Self-pay | Admitting: Family Medicine

## 2022-03-18 NOTE — Telephone Encounter (Signed)
Xanax 0.5 mg LOV: 12/30/21 Last Refill:01/16/22 Upcoming appt: none

## 2022-03-28 ENCOUNTER — Other Ambulatory Visit: Payer: Self-pay | Admitting: Family Medicine

## 2022-04-01 ENCOUNTER — Encounter: Payer: Self-pay | Admitting: Family Medicine

## 2022-04-01 ENCOUNTER — Ambulatory Visit: Payer: 59 | Admitting: Family Medicine

## 2022-04-01 VITALS — BP 116/80 | HR 85 | Temp 98.4°F | Resp 19 | Ht 61.0 in | Wt 185.0 lb

## 2022-04-01 DIAGNOSIS — Z23 Encounter for immunization: Secondary | ICD-10-CM

## 2022-04-01 DIAGNOSIS — J069 Acute upper respiratory infection, unspecified: Secondary | ICD-10-CM | POA: Diagnosis not present

## 2022-04-01 DIAGNOSIS — L84 Corns and callosities: Secondary | ICD-10-CM

## 2022-04-01 DIAGNOSIS — M545 Low back pain, unspecified: Secondary | ICD-10-CM

## 2022-04-01 MED ORDER — PREDNISONE 10 MG PO TABS: ORAL_TABLET | ORAL | 0 refills | Status: DC

## 2022-04-01 NOTE — Progress Notes (Signed)
   Subjective:    Patient ID: Vanessa [name], female    DOB: 12/01/1960, 62 y.o.   MRN: [id number]  HPI Cough and congestion- pt reports head and chest congestion.  'i rattle when I lay down'.  + HA's.  No fevers. + frontal sinus pressure.  + PND.  'my back is killing me'- L sided, worse w/ movement.  Sxs started yesterday at work.  No radiation of pain.  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  Spot on foot- L heel, ball of R foot.  Painful to touch.  L heel was unroofed during pedicure and bled.  No surrounding redness or induration.     Review of Systems For ROS see HPI     Objective:   Physical Exam Vitals reviewed.  Constitutional:      General: She is not in acute distress.    Appearance: Normal appearance. She is well-developed. She is not ill-appearing.  HENT:     Head: Normocephalic and atraumatic.     Right Ear: Tympanic membrane normal.     Left Ear: Tympanic membrane normal.     Nose: Mucosal edema and congestion present. No rhinorrhea.     Right Sinus: No maxillary sinus tenderness or frontal sinus tenderness.     Left Sinus: No maxillary sinus tenderness or frontal sinus tenderness.     Mouth/Throat:     Pharynx: Posterior oropharyngeal erythema (w/ PND) present.  Eyes:     Conjunctiva/sclera: Conjunctivae normal.     Pupils: Pupils are equal, round, and reactive to light.  Cardiovascular:     Rate and Rhythm: Normal rate and regular rhythm.     Heart sounds: Normal heart sounds.  Pulmonary:     Effort: Pulmonary effort is normal. No respiratory distress.     Breath sounds: Normal breath sounds. No wheezing or rales.  Musculoskeletal:        General: Tenderness (TTP over L lumbar paraspinal muscles) present. No swelling.     Cervical back: Normal range of motion and neck supple.  Lymphadenopathy:     Cervical: No cervical adenopathy.  Skin:    General: Skin is warm and dry.     Findings: Lesion (small corn on ball of R foot and unroofed corn on L  heel) present.  Neurological:     General: No focal deficit present.     Mental Status: She is alert and oriented to person, place, and time.  Psychiatric:        Mood and Affect: Mood normal.        Behavior: Behavior normal.        Thought Content: Thought content normal.           Assessment & Plan:  Cough/congestion- new.  No evidence of bacterial infxn but clearly inflammation on PE.  Start prednisone taper to improve congestion and drainage.  Reviewed supportive care and red flags that should prompt return.  Pt expressed understanding and is in agreement w/ plan.   Lumbar back pain- new.  Likely a strain sustained during pt care.  Thankfully no radicular sxs.  Start Prednisone taper as above.  Pt expressed understanding and is in agreement w/ plan.   Corn/callous- new.  Thankfully no evidence of infection.  Pt to start tx w/ OTC medicated corn pads.  Pt expressed understanding and is in agreement w/ plan.

## 2022-04-01 NOTE — Patient Instructions (Signed)
Follow up as needed or as scheduled START the Prednisone as directed- 3 pills at the same time x3 days, then 2 pills at the same time x3 days, and then 1 pill daily.  Take w/ food Use a heating pad for your back pain Get OTC medicated corn/callous pads for the areas on your feet Call with any questions or concerns Hang in there!!!

## 2022-04-09 ENCOUNTER — Other Ambulatory Visit: Payer: Self-pay | Admitting: Family Medicine

## 2022-04-09 DIAGNOSIS — F331 Major depressive disorder, recurrent, moderate: Secondary | ICD-10-CM

## 2022-05-08 ENCOUNTER — Ambulatory Visit: Payer: 59 | Admitting: Family Medicine

## 2022-05-10 ENCOUNTER — Other Ambulatory Visit: Payer: Self-pay | Admitting: Family Medicine

## 2022-05-26 ENCOUNTER — Other Ambulatory Visit: Payer: Self-pay | Admitting: Family Medicine

## 2022-05-26 NOTE — Telephone Encounter (Signed)
Patient is requesting a refill of the following medications: Requested Prescriptions   Pending Prescriptions Disp Refills   ALPRAZolam (XANAX) 0.5 MG tablet [Pharmacy Med Name: ALPRAZOLAM 0.5 MG TABLET] 60 tablet 0    Sig: TAKE 1 TABLET BY MOUTH TWICE A DAY AS NEEDED FOR ANXIETY    Date of patient request: 05/26/22 Last office visit: 04/01/22 Date of last refill: 03/18/22 Last refill amount: 60

## 2022-06-08 ENCOUNTER — Other Ambulatory Visit: Payer: Self-pay | Admitting: Family Medicine

## 2022-06-08 NOTE — Telephone Encounter (Signed)
Xanax 0.5 mg LOV: 04/01/22 Last Refill:06/17/22 Upcoming appt: none

## 2022-06-09 ENCOUNTER — Other Ambulatory Visit: Payer: Self-pay

## 2022-06-09 DIAGNOSIS — F331 Major depressive disorder, recurrent, moderate: Secondary | ICD-10-CM

## 2022-06-09 MED ORDER — VENLAFAXINE HCL ER 150 MG PO CP24: 150.0000 mg | ORAL_CAPSULE | Freq: Every day | ORAL | 1 refills | Status: DC

## 2022-06-09 NOTE — Telephone Encounter (Signed)
Informed pt that the xanax has been denied

## 2022-06-10 ENCOUNTER — Telehealth: Payer: Self-pay | Admitting: Family Medicine

## 2022-06-10 DIAGNOSIS — F331 Major depressive disorder, recurrent, moderate: Secondary | ICD-10-CM

## 2022-06-10 MED ORDER — VENLAFAXINE HCL ER 75 MG PO CP24: 75.0000 mg | ORAL_CAPSULE | Freq: Every day | ORAL | 1 refills | Status: DC

## 2022-06-10 NOTE — Telephone Encounter (Signed)
Encourage patient to contact the pharmacy for refills or they can request refills through [facility]  (Please schedule appointment if patient has not been seen in over a year)  Last ov was 04/01/22  WHAT PHARMACY WOULD THEY LIKE THIS SENT TO:   [id number] - [place], [place] - [address]    MEDICATION NAME & DOSE: venlafaxine XR (EFFEXOR-XR) 75 MG 24 hr capsule   NOTES/COMMENTS FROM PATIENT:       [place] office please notify patient: It takes 48-72 hours to process rx refill requests Ask patient to call pharmacy to ensure rx is ready before heading there.

## 2022-06-22 ENCOUNTER — Encounter: Payer: Self-pay | Admitting: Family Medicine

## 2022-06-22 ENCOUNTER — Ambulatory Visit (INDEPENDENT_AMBULATORY_CARE_PROVIDER_SITE_OTHER): Payer: 59 | Admitting: Family Medicine

## 2022-06-22 VITALS — BP 138/82 | HR 85 | Temp 98.2°F | Resp 18 | Ht 61.0 in | Wt 189.0 lb

## 2022-06-22 DIAGNOSIS — H6122 Impacted cerumen, left ear: Secondary | ICD-10-CM

## 2022-06-22 DIAGNOSIS — R0981 Nasal congestion: Secondary | ICD-10-CM | POA: Diagnosis not present

## 2022-06-22 DIAGNOSIS — J069 Acute upper respiratory infection, unspecified: Secondary | ICD-10-CM | POA: Diagnosis not present

## 2022-06-22 LAB — POCT RAPID STREP A (OFFICE): Rapid Strep A Screen: NEGATIVE

## 2022-06-22 LAB — POCT INFLUENZA A/B
Influenza A, POC: NEGATIVE
Influenza B, POC: NEGATIVE

## 2022-06-22 LAB — POC COVID19 BINAXNOW: SARS Coronavirus 2 Ag: NEGATIVE

## 2022-06-22 NOTE — Progress Notes (Signed)
Subjective:  Patient ID: Vanessa [name], female    DOB: November 28, 1960  Age: 62 y.o. MRN: [id number]  CC:  Chief Complaint  Patient presents with   Sore Throat    Pt states she has a sore throat Headache ,neck hurts Eyes watery  No fever     HPI Vanessa [name] presents for   Sore throat: Some chronic nasal drainage for months.  Today with HA, sore throat, sore in neck, fatigue. Felt ok yesterday, noted symptoms this morning. Slight cough, no dyspnea/chest pain.   Dialysis tech, some sick contacts.   Tx: none.   No recent covid booster, had flu vaccine last fall.    History Patient Active Problem List   Diagnosis Date Noted   Primary osteoarthritis of right knee 03/15/2020   Status post total knee replacement, right 03/15/2020   Incontinence of feces    Obesity (BMI 30-39.9) 07/12/2018   Allergic rhinitis 07/22/2017   Tear of medial meniscus of right knee, current 03/03/2017   Tear of lateral meniscus of right knee, current 03/03/2017   Vitamin D deficiency 12/04/2016   Baker's cyst of knee, right 05/21/2016   Edema 12/03/2015   Liver fibrosis 07/25/2015   S/P laparoscopic cholecystectomy 11/25/2014   Knee pain, right 07/17/2014   Physical exam 07/17/2014   Pap smear for cervical cancer screening 07/17/2014   Hyperlipidemia 01/12/2014   History of hepatitis C 04/27/2013   Substance abuse in remission ([place]) 04/27/2013   Hypertension    Depression    Past Medical History:  Diagnosis Date   Anxiety    Arthritis    Chronic knee pain    Depression    Diverticulosis    Gall stone    Hepatitis C    Hx of Hepatitis C   Hypertension    Syphilis    Past Surgical History:  Procedure Laterality Date   ANAL RECTAL MANOMETRY N/A 02/20/2019   Procedure: ANO RECTAL MANOMETRY;  Surgeon: Milus Banister, MD;  Location: [facility];  Service: Endoscopy;  Laterality: N/A;   CARPAL TUNNEL RELEASE Left    CHOLECYSTECTOMY N/A 11/24/2014   Procedure: LAPAROSCOPIC  CHOLECYSTECTOMY WITH INTRAOPERATIVE CHOLANGIOGRAM;  Surgeon: Greer Pickerel, MD;  Location: [facility];  Service: General;  Laterality: N/A;   ECTOPIC PREGNANCY SURGERY     TOTAL KNEE ARTHROPLASTY Right 03/15/2020   Procedure: TOTAL KNEE ARTHROPLASTY;  Surgeon: Dorna Leitz, MD;  Location: [facility];  Service: Orthopedics;  Laterality: Right;   No Known Allergies Prior to Admission medications   Medication Sig Start Date End Date Taking? Authorizing Provider  albuterol (VENTOLIN HFA) 108 (90 Base) MCG/ACT inhaler Inhale 2 puffs into the lungs every 4 (four) hours as needed. 05/08/22  Yes [provider]  ALPRAZolam Duanne Moron) 0.5 MG tablet TAKE 1 TABLET BY MOUTH TWICE A DAY AS NEEDED FOR ANXIETY 05/26/22  Yes Midge Minium, MD  ascorbic acid (VITAMIN C) 1000 MG tablet Take 1,000 mg by mouth daily.  01/30/16  Yes [provider]  baclofen (LIORESAL) 10 MG tablet Take 0.5-1 tablets (5-10 mg total) by mouth at bedtime as needed for muscle spasms. 12/04/20  Yes Hilts, Legrand Como, MD  benazepril-hydrochlorthiazide (LOTENSIN HCT) 10-12.5 MG tablet TAKE 1 TABLET BY MOUTH EVERY DAY 04/09/22  Yes Midge Minium, MD  cetirizine (ZYRTEC) 10 MG tablet Take 1 tablet (10 mg total) by mouth daily. 12/30/21  Yes Midge Minium, MD  fluticasone (FLONASE) 50 MCG/ACT nasal spray SPRAY 2 SPRAYS INTO EACH NOSTRIL EVERY DAY 03/30/22  Yes Midge Minium, MD  ibuprofen (ADVIL) 800 MG tablet TAKE 1 TABLET BY MOUTH THREE TIMES A DAY AS NEEDED 05/11/22  Yes Midge Minium, MD  Multiple Vitamin (MULTIVITAMIN WITH MINERALS) TABS tablet Take 1 tablet by mouth daily.   Yes [provider]  SUBOXONE 8-2 MG FILM Place 1 Film under the tongue 2 (two) times daily.  04/18/13  Yes [provider]  venlafaxine XR (EFFEXOR-XR) 150 MG 24 hr capsule Take 1 capsule (150 mg total) by mouth daily with breakfast. 06/09/22  Yes Midge Minium, MD  venlafaxine XR (EFFEXOR-XR) 75 MG 24 hr capsule Take 1  capsule (75 mg total) by mouth daily with breakfast. 06/10/22  Yes Midge Minium, MD  predniSONE (DELTASONE) 10 MG tablet 3 tabs x3 days and then 2 tabs x3 days and then 1 tab x3 days.  Take w/ food. Patient not taking: Reported on 06/22/2022 04/01/22   Midge Minium, MD   Social History   Socioeconomic History   Marital status: Single    Spouse name: Not on file   Number of children: 1   Years of education: Not on file   Highest education level: Not on file  Occupational History   Occupation: Dialysis Tech  Tobacco Use   Smoking status: Every Day    Packs/day: 1.00    Years: 35.00    Total pack years: 35.00    Types: E-cigarettes, Cigarettes    Start date: 05/25/1994    Last attempt to quit: 03/03/2016    Years since quitting: 6.3   Smokeless tobacco: Never  Vaping Use   Vaping Use: Every day   Substances: Nicotine, Flavoring  Substance and Sexual Activity   Alcohol use: No    Alcohol/week: 0.0 standard drinks of alcohol    Comment: "last alcohol was in the 1990's"   Drug use: Not Currently    Types: Marijuana, "Crack" cocaine    Comment: "last drug use was in the 1990's; never used heroin"   Sexual activity: Not Currently    Birth control/protection: Post-menopausal  Other Topics Concern   Not on file  Social History Narrative   Works at [employer] in [employer]. Lives with daughter.   Social Determinants of Health   Financial Resource Strain: Not on file  Food Insecurity: Not on file  Transportation Needs: Not on file  Physical Activity: Not on file  Stress: Not on file  Social Connections: Not on file  Intimate Partner Violence: Not on file    Review of Systems   Objective:   Vitals:   06/22/22 1108  BP: 138/82  Pulse: 85  Resp: 18  Temp: 98.2 F (36.8 C)  TempSrc: Oral  SpO2: 98%  Weight: 189 lb (85.7 kg)  Height: 5\' 1"  (1.549 m)     Physical Exam Vitals reviewed.  Constitutional:      General: She is not in acute distress.     Appearance: She is well-developed.  HENT:     Head: Normocephalic and atraumatic.     Right Ear: Hearing, tympanic membrane, ear canal and external ear normal.     Left Ear: Hearing, tympanic membrane and external ear normal.     Ears:     Comments: Area of cerumen partially obstructing canal, visualized TM appeared pearly gray.  Attempted movement/removal of cerumen with lighted ear curette, but adherent to wall.     Nose: Nose normal.     Mouth/Throat:     Pharynx: No posterior  oropharyngeal erythema.  Eyes:     Conjunctiva/sclera: Conjunctivae normal.     Pupils: Pupils are equal, round, and reactive to light.  Neck:     Comments: Neck is supple, denies photo/phonophobia.  Pain-free cervical motion. Cardiovascular:     Rate and Rhythm: Normal rate and regular rhythm.     Heart sounds: Normal heart sounds. No murmur heard. Pulmonary:     Effort: Pulmonary effort is normal. No respiratory distress.     Breath sounds: Normal breath sounds. No wheezing or rhonchi.  Musculoskeletal:     Cervical back: Neck supple.  Lymphadenopathy:     Cervical: No cervical adenopathy.  Skin:    General: Skin is warm and dry.     Findings: No rash.  Neurological:     Mental Status: She is alert and oriented to person, place, and time.  Psychiatric:        Mood and Affect: Mood normal.        Behavior: Behavior normal.      Results for orders placed or performed in visit on 06/22/22  POC COVID-19  Result Value Ref Range   SARS Coronavirus 2 Ag Negative Negative  POCT Influenza A/B  Result Value Ref Range   Influenza A, POC Negative Negative   Influenza B, POC Negative Negative  POCT rapid strep A  Result Value Ref Range   Rapid Strep A Screen Negative Negative    Assessment & Plan:  Vanessa [name] is a 62 y.o. female . Congestion of nasal sinus - Plan: POC COVID-19, POCT Influenza A/B, POCT rapid strep A  Upper respiratory tract infection, unspecified type  Excessive cerumen in  left ear canal Likely viral illness, early symptoms just starting today.  Discussed potential for false negative COVID testing early on.  Recommended repeat tomorrow and Wednesday.  Symptomatic care discussed with Mucinex, Tylenol, fluids, rest, and RTC/ER precautions given.  Reassuring exam, vital signs at this time.  Recommended follow-up with her primary care provider for chronic sinus congestion to decide on next step in treatment/eval.  Note provided for work.  Also noted to have excess cerumen on left side, unable to remove as above.  Over-the-counter Debrox as option, with RTC precautions for lavage if needed.  No orders of the defined types were placed in this encounter.  Patient Instructions  COVID, flu and strep test were negative/normal here today.  As we discussed sometimes COVID test can be falsely negative initially and with your symptoms just starting this morning I recommend repeat COVID testing at home tomorrow and Wednesday.  If COVID test is negative on Wednesday and you are feeling better, including no fever off of fever reducing medicines, you could potentially return to work Thursday.  If you are unable to return to work that day, let us know and we can extend your note for work.  For now recommend fluids, rest, Mucinex for cough, saline nasal spray if needed for nasal congestion, sore throat lozenges such as Cepacol over-the-counter.  Hope you feel better soon.  Return to the clinic or go to the nearest emergency room if any of your symptoms worsen or new symptoms occur.   As we discussed there was a small amount of wax in the left ear but unfortunately that was too firm  for me to remove here in the office.  You can try over-the-counter Debrox, but if that is ineffective, schedule appointment and we can remove the wax in office.  Please follow-up with your primary care  provider to discuss the chronic sinus issues.  Upper Respiratory Infection, Adult An upper respiratory  infection (URI) is a common viral infection of the nose, throat, and upper air passages that lead to the lungs. The most common type of URI is the common cold. URIs usually get better on their own, without medical treatment. What are the causes? A URI is caused by a virus. You may catch a virus by: Breathing in droplets from an infected person's cough or sneeze. Touching something that has been exposed to the virus (is contaminated) and then touching your mouth, nose, or eyes. What increases the risk? You are more likely to get a URI if: You are very young or very old. You have close contact with others, such as at work, school, or a health care facility. You smoke. You have long-term (chronic) heart or lung disease. You have a weakened disease-fighting system (immune system). You have nasal allergies or asthma. You are experiencing a lot of stress. You have poor nutrition. What are the signs or symptoms? A URI usually involves some of the following symptoms: Runny or stuffy (congested) nose. Cough. Sneezing. Sore throat. Headache. Fatigue. Fever. Loss of appetite. Pain in your forehead, behind your eyes, and over your cheekbones (sinus pain). Muscle aches. Redness or irritation of the eyes. Pressure in the ears or face. How is this diagnosed? This condition may be diagnosed based on your medical history and symptoms, and a physical exam. Your health care provider may use a swab to take a mucus sample from your nose (nasal swab). This sample can be tested to determine what virus is causing the illness. How is this treated? URIs usually get better on their own within 7-10 days. Medicines cannot cure URIs, but your health care provider may recommend certain medicines to help relieve symptoms, such as: Over-the-counter cold medicines. Cough suppressants. Coughing is a type of defense against infection that helps to clear the respiratory system, so take these medicines only as  recommended by your health care provider. Fever-reducing medicines. Follow these instructions at home: Activity Rest as needed. If you have a fever, stay home from work or school until your fever is gone or until your health care provider says your URI cannot spread to other people (is no longer contagious). Your health care provider may have you wear a face mask to prevent your infection from spreading. Relieving symptoms Gargle with a mixture of salt and water 3-4 times a day or as needed. To make salt water, completely dissolve -1 tsp (3-6 g) of salt in 1 cup (237 mL) of warm water. Use a cool-mist humidifier to add moisture to the air. This can help you breathe more easily. Eating and drinking  Drink enough fluid to keep your urine pale yellow. Eat soups and other clear broths. General instructions  Take over-the-counter and prescription medicines only as told by your health care provider. These include cold medicines, fever reducers, and cough suppressants. Do not use any products that contain nicotine or tobacco. These products include cigarettes, chewing tobacco, and vaping devices, such as e-cigarettes. If you need help quitting, ask your health care provider. Stay away from secondhand smoke. Stay up to date on all immunizations, including the yearly (annual) flu vaccine. Keep all follow-up visits. This is important. How to prevent the spread of infection to others URIs can be contagious. To prevent the infection from spreading: Wash your hands with soap and water for at least 20 seconds. If soap and water are  not available, use hand sanitizer. Avoid touching your mouth, face, eyes, or nose. Cough or sneeze into a tissue or your sleeve or elbow instead of into your hand or into the air.  Contact a health care provider if: You are getting worse instead of better. You have a fever or chills. Your mucus is brown or red. You have yellow or brown discharge coming from your  nose. You have pain in your face, especially when you bend forward. You have swollen neck glands. You have pain while swallowing. You have white areas in the back of your throat. Get help right away if: You have shortness of breath that gets worse. You have severe or persistent: Headache. Ear pain. Sinus pain. Chest pain. You have chronic lung disease along with any of the following: Making high-pitched whistling sounds when you breathe, most often when you breathe out (wheezing). Prolonged cough (more than 14 days). Coughing up blood. A change in your usual mucus. You have a stiff neck. You have changes in your: Vision. Hearing. Thinking. Mood. These symptoms may be an emergency. Get help right away. Call 911. Do not wait to see if the symptoms will go away. Do not drive yourself to the hospital. Summary An upper respiratory infection (URI) is a common infection of the nose, throat, and upper air passages that lead to the lungs. A URI is caused by a virus. URIs usually get better on their own within 7-10 days. Medicines cannot cure URIs, but your health care provider may recommend certain medicines to help relieve symptoms. This information is not intended to replace advice given to you by your health care provider. Make sure you discuss any questions you have with your health care provider. Document Revised: 12/11/2020 Document Reviewed: 12/11/2020 Elsevier Patient Education  [place], Adult The ears produce a substance called earwax that helps keep bacteria out of the ear and protects the skin in the ear canal. Occasionally, earwax can build up in the ear and cause discomfort or hearing loss. What are the causes? This condition is caused by a buildup of earwax. Ear canals are self-cleaning. Ear wax is made in the outer part of the ear canal and generally falls out in small amounts over time. When the self-cleaning mechanism is not working, earwax  builds up and can cause decreased hearing and discomfort. Attempting to clean ears with cotton swabs can push the earwax deep into the ear canal and cause decreased hearing and pain. What increases the risk? This condition is more likely to develop in people who: Clean their ears often with cotton swabs. Pick at their ears. Use earplugs or in-ear headphones often, or wear hearing aids. The following factors may also make you more likely to develop this condition: Being female. Being of older age. Naturally producing more earwax. Having narrow ear canals. Having earwax that is overly thick or sticky. Having excess hair in the ear canal. Having eczema. Being dehydrated. What are the signs or symptoms? Symptoms of this condition include: Reduced or muffled hearing. A feeling of fullness in the ear or feeling that the ear is plugged. Fluid coming from the ear. Ear pain or an itchy ear. Ringing in the ear. Coughing. Balance problems. An obvious piece of earwax that can be seen inside the ear canal. How is this diagnosed? This condition may be diagnosed based on: Your symptoms. Your medical history. An ear exam. During the exam, your health care provider will look into your  ear with an instrument called an otoscope. You may have tests, including a hearing test. How is this treated? This condition may be treated by: Using ear drops to soften the earwax. Having the earwax removed by a health care provider. The health care provider may: Flush the ear with water. Use an instrument that has a loop on the end (curette). Use a suction device. Having surgery to remove the wax buildup. This may be done in severe cases. Follow these instructions at home:  Take over-the-counter and prescription medicines only as told by your health care provider. Do not put any objects, including cotton swabs, into your ear. You can clean the opening of your ear canal with a washcloth or facial tissue. Follow  instructions from your health care provider about cleaning your ears. Do not overclean your ears. Drink enough fluid to keep your urine pale yellow. This will help to thin the earwax. Keep all follow-up visits as told. If earwax builds up in your ears often or if you use hearing aids, consider seeing your health care provider for routine, preventive ear cleanings. Ask your health care provider how often you should schedule your cleanings. If you have hearing aids, clean them according to instructions from the manufacturer and your health care provider. Contact a health care provider if: You have ear pain. You develop a fever. You have pus or other fluid coming from your ear. You have hearing loss. You have ringing in your ears that does not go away. You feel like the room is spinning (vertigo). Your symptoms do not improve with treatment. Get help right away if: You have bleeding from the affected ear. You have severe ear pain. Summary Earwax can build up in the ear and cause discomfort or hearing loss. The most common symptoms of this condition include reduced or muffled hearing, a feeling of fullness in the ear, or feeling that the ear is plugged. This condition may be diagnosed based on your symptoms, your medical history, and an ear exam. This condition may be treated by using ear drops to soften the earwax or by having the earwax removed by a health care provider. Do not put any objects, including cotton swabs, into your ear. You can clean the opening of your ear canal with a washcloth or facial tissue. This information is not intended to replace advice given to you by your health care provider. Make sure you discuss any questions you have with your health care provider. Document Revised: 08/29/2019 Document Reviewed: 08/29/2019 Elsevier Patient Education  [place],   Merri Ray, MD [place], [facility] 06/22/22 12:40 PM

## 2022-06-22 NOTE — Patient Instructions (Addendum)
COVID, flu and strep test were negative/normal here today.  As we discussed sometimes COVID test can be falsely negative initially and with your symptoms just starting this morning I recommend repeat COVID testing at home tomorrow and Wednesday.  If COVID test is negative on Wednesday and you are feeling better, including no fever off of fever reducing medicines, you could potentially return to work Thursday.  If you are unable to return to work that day, let us know and we can extend your note for work.  For now recommend fluids, rest, Mucinex for cough, saline nasal spray if needed for nasal congestion, sore throat lozenges such as Cepacol over-the-counter.  Hope you feel better soon.  Return to the clinic or go to the nearest emergency room if any of your symptoms worsen or new symptoms occur.   As we discussed there was a small amount of wax in the left ear but unfortunately that was too firm  for me to remove here in the office.  You can try over-the-counter Debrox, but if that is ineffective, schedule appointment and we can remove the wax in office.  Please follow-up with your primary care provider to discuss the chronic sinus issues.  Upper Respiratory Infection, Adult An upper respiratory infection (URI) is a common viral infection of the nose, throat, and upper air passages that lead to the lungs. The most common type of URI is the common cold. URIs usually get better on their own, without medical treatment. What are the causes? A URI is caused by a virus. You may catch a virus by: Breathing in droplets from an infected person's cough or sneeze. Touching something that has been exposed to the virus (is contaminated) and then touching your mouth, nose, or eyes. What increases the risk? You are more likely to get a URI if: You are very young or very old. You have close contact with others, such as at work, school, or a health care facility. You smoke. You have long-term (chronic) heart or  lung disease. You have a weakened disease-fighting system (immune system). You have nasal allergies or asthma. You are experiencing a lot of stress. You have poor nutrition. What are the signs or symptoms? A URI usually involves some of the following symptoms: Runny or stuffy (congested) nose. Cough. Sneezing. Sore throat. Headache. Fatigue. Fever. Loss of appetite. Pain in your forehead, behind your eyes, and over your cheekbones (sinus pain). Muscle aches. Redness or irritation of the eyes. Pressure in the ears or face. How is this diagnosed? This condition may be diagnosed based on your medical history and symptoms, and a physical exam. Your health care provider may use a swab to take a mucus sample from your nose (nasal swab). This sample can be tested to determine what virus is causing the illness. How is this treated? URIs usually get better on their own within 7-10 days. Medicines cannot cure URIs, but your health care provider may recommend certain medicines to help relieve symptoms, such as: Over-the-counter cold medicines. Cough suppressants. Coughing is a type of defense against infection that helps to clear the respiratory system, so take these medicines only as recommended by your health care provider. Fever-reducing medicines. Follow these instructions at home: Activity Rest as needed. If you have a fever, stay home from work or school until your fever is gone or until your health care provider says your URI cannot spread to other people (is no longer contagious). Your health care provider may have you wear a  face mask to prevent your infection from spreading. Relieving symptoms Gargle with a mixture of salt and water 3-4 times a day or as needed. To make salt water, completely dissolve -1 tsp (3-6 g) of salt in 1 cup (237 mL) of warm water. Use a cool-mist humidifier to add moisture to the air. This can help you breathe more easily. Eating and drinking  Drink enough  fluid to keep your urine pale yellow. Eat soups and other clear broths. General instructions  Take over-the-counter and prescription medicines only as told by your health care provider. These include cold medicines, fever reducers, and cough suppressants. Do not use any products that contain nicotine or tobacco. These products include cigarettes, chewing tobacco, and vaping devices, such as e-cigarettes. If you need help quitting, ask your health care provider. Stay away from secondhand smoke. Stay up to date on all immunizations, including the yearly (annual) flu vaccine. Keep all follow-up visits. This is important. How to prevent the spread of infection to others URIs can be contagious. To prevent the infection from spreading: Wash your hands with soap and water for at least 20 seconds. If soap and water are not available, use hand sanitizer. Avoid touching your mouth, face, eyes, or nose. Cough or sneeze into a tissue or your sleeve or elbow instead of into your hand or into the air.  Contact a health care provider if: You are getting worse instead of better. You have a fever or chills. Your mucus is brown or red. You have yellow or brown discharge coming from your nose. You have pain in your face, especially when you bend forward. You have swollen neck glands. You have pain while swallowing. You have white areas in the back of your throat. Get help right away if: You have shortness of breath that gets worse. You have severe or persistent: Headache. Ear pain. Sinus pain. Chest pain. You have chronic lung disease along with any of the following: Making high-pitched whistling sounds when you breathe, most often when you breathe out (wheezing). Prolonged cough (more than 14 days). Coughing up blood. A change in your usual mucus. You have a stiff neck. You have changes in your: Vision. Hearing. Thinking. Mood. These symptoms may be an emergency. Get help right away. Call  911. Do not wait to see if the symptoms will go away. Do not drive yourself to the hospital. Summary An upper respiratory infection (URI) is a common infection of the nose, throat, and upper air passages that lead to the lungs. A URI is caused by a virus. URIs usually get better on their own within 7-10 days. Medicines cannot cure URIs, but your health care provider may recommend certain medicines to help relieve symptoms. This information is not intended to replace advice given to you by your health care provider. Make sure you discuss any questions you have with your health care provider. Document Revised: 12/11/2020 Document Reviewed: 12/11/2020 Elsevier Patient Education  [place], Adult The ears produce a substance called earwax that helps keep bacteria out of the ear and protects the skin in the ear canal. Occasionally, earwax can build up in the ear and cause discomfort or hearing loss. What are the causes? This condition is caused by a buildup of earwax. Ear canals are self-cleaning. Ear wax is made in the outer part of the ear canal and generally falls out in small amounts over time. When the self-cleaning mechanism is not working, earwax builds up and  can cause decreased hearing and discomfort. Attempting to clean ears with cotton swabs can push the earwax deep into the ear canal and cause decreased hearing and pain. What increases the risk? This condition is more likely to develop in people who: Clean their ears often with cotton swabs. Pick at their ears. Use earplugs or in-ear headphones often, or wear hearing aids. The following factors may also make you more likely to develop this condition: Being female. Being of older age. Naturally producing more earwax. Having narrow ear canals. Having earwax that is overly thick or sticky. Having excess hair in the ear canal. Having eczema. Being dehydrated. What are the signs or symptoms? Symptoms of  this condition include: Reduced or muffled hearing. A feeling of fullness in the ear or feeling that the ear is plugged. Fluid coming from the ear. Ear pain or an itchy ear. Ringing in the ear. Coughing. Balance problems. An obvious piece of earwax that can be seen inside the ear canal. How is this diagnosed? This condition may be diagnosed based on: Your symptoms. Your medical history. An ear exam. During the exam, your health care provider will look into your ear with an instrument called an otoscope. You may have tests, including a hearing test. How is this treated? This condition may be treated by: Using ear drops to soften the earwax. Having the earwax removed by a health care provider. The health care provider may: Flush the ear with water. Use an instrument that has a loop on the end (curette). Use a suction device. Having surgery to remove the wax buildup. This may be done in severe cases. Follow these instructions at home:  Take over-the-counter and prescription medicines only as told by your health care provider. Do not put any objects, including cotton swabs, into your ear. You can clean the opening of your ear canal with a washcloth or facial tissue. Follow instructions from your health care provider about cleaning your ears. Do not overclean your ears. Drink enough fluid to keep your urine pale yellow. This will help to thin the earwax. Keep all follow-up visits as told. If earwax builds up in your ears often or if you use hearing aids, consider seeing your health care provider for routine, preventive ear cleanings. Ask your health care provider how often you should schedule your cleanings. If you have hearing aids, clean them according to instructions from the manufacturer and your health care provider. Contact a health care provider if: You have ear pain. You develop a fever. You have pus or other fluid coming from your ear. You have hearing loss. You have ringing  in your ears that does not go away. You feel like the room is spinning (vertigo). Your symptoms do not improve with treatment. Get help right away if: You have bleeding from the affected ear. You have severe ear pain. Summary Earwax can build up in the ear and cause discomfort or hearing loss. The most common symptoms of this condition include reduced or muffled hearing, a feeling of fullness in the ear, or feeling that the ear is plugged. This condition may be diagnosed based on your symptoms, your medical history, and an ear exam. This condition may be treated by using ear drops to soften the earwax or by having the earwax removed by a health care provider. Do not put any objects, including cotton swabs, into your ear. You can clean the opening of your ear canal with a washcloth or facial tissue. This information is not  intended to replace advice given to you by your health care provider. Make sure you discuss any questions you have with your health care provider. Document Revised: 08/29/2019 Document Reviewed: 08/29/2019 Elsevier Patient Education  2023 [employer].

## 2022-07-23 ENCOUNTER — Other Ambulatory Visit: Payer: Self-pay | Admitting: Family Medicine

## 2022-07-24 ENCOUNTER — Emergency Department (HOSPITAL_COMMUNITY): Payer: Worker's Compensation

## 2022-07-24 ENCOUNTER — Other Ambulatory Visit: Payer: Self-pay

## 2022-07-24 ENCOUNTER — Encounter (HOSPITAL_COMMUNITY): Payer: Self-pay

## 2022-07-24 ENCOUNTER — Emergency Department (HOSPITAL_COMMUNITY)
Admission: EM | Admit: 2022-07-24 | Discharge: 2022-07-24 | Disposition: A | Discharge status: Home / Self Care | Payer: Worker's Compensation | Attending: Emergency Medicine | Admitting: Emergency Medicine

## 2022-07-24 DIAGNOSIS — S80212A Abrasion, left knee, initial encounter: Secondary | ICD-10-CM | POA: Insufficient documentation

## 2022-07-24 DIAGNOSIS — S0990XA Unspecified injury of head, initial encounter: Secondary | ICD-10-CM | POA: Diagnosis present

## 2022-07-24 DIAGNOSIS — Y990 Civilian activity done for income or pay: Secondary | ICD-10-CM | POA: Insufficient documentation

## 2022-07-24 DIAGNOSIS — W01198A Fall on same level from slipping, tripping and stumbling with subsequent striking against other object, initial encounter: Secondary | ICD-10-CM | POA: Diagnosis not present

## 2022-07-24 DIAGNOSIS — W19XXXA Unspecified fall, initial encounter: Secondary | ICD-10-CM

## 2022-07-24 NOTE — Telephone Encounter (Signed)
Unable to reach pt no answer and no vm set up

## 2022-07-24 NOTE — ED Triage Notes (Signed)
Pt states that she was at work and tripped over a blood pressure cuff around 1300. Pt states that she hit the back of her head. No LOC. Ambulatory to triage. Sent here from [facility]" to get CT scan.

## 2022-07-24 NOTE — Telephone Encounter (Signed)
Xanax 0.5 mg LOV: 06/22/22 Last Refill:05/26/22 Upcoming appt: none

## 2022-07-24 NOTE — ED Provider Notes (Signed)
[name] Provider Note   CSN: [id number] Arrival date & time: 07/24/22  1613     History  Chief Complaint  Patient presents with   Vanessa [name]    Vanessa [name] is a 62 y.o. female.  62 year old female with prior medical history as detailed below presents for evaluation.  Patient reports that she tripped while at work this afternoon around 1:00 PM.  She fell backwards and struck the back of her head against the floor.  She denies LOC.  She does report brief period of feeling "stunned".  She was advised by her employer to come to the ED for evaluation and get CT scan of her head.  She denies nausea, vomiting, neck pain.  She did have a small abrasion to her left knee.  She denies knee pain.  She denies difficulty ambulating.  She took Tylenol prior to arrival with some improvement in her posterior scalp pain.  The history is provided by the patient and medical records.       Home Medications Prior to Admission medications   Medication Sig Start Date End Date Taking? Authorizing Provider  albuterol (VENTOLIN HFA) 108 (90 Base) MCG/ACT inhaler Inhale 2 puffs into the lungs every 4 (four) hours as needed. 05/08/22   [provider]  ALPRAZolam Duanne Moron) 0.5 MG tablet TAKE 1 TABLET BY MOUTH TWICE A DAY AS NEEDED FOR ANXIETY 07/24/22   Midge Minium, MD  ascorbic acid (VITAMIN C) 1000 MG tablet Take 1,000 mg by mouth daily.  01/30/16   [provider]  baclofen (LIORESAL) 10 MG tablet Take 0.5-1 tablets (5-10 mg total) by mouth at bedtime as needed for muscle spasms. 12/04/20   Hilts, Legrand Como, MD  benazepril-hydrochlorthiazide (LOTENSIN HCT) 10-12.5 MG tablet TAKE 1 TABLET BY MOUTH EVERY DAY 04/09/22   Midge Minium, MD  cetirizine (ZYRTEC) 10 MG tablet Take 1 tablet (10 mg total) by mouth daily. 12/30/21   Midge Minium, MD  fluticasone (FLONASE) 50 MCG/ACT nasal spray SPRAY 2 SPRAYS INTO EACH NOSTRIL EVERY DAY  03/30/22   Midge Minium, MD  ibuprofen (ADVIL) 800 MG tablet TAKE 1 TABLET BY MOUTH THREE TIMES A DAY AS NEEDED 05/11/22   Midge Minium, MD  Multiple Vitamin (MULTIVITAMIN WITH MINERALS) TABS tablet Take 1 tablet by mouth daily.    [provider]  predniSONE (DELTASONE) 10 MG tablet 3 tabs x3 days and then 2 tabs x3 days and then 1 tab x3 days.  Take w/ food. Patient not taking: Reported on 06/22/2022 04/01/22   Midge Minium, MD  SUBOXONE 8-2 MG FILM Place 1 Film under the tongue 2 (two) times daily.  04/18/13   [provider]  venlafaxine XR (EFFEXOR-XR) 150 MG 24 hr capsule Take 1 capsule (150 mg total) by mouth daily with breakfast. 06/09/22   Midge Minium, MD  venlafaxine XR (EFFEXOR-XR) 75 MG 24 hr capsule Take 1 capsule (75 mg total) by mouth daily with breakfast. 06/10/22   Midge Minium, MD      Allergies    Patient has no known allergies.    Review of Systems   Review of Systems  All other systems reviewed and are negative.   Physical Exam Updated Vital Signs BP (!) 153/93 (BP Location: Left Arm)   Pulse 87   Temp 98 F (36.7 C) (Oral)   Resp 18   Ht '5\' 1"'$  (1.549 m)   Wt 85.7 kg  SpO2 97%   BMI 35.71 kg/m  Physical Exam Vitals and nursing note reviewed.  Constitutional:      General: She is not in acute distress.    Appearance: Normal appearance. She is well-developed.  HENT:     Head: Normocephalic and atraumatic.  Eyes:     Conjunctiva/sclera: Conjunctivae normal.     Pupils: Pupils are equal, round, and reactive to light.  Cardiovascular:     Rate and Rhythm: Normal rate and regular rhythm.     Heart sounds: Normal heart sounds.  Pulmonary:     Effort: Pulmonary effort is normal. No respiratory distress.     Breath sounds: Normal breath sounds.  Abdominal:     General: There is no distension.     Palpations: Abdomen is soft.     Tenderness: There is no abdominal tenderness.  Musculoskeletal:         General: No deformity. Normal range of motion.     Cervical back: Normal range of motion and neck supple.  Skin:    General: Skin is warm and dry.     Comments: Superficial abrasion to left knee  Full AROM of left knee. No tenderness to knee. Stable Gait.  Neurological:     General: No focal deficit present.     Mental Status: She is alert and oriented to person, place, and time.     Comments: Alert, oriented x 3, GCS 15     ED Results / Procedures / Treatments   Labs (all labs ordered are listed, but only abnormal results are displayed) Labs Reviewed - No data to display  EKG None  Radiology CT Head Wo Contrast  Result Date: 07/24/2022 CLINICAL DATA:  Fall, posterior head injury EXAM: CT HEAD WITHOUT CONTRAST TECHNIQUE: Contiguous axial images were obtained from the base of the skull through the vertex without intravenous contrast. RADIATION DOSE REDUCTION: This exam was performed according to the departmental dose-optimization program which includes automated exposure control, adjustment of the mA and/or kV according to patient size and/or use of iterative reconstruction technique. COMPARISON:  None Available. FINDINGS: Brain: No evidence of acute infarction, hemorrhage, hydrocephalus, extra-axial collection or mass lesion/mass effect. Vascular: No hyperdense vessel or unexpected calcification. Skull: Normal. Negative for fracture or focal lesion. Sinuses/Orbits: The visualized paranasal sinuses are essentially clear. The mastoid air cells are unopacified. Other: None. IMPRESSION: Normal head CT. Electronically Signed   By: Julian Hy M.D.   On: 07/24/2022 17:17    Procedures Procedures    Medications Ordered in ED Medications - No data to display  ED Course/ Medical Decision Making/ A&P                             Medical Decision Making Amount and/or Complexity of Data Reviewed Radiology: ordered.    Medical Screen Complete  This patient presented to the ED  with complaint of fall, head injury.  This complaint involves an extensive number of treatment options. The initial differential diagnosis includes, but is not limited to, trauma related to fall  This presentation is: Acute, Self-Limited, Previously Undiagnosed, Uncertain Prognosis, Complicated, Systemic Symptoms, and Threat to Life/Bodily Function  Patient presents for evaluation after fall at work.  She did strike her head against the floor.  Incident occurred approximately 3-1/2 hours prior to arrival to the ED.  Patient is neurologically intact on evaluation.  CT imaging reveals no acute pathology.  Patient reassured by workup findings.  Importance  of close follow-up stressed.  Strict return precautions given and understood.  Additional history obtained:  External records from outside sources obtained and reviewed including prior ED visits and prior Inpatient records.    Imaging Studies ordered:  I ordered imaging studies including CT Head  I independently visualized and interpreted obtained imaging which showed NAD I agree with the radiologist interpretation.  Problem List / ED Course:  Fall, Head Injury   Reevaluation:  After the interventions noted above, I reevaluated the patient and found that they have: improved   Disposition:  After consideration of the diagnostic results and the patients response to treatment, I feel that the patent would benefit from close outpatient followup.          Final Clinical Impression(s) / ED Diagnoses Final diagnoses:  Fall, initial encounter  Injury of head, initial encounter    Rx / DC Orders ED Discharge Orders     None         Valarie Merino, MD 07/24/22 1727

## 2022-07-24 NOTE — Discharge Instructions (Signed)
Return for any problem.  ?

## 2022-08-17 ENCOUNTER — Other Ambulatory Visit: Payer: Self-pay | Admitting: Family Medicine

## 2022-09-24 ENCOUNTER — Other Ambulatory Visit: Payer: Self-pay | Admitting: Family Medicine

## 2022-09-24 DIAGNOSIS — F331 Major depressive disorder, recurrent, moderate: Secondary | ICD-10-CM

## 2022-09-24 NOTE — Telephone Encounter (Signed)
Patient is requesting a refill of the following medications: Requested Prescriptions   Pending Prescriptions Disp Refills   venlafaxine XR (EFFEXOR-XR) 75 MG 24 hr capsule [Pharmacy Med Name: VENLAFAXINE HCL ER 75 MG CAP] 90 capsule 1    Sig: TAKE 1 CAPSULE BY MOUTH DAILY WITH BREAKFAST.   ALPRAZolam (XANAX) 0.5 MG tablet [Pharmacy Med Name: ALPRAZOLAM 0.5 MG TABLET] 60 tablet 0    Sig: TAKE 1 TABLET BY MOUTH TWICE A DAY AS NEEDED FOR ANXIETY    Date of patient request: 09/24/22 Last office visit: 06/22/2022 Date of last refill: 06/10/2022 Last refill amount: 90

## 2022-10-28 ENCOUNTER — Ambulatory Visit: Payer: 59 | Admitting: Family Medicine

## 2022-10-29 ENCOUNTER — Ambulatory Visit: Payer: 59 | Admitting: Family Medicine

## 2022-11-05 ENCOUNTER — Other Ambulatory Visit: Payer: Self-pay | Admitting: Family Medicine

## 2022-11-05 NOTE — Telephone Encounter (Signed)
Patient is requesting a refill of the following medications: Requested Prescriptions   Pending Prescriptions Disp Refills   ibuprofen (ADVIL) 800 MG tablet [Pharmacy Med Name: IBUPROFEN 800 MG TABLET] 90 tablet 0    Sig: TAKE 1 TABLET BY MOUTH THREE TIMES A DAY AS NEEDED    Date of patient request: 11/05/22 Last office visit: 06/10/22 Date of last refill: 08/17/22 Last refill amount: 90

## 2022-11-19 ENCOUNTER — Other Ambulatory Visit: Payer: Self-pay | Admitting: Family Medicine

## 2022-11-19 NOTE — Telephone Encounter (Signed)
Sent pt a mychart message. 

## 2022-11-19 NOTE — Telephone Encounter (Signed)
Xanax 0.5 mg LOV: 04/01/22 Last Refill:09/24/22 Upcoming appt: 11/23/22

## 2022-11-23 ENCOUNTER — Encounter: Payer: 59 | Admitting: Family Medicine

## 2022-11-23 DIAGNOSIS — F331 Major depressive disorder, recurrent, moderate: Secondary | ICD-10-CM

## 2022-12-07 ENCOUNTER — Ambulatory Visit (INDEPENDENT_AMBULATORY_CARE_PROVIDER_SITE_OTHER): Payer: 59 | Admitting: Family Medicine

## 2022-12-07 ENCOUNTER — Encounter: Payer: Self-pay | Admitting: Family Medicine

## 2022-12-07 VITALS — BP 118/80 | HR 87 | Temp 98.4°F | Resp 18 | Ht 61.0 in | Wt 193.5 lb

## 2022-12-07 DIAGNOSIS — Z1231 Encounter for screening mammogram for malignant neoplasm of breast: Secondary | ICD-10-CM

## 2022-12-07 DIAGNOSIS — Z0000 Encounter for general adult medical examination without abnormal findings: Secondary | ICD-10-CM

## 2022-12-07 DIAGNOSIS — E559 Vitamin D deficiency, unspecified: Secondary | ICD-10-CM | POA: Diagnosis not present

## 2022-12-07 LAB — CBC WITH DIFFERENTIAL/PLATELET
Basophils Absolute: 0.1 10*3/uL (ref 0.0–0.1)
Basophils Relative: 1.3 % (ref 0.0–3.0)
Eosinophils Absolute: 0 10*3/uL (ref 0.0–0.7)
Eosinophils Relative: 0.6 % (ref 0.0–5.0)
HCT: 39.2 % (ref 36.0–46.0)
Hemoglobin: 12.9 g/dL (ref 12.0–15.0)
Lymphocytes Relative: 29.8 % (ref 12.0–46.0)
Lymphs Abs: 2.4 10*3/uL (ref 0.7–4.0)
MCHC: 32.9 g/dL (ref 30.0–36.0)
MCV: 88.1 fl (ref 78.0–100.0)
Monocytes Absolute: 0.3 10*3/uL (ref 0.1–1.0)
Monocytes Relative: 4.2 % (ref 3.0–12.0)
Neutro Abs: 5.1 10*3/uL (ref 1.4–7.7)
Neutrophils Relative %: 64.1 % (ref 43.0–77.0)
Platelets: 258 10*3/uL (ref 150.0–400.0)
RBC: 4.45 Mil/uL (ref 3.87–5.11)
RDW: 14.9 % (ref 11.5–15.5)
WBC: 7.9 10*3/uL (ref 4.0–10.5)

## 2022-12-07 LAB — TSH: TSH: 1.98 u[IU]/mL (ref 0.35–5.50)

## 2022-12-07 LAB — VITAMIN D 25 HYDROXY (VIT D DEFICIENCY, FRACTURES): VITD: 15.83 ng/mL — ABNORMAL LOW (ref 30.00–100.00)

## 2022-12-07 NOTE — Assessment & Plan Note (Signed)
Pt's BMI is 36.56 and coupled w/ her HTN and hyperlipidemia, this qualifies as morbidly obese.  Encouraged low carb diet and regular exercise.  Check labs to risk stratify.  Will follow.

## 2022-12-07 NOTE — Assessment & Plan Note (Signed)
Check labs and replete prn. 

## 2022-12-07 NOTE — Patient Instructions (Signed)
Follow up in 6 months to recheck blood pressure We'll notify you of your lab results and make any changes if needed Continue to work on healthy diet and regular exercise- you can do it! Get your mammogram- the order is in Call with any questions or concerns Stay Safe!  Stay Healthy! Hang in there!!!

## 2022-12-07 NOTE — Assessment & Plan Note (Signed)
Pt's PE WNL w/ exception of BMI.  UTD on pap, colonoscopy, Tdap.  Order for mammo placed.  Check labs.  Anticipatory guidance provided.

## 2022-12-07 NOTE — Progress Notes (Signed)
   Subjective:    Patient ID: Vanessa [name], female    DOB: May 05, 1961, 62 y.o.   MRN: [id number]  HPI CPE- UTD on pap, colonoscopy, Tdap.  Due for mammo.  Pt declines lung cancer screen  Patient Care Team    Relationship Specialty Notifications Start End  Sheliah Hatch, MD PCP - General Family Medicine  04/27/13    Comment: Ricki Rodriguez, Malena Catholic, MD Referring Physician Anesthesiology  07/17/14   Rachael Fee, MD Attending Physician Gastroenterology  07/17/14     Health Maintenance  Topic Date Due   Lung Cancer Screening  Never done   MAMMOGRAM  02/02/2020   Zoster Vaccines- Shingrix (1 of 2) 03/09/2023 (Originally 07/23/2011)   INFLUENZA VACCINE  12/24/2022   PAP SMEAR-Modifier  07/13/2023   Colonoscopy  11/15/2023   DTaP/Tdap/Td (2 - Td or Tdap) 08/04/2025   Hepatitis C Screening  Completed   HIV Screening  Completed   HPV VACCINES  Aged Out   COVID-19 Vaccine  Discontinued      Review of Systems Patient reports no vision/ hearing changes, adenopathy,fever,  persistant/recurrent hoarseness , swallowing issues, chest pain, palpitations, edema, persistant/recurrent cough, hemoptysis, dyspnea (rest/exertional/paroxysmal nocturnal), gastrointestinal bleeding (melena, rectal bleeding), abdominal pain, significant heartburn, bowel changes, GU symptoms (dysuria, hematuria, incontinence), Gyn symptoms (abnormal  bleeding, pain),  syncope, focal weakness, memory loss, numbness & tingling, skin/hair/nail changes, abnormal bruising or bleeding, anxiety, or depression.   + 5 lb weight gain    Objective:   Physical Exam General Appearance:    Alert, cooperative, no distress, appears stated age, obese  Head:    Normocephalic, without obvious abnormality, atraumatic  Eyes:    PERRL, conjunctiva/corneas clear, EOM's intact both eyes  Ears:    Normal TM's and external ear canals, both ears  Nose:   Nares normal, septum midline, mucosa normal, no drainage    or sinus tenderness   Throat:   Lips, mucosa, and tongue normal; teeth and gums normal  Neck:   Supple, symmetrical, trachea midline, no adenopathy;    Thyroid: no enlargement/tenderness/nodules  Back:     Symmetric, no curvature, ROM normal, no CVA tenderness  Lungs:     Clear to auscultation bilaterally, respirations unlabored  Chest Wall:    No tenderness or deformity   Heart:    Regular rate and rhythm, S1 and S2 normal, no murmur, rub   or gallop  Breast Exam:    Deferred to mammo  Abdomen:     Soft, non-tender, bowel sounds active all four quadrants,    no masses, no organomegaly  Genitalia:    Deferred to GYN  Rectal:    Extremities:   Extremities normal, atraumatic, no cyanosis or edema  Pulses:   2+ and symmetric all extremities  Skin:   Skin color, texture, turgor normal, no rashes or lesions  Lymph nodes:   Cervical, supraclavicular, and axillary nodes normal  Neurologic:   CNII-XII intact, normal strength, sensation and reflexes    throughout          Assessment & Plan:

## 2022-12-08 ENCOUNTER — Ambulatory Visit (INDEPENDENT_AMBULATORY_CARE_PROVIDER_SITE_OTHER): Payer: 59

## 2022-12-08 ENCOUNTER — Telehealth: Payer: Self-pay

## 2022-12-08 ENCOUNTER — Other Ambulatory Visit: Payer: Self-pay

## 2022-12-08 DIAGNOSIS — R739 Hyperglycemia, unspecified: Secondary | ICD-10-CM

## 2022-12-08 DIAGNOSIS — E559 Vitamin D deficiency, unspecified: Secondary | ICD-10-CM

## 2022-12-08 LAB — HEPATIC FUNCTION PANEL
ALT: 18 U/L (ref 0–35)
AST: 19 U/L (ref 0–37)
Albumin: 4.1 g/dL (ref 3.5–5.2)
Alkaline Phosphatase: 53 U/L (ref 39–117)
Bilirubin, Direct: 0 mg/dL (ref 0.0–0.3)
Total Bilirubin: 0.3 mg/dL (ref 0.2–1.2)
Total Protein: 7.4 g/dL (ref 6.0–8.3)

## 2022-12-08 LAB — LIPID PANEL
Cholesterol: 203 mg/dL — ABNORMAL HIGH (ref 0–200)
HDL: 48.8 mg/dL (ref >39.00)
NonHDL: 154.27
Total CHOL/HDL Ratio: 4
Triglycerides: 207 mg/dL — ABNORMAL HIGH (ref 0.0–149.0)
VLDL: 41.4 mg/dL — ABNORMAL HIGH (ref 0.0–40.0)

## 2022-12-08 LAB — BASIC METABOLIC PANEL
BUN: 16 mg/dL (ref 6–23)
CO2: 28 mEq/L (ref 19–32)
Calcium: 9.3 mg/dL (ref 8.4–10.5)
Chloride: 100 mEq/L (ref 96–112)
Creatinine, Ser: 0.73 mg/dL (ref 0.40–1.20)
GFR: 88.79 mL/min (ref >60.00)
Glucose, Bld: 113 mg/dL — ABNORMAL HIGH (ref 70–99)
Potassium: 3.6 mEq/L (ref 3.5–5.1)
Sodium: 138 mEq/L (ref 135–145)

## 2022-12-08 LAB — HEMOGLOBIN A1C: Hgb A1c MFr Bld: 6.3 % (ref 4.6–6.5)

## 2022-12-08 LAB — LDL CHOLESTEROL, DIRECT: Direct LDL: 126 mg/dL

## 2022-12-08 MED ORDER — VITAMIN D (ERGOCALCIFEROL) 1.25 MG (50000 UNIT) PO CAPS
50000.0000 [IU] | ORAL_CAPSULE | ORAL | 0 refills | Status: DC
Start: 2022-12-08 — End: 2023-07-09

## 2022-12-08 NOTE — Telephone Encounter (Signed)
-----   Message from Neena Rhymes sent at 12/08/2022  7:33 AM EDT ----- Your sugar is elevated so we are going to add an A1C to assess for diabetes (dx hyperglycemia)  Vit D is low.  Based on this, we need to start 50,000 units weekly x12 weeks in addition to daily OTC supplement of at least 2000 units.   Remainder of labs look good!

## 2022-12-08 NOTE — Telephone Encounter (Signed)
Pt has been notified. Pt has no concerns

## 2022-12-09 ENCOUNTER — Telehealth: Payer: Self-pay

## 2022-12-09 NOTE — Telephone Encounter (Signed)
Pt Vm is not set up no answer . I have sent her results thru my chart

## 2022-12-09 NOTE — Telephone Encounter (Signed)
-----   Message from Neena Rhymes sent at 12/08/2022  5:24 PM EDT ----- You are in the pre-diabetes range.  Please be mindful of your sugar and carb intake and try and get regular physical activity/exercise.

## 2023-01-19 ENCOUNTER — Other Ambulatory Visit: Payer: Self-pay | Admitting: Family Medicine

## 2023-01-19 NOTE — Telephone Encounter (Signed)
Xanax 0.5 mg Requested Prescriptions   Pending Prescriptions Disp Refills   ALPRAZolam (XANAX) 0.5 MG tablet [Pharmacy Med Name: ALPRAZOLAM 0.5 MG TABLET] 60 tablet 0    Sig: TAKE 1 TABLET BY MOUTH TWICE A DAY AS NEEDED FOR ANXIETY     Date of patient request: 01/19/23 Last office visit: 12/07/22 Date of last refill: 11/19/22 Last refill amount: 60 Follow up time period per chart: 6 month

## 2023-02-06 ENCOUNTER — Other Ambulatory Visit: Payer: Self-pay | Admitting: Family Medicine

## 2023-02-06 DIAGNOSIS — F331 Major depressive disorder, recurrent, moderate: Secondary | ICD-10-CM

## 2023-02-08 ENCOUNTER — Other Ambulatory Visit: Payer: Self-pay | Admitting: Family Medicine

## 2023-03-04 ENCOUNTER — Encounter: Payer: Self-pay | Admitting: Family Medicine

## 2023-03-04 ENCOUNTER — Ambulatory Visit: Payer: 59 | Admitting: Family Medicine

## 2023-03-04 VITALS — BP 122/78 | HR 80 | Temp 97.7°F | Ht 61.0 in | Wt 189.2 lb

## 2023-03-04 DIAGNOSIS — B9689 Other specified bacterial agents as the cause of diseases classified elsewhere: Secondary | ICD-10-CM | POA: Diagnosis not present

## 2023-03-04 DIAGNOSIS — M25512 Pain in left shoulder: Secondary | ICD-10-CM | POA: Diagnosis not present

## 2023-03-04 DIAGNOSIS — J329 Chronic sinusitis, unspecified: Secondary | ICD-10-CM | POA: Diagnosis not present

## 2023-03-04 MED ORDER — PREDNISONE 10 MG PO TABS: ORAL_TABLET | ORAL | 0 refills | Status: DC

## 2023-03-04 MED ORDER — AMOXICILLIN 875 MG PO TABS: 875.0000 mg | ORAL_TABLET | Freq: Two times a day (BID) | ORAL | 0 refills | Status: AC

## 2023-03-04 NOTE — Progress Notes (Signed)
Subjective:    Patient ID: Vanessa [name], female    DOB: 29-Apr-1961, 62 y.o.   MRN: [id number]  HPI L shoulder pain- pt reports she is unable to lift arm overhead.  Sxs started ~2 weeks ago.  No known injury.  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  Pain w/ bringing arm across body.  Pain w/ internal rotation.  Taking ibuprofen 800mg  w/ some relief.    Frontal HA- 'my head hurts every day'.  Also started ~2 weeks ago.  HA is always frontal.  Does not wake w/ HA but HA will develop as day goes on.  No sensitivity to light or sound.  No nausea.  Pt reports feeling as if sinuses are full.  + nasal congestion.  No ear pain.   Review of Systems For ROS see HPI     Objective:   Physical Exam Vitals reviewed.  Constitutional:      General: She is not in acute distress.    Appearance: She is well-developed. She is not ill-appearing.  HENT:     Head: Normocephalic and atraumatic.     Right Ear: Tympanic membrane normal.     Left Ear: Tympanic membrane normal.     Nose: Mucosal edema and congestion present. No rhinorrhea.     Right Sinus: Maxillary sinus tenderness and frontal sinus tenderness present.     Left Sinus: Maxillary sinus tenderness and frontal sinus tenderness present.     Mouth/Throat:     Pharynx: Uvula midline. Posterior oropharyngeal erythema present. No oropharyngeal exudate.  Eyes:     Conjunctiva/sclera: Conjunctivae normal.     Pupils: Pupils are equal, round, and reactive to light.  Cardiovascular:     Rate and Rhythm: Normal rate and regular rhythm.     Heart sounds: Normal heart sounds.  Pulmonary:     Effort: Pulmonary effort is normal. No respiratory distress.     Breath sounds: Normal breath sounds. No wheezing.  Musculoskeletal:     Cervical back: Normal range of motion and neck supple.     Comments: + hawkings L shoulder + pain w/ internal rotation + limited overhead motion due to pain  Lymphadenopathy:      Cervical: No cervical adenopathy.  Skin:    General: Skin is warm and dry.  Neurological:     General: No focal deficit present.     Mental Status: She is alert and oriented to person, place, and time.     Cranial Nerves: No cranial nerve deficit.     Motor: No weakness.     Coordination: Coordination normal.  Psychiatric:        Mood and Affect: Mood normal.        Behavior: Behavior normal.        Thought Content: Thought content normal.           Assessment & Plan:   L shoulder pain- new.  Pt's pain started ~2 weeks ago.  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  Has very limited overhead motion.  Pain w/ reaching across body and internal rotation.  Start prednisone taper.  Refer to Ortho.  Work note provided.  Pt expressed understanding and is in agreement w/ plan.   Bacterial sinusitis- new.  Pt's frontal HA is most likely due to current sinus inflammation/infection.  Start Amoxicillin.  Reviewed supportive care and red flags that should  prompt return.  Pt expressed understanding and is in agreement w/ plan.

## 2023-03-04 NOTE — Patient Instructions (Signed)
Follow up as needed or as scheduled START the Prednisone as directed- 3 pills at the same time x3 days, then 2 pills at the same time x3 days, then 1 pill daily.  Take w/ food  TAKE the Amoxicillin twice daily- take w/ food Drink LOTS of fluids We'll call you to schedule your Orthopedic appt for the shoulder pain Call with any questions or concerns Stay Safe!  Stay Healthy! Hang in there!

## 2023-03-09 ENCOUNTER — Other Ambulatory Visit: Payer: Self-pay | Admitting: Family Medicine

## 2023-03-09 DIAGNOSIS — F331 Major depressive disorder, recurrent, moderate: Secondary | ICD-10-CM

## 2023-03-24 NOTE — Telephone Encounter (Signed)
Encourage patient to contact the pharmacy for refills or they can request refills through [facility]  WHAT PHARMACY WOULD THEY LIKE THIS SENT TO:  [address] [place], [place] [address]   MEDICATION NAME & DOSE: ALPRAZolam (XANAX) 0.5 MG tablet   NOTES/COMMENTS FROM PATIENT:     Front office please notify patient: It takes 48-72 hours to process rx refill requests Ask patient to call pharmacy to ensure rx is ready before heading there.

## 2023-03-25 ENCOUNTER — Other Ambulatory Visit: Payer: Self-pay

## 2023-03-25 NOTE — Telephone Encounter (Signed)
Patient is requesting a refill of the following medications: Requested Prescriptions   Pending Prescriptions Disp Refills   ALPRAZolam (XANAX) 0.5 MG tablet 60 tablet 0    Sig: TAKE 1 TABLET BY MOUTH TWICE A DAY AS NEEDED FOR ANXIETY    Date of patient request: 03/25/23 Last office visit: 03/04/23 Date of last refill: 01/20/23 Last refill amount: 60 Follow up time period per chart: 6 months

## 2023-03-26 MED ORDER — ALPRAZOLAM 0.5 MG PO TABS: ORAL_TABLET | ORAL | 0 refills | Status: DC

## 2023-04-06 ENCOUNTER — Encounter: Payer: Self-pay | Admitting: Orthopaedic Surgery

## 2023-04-06 ENCOUNTER — Other Ambulatory Visit (INDEPENDENT_AMBULATORY_CARE_PROVIDER_SITE_OTHER): Payer: 59

## 2023-04-06 ENCOUNTER — Ambulatory Visit: Payer: 59 | Admitting: Orthopaedic Surgery

## 2023-04-06 DIAGNOSIS — G8929 Other chronic pain: Secondary | ICD-10-CM

## 2023-04-06 DIAGNOSIS — M25512 Pain in left shoulder: Secondary | ICD-10-CM

## 2023-04-06 MED ORDER — BUPIVACAINE HCL 0.5 % IJ SOLN
3.0000 mL | INTRAMUSCULAR | Status: AC | PRN
  Administered 2023-04-06: 3 mL via INTRA_ARTICULAR

## 2023-04-06 MED ORDER — METHYLPREDNISOLONE ACETATE 40 MG/ML IJ SUSP
40.0000 mg | INTRAMUSCULAR | Status: AC | PRN
  Administered 2023-04-06: 40 mg via INTRA_ARTICULAR

## 2023-04-06 MED ORDER — LIDOCAINE HCL 1 % IJ SOLN
3.0000 mL | INTRAMUSCULAR | Status: AC | PRN
  Administered 2023-04-06: 3 mL

## 2023-04-06 NOTE — Progress Notes (Signed)
Office Visit Note   Patient: Vanessa [name]           Date of Birth: October 30, 1960           MRN: [id number] Visit Date: 04/06/2023              Requested by: Sheliah Hatch, MD [address] [place],  [place] [address] PCP: Sheliah Hatch, MD   Assessment & Plan: Visit Diagnoses:  1. Chronic left shoulder pain     Plan: Vanessa [name] is a 62 year old female with chronic left shoulder pain.  Impression is rotator cuff tendinosis possible bursitis as well.  Subacromial injection performed today.  Home exercises provided.  Relative rest and activity modification as needed.  Follow-up in 6 weeks if symptoms do not improve.  Follow-Up Instructions: No follow-ups on file.   Orders:  Orders Placed This Encounter  Procedures   Large Joint Inj   XR Shoulder Left   No orders of the defined types were placed in this encounter.     Procedures: Large Joint Inj: L subacromial bursa on 04/06/2023 11:13 AM Indications: pain Details: 22 G needle  Arthrogram: No  Medications: 3 mL lidocaine 1 %; 3 mL bupivacaine 0.5 %; 40 mg methylPREDNISolone acetate 40 MG/ML Outcome: tolerated well, no immediate complications Patient was prepped and draped in the usual sterile fashion.       Clinical Data: No additional findings.   Subjective: Chief Complaint  Patient presents with   Left Shoulder - Pain    HPI Vanessa [name] is a 62 year old female here for evaluation of left shoulder pain for 6 months.  Denies any injuries.  Left-hand-dominant.  Works as a Insurance account manager.  Feels pain around the lateral deltoid throughout the shoulder.  Review of Systems  Constitutional: Negative.   HENT: Negative.    Eyes: Negative.   Respiratory: Negative.    Cardiovascular: Negative.   Endocrine: Negative.   Musculoskeletal: Negative.   Neurological: Negative.   Hematological: Negative.   Psychiatric/Behavioral: Negative.    All other systems reviewed and are negative.    Objective: Vital  Signs: There were no vitals taken for this visit.  Physical Exam Vitals and nursing note reviewed.  Constitutional:      Appearance: She is well-developed.  HENT:     Head: Normocephalic and atraumatic.  Pulmonary:     Effort: Pulmonary effort is normal.  Abdominal:     Palpations: Abdomen is soft.  Musculoskeletal:     Cervical back: Neck supple.  Skin:    General: Skin is warm.     Capillary Refill: Capillary refill takes less than 2 seconds.  Neurological:     Mental Status: She is alert and oriented to person, place, and time.  Psychiatric:        Behavior: Behavior normal.        Thought Content: Thought content normal.        Judgment: Judgment normal.     Ortho Exam Exam of the left shoulder shows a normal range of motion with pain towards the extremes.  Her strength is preserved to manual muscle testing.  Pain with impingement test. Specialty Comments:  No specialty comments available.  Imaging: XR Shoulder Left  Result Date: 04/06/2023 X-rays of the left shoulder show degenerative changes of the [facility] joint.  No acute or structural abnormalities.    PMFS History: Patient Active Problem List   Diagnosis Date Noted   Primary osteoarthritis of right knee 03/15/2020  Status post total knee replacement, right 03/15/2020   Incontinence of feces    Morbid obesity (HCC) 07/12/2018   Allergic rhinitis 07/22/2017   Tear of medial meniscus of right knee, current 03/03/2017   Tear of lateral meniscus of right knee, current 03/03/2017   Vitamin D deficiency 12/04/2016   Baker's cyst of knee, right 05/21/2016   Edema 12/03/2015   Liver fibrosis 07/25/2015   S/P laparoscopic cholecystectomy 11/25/2014   Knee pain, right 07/17/2014   Physical exam 07/17/2014   Pap smear for cervical cancer screening 07/17/2014   Hyperlipidemia 01/12/2014   History of hepatitis C 04/27/2013   Substance abuse in remission (HCC) 04/27/2013   Hypertension    Depression    Past Medical  History:  Diagnosis Date   Anxiety    Arthritis    Chronic knee pain    Depression    Diverticulosis    Gall stone    Hepatitis C    Hx of Hepatitis C   Hypertension    Syphilis     Family History  Problem Relation Age of Onset   Hypertension Mother    Diabetes Mother    Drug abuse Brother    Esophageal cancer Brother 41   Colon cancer Brother 43   Hypertension Sister        x's 2   Diabetes Sister        oldest sister   Stomach cancer Neg Hx    Breast cancer Neg Hx    Rectal cancer Neg Hx     Past Surgical History:  Procedure Laterality Date   ANAL RECTAL MANOMETRY N/A 02/20/2019   Procedure: ANO RECTAL MANOMETRY;  Surgeon: Rachael Fee, MD;  Location: [facility];  Service: Endoscopy;  Laterality: N/A;   CARPAL TUNNEL RELEASE Left    CHOLECYSTECTOMY N/A 11/24/2014   Procedure: LAPAROSCOPIC CHOLECYSTECTOMY WITH INTRAOPERATIVE CHOLANGIOGRAM;  Surgeon: Gaynelle Adu, MD;  Location: [facility];  Service: General;  Laterality: N/A;   ECTOPIC PREGNANCY SURGERY     TOTAL KNEE ARTHROPLASTY Right 03/15/2020   Procedure: TOTAL KNEE ARTHROPLASTY;  Surgeon: Jodi Geralds, MD;  Location: [facility];  Service: Orthopedics;  Laterality: Right;   Social History   Occupational History   Occupation: Dialysis Tech  Tobacco Use   Smoking status: Every Day    Current packs/day: 0.00    Average packs/day: 1 pack/day for 35.0 years (35.0 ttl pk-yrs)    Types: E-cigarettes, Cigarettes    Start date: 05/25/1994    Last attempt to quit: 03/03/2016    Years since quitting: 7.0   Smokeless tobacco: Never  Vaping Use   Vaping status: Every Day   Substances: Nicotine, Flavoring  Substance and Sexual Activity   Alcohol use: No    Alcohol/week: 0.0 standard drinks of alcohol    Comment: "last alcohol was in the 1990's"   Drug use: Not Currently    Types: Marijuana, "Crack" cocaine    Comment: "last drug use was in the 1990's; never used heroin"   Sexual activity: Not Currently    Birth  control/protection: Post-menopausal

## 2023-04-08 ENCOUNTER — Other Ambulatory Visit: Payer: Self-pay | Admitting: Family Medicine

## 2023-04-08 DIAGNOSIS — F331 Major depressive disorder, recurrent, moderate: Secondary | ICD-10-CM

## 2023-05-31 ENCOUNTER — Encounter (HOSPITAL_BASED_OUTPATIENT_CLINIC_OR_DEPARTMENT_OTHER): Payer: Self-pay | Admitting: Student

## 2023-05-31 ENCOUNTER — Other Ambulatory Visit (HOSPITAL_BASED_OUTPATIENT_CLINIC_OR_DEPARTMENT_OTHER): Payer: Self-pay

## 2023-05-31 ENCOUNTER — Ambulatory Visit (HOSPITAL_BASED_OUTPATIENT_CLINIC_OR_DEPARTMENT_OTHER): Payer: 59 | Admitting: Student

## 2023-05-31 ENCOUNTER — Other Ambulatory Visit: Payer: Self-pay | Admitting: Family Medicine

## 2023-05-31 DIAGNOSIS — M25512 Pain in left shoulder: Secondary | ICD-10-CM | POA: Diagnosis not present

## 2023-05-31 DIAGNOSIS — G8929 Other chronic pain: Secondary | ICD-10-CM | POA: Diagnosis not present

## 2023-05-31 DIAGNOSIS — F331 Major depressive disorder, recurrent, moderate: Secondary | ICD-10-CM

## 2023-05-31 MED ORDER — METHYLPREDNISOLONE 4 MG PO TBPK
ORAL_TABLET | ORAL | 0 refills | Status: DC
  Filled 2023-05-31: qty 21, 6d supply, fill #0

## 2023-05-31 NOTE — Progress Notes (Signed)
 Chief Complaint: Left shoulder pain     History of Present Illness:    Vanessa [name] is a 63 y.o. female here today for evaluation of left shoulder pain.  She was seen by Dr. Jerri on 04/06/2023 and received a subacromial cortisone injection.  States that this did get her some good relief however pain returned acutely early this morning.  Rates pain today at a 9/10 without use of pain medication.  Pain is located in the anterior shoulder and lateral deltoid and does not radiate.  Symptoms have been ongoing for approximately 8 months.  She works as a insurance account manager.  She is left-hand dominant.   Surgical History:   None  PMH/PSH/Family History/Social History/Meds/Allergies:    Past Medical History:  Diagnosis Date   Anxiety    Arthritis    Chronic knee pain    Depression    Diverticulosis    Gall stone    Hepatitis C    Hx of Hepatitis C   Hypertension    Syphilis    Past Surgical History:  Procedure Laterality Date   ANAL RECTAL MANOMETRY N/A 02/20/2019   Procedure: ANO RECTAL MANOMETRY;  Surgeon: Teressa Toribio SQUIBB, MD;  Location: [facility];  Service: Endoscopy;  Laterality: N/A;   CARPAL TUNNEL RELEASE Left    CHOLECYSTECTOMY N/A 11/24/2014   Procedure: LAPAROSCOPIC CHOLECYSTECTOMY WITH INTRAOPERATIVE CHOLANGIOGRAM;  Surgeon: Camellia Blush, MD;  Location: [facility];  Service: General;  Laterality: N/A;   ECTOPIC PREGNANCY SURGERY     TOTAL KNEE ARTHROPLASTY Right 03/15/2020   Procedure: TOTAL KNEE ARTHROPLASTY;  Surgeon: Yvone Rush, MD;  Location: [facility];  Service: Orthopedics;  Laterality: Right;   Social History   Socioeconomic History   Marital status: Single    Spouse name: Not on file   Number of children: 1   Years of education: Not on file   Highest education level: Not on file  Occupational History   Occupation: Dialysis Tech  Tobacco Use   Smoking status: Every Day    Current packs/day: 0.00    Average packs/day: 1 pack/day  for 35.0 years (35.0 ttl pk-yrs)    Types: E-cigarettes, Cigarettes    Start date: 05/25/1994    Last attempt to quit: 03/03/2016    Years since quitting: 7.2   Smokeless tobacco: Never  Vaping Use   Vaping status: Every Day   Substances: Nicotine, Flavoring  Substance and Sexual Activity   Alcohol use: No    Alcohol/week: 0.0 standard drinks of alcohol    Comment: last alcohol was in the 1990's   Drug use: Not Currently    Types: Marijuana, Crack cocaine    Comment: last drug use was in the 1990's; never used heroin   Sexual activity: Not Currently    Birth control/protection: Post-menopausal  Other Topics Concern   Not on file  Social History Narrative   Works at [employer] in [place]. Lives with daughter.   Social Drivers of Corporate Investment Banker Strain: Not on file  Food Insecurity: Not on file  Transportation Needs: Not on file  Physical Activity: Not on file  Stress: Not on file  Social Connections: Not on file   Family History  Problem Relation Age of Onset   Hypertension Mother    Diabetes Mother    Drug  abuse Brother    Esophageal cancer Brother 22   Colon cancer Brother 73   Hypertension Sister        x's 2   Diabetes Sister        oldest sister   Stomach cancer Neg Hx    Breast cancer Neg Hx    Rectal cancer Neg Hx    No Known Allergies Current Outpatient Medications  Medication Sig Dispense Refill   methylPREDNISolone  (MEDROL  DOSEPAK) 4 MG TBPK tablet Take per packet instructions 21 each 0   albuterol  (VENTOLIN  HFA) 108 (90 Base) MCG/ACT inhaler Inhale 2 puffs into the lungs every 4 (four) hours as needed.     ALPRAZolam  (XANAX ) 0.5 MG tablet TAKE 1 TABLET BY MOUTH TWICE A DAY AS NEEDED FOR ANXIETY 60 tablet 0   ascorbic acid (VITAMIN C) 1000 MG tablet Take 1,000 mg by mouth daily.      baclofen  (LIORESAL ) 10 MG tablet Take 0.5-1 tablets (5-10 mg total) by mouth at bedtime as needed for muscle spasms. 30 each 3    benazepril -hydrochlorthiazide (LOTENSIN  HCT) 10-12.5 MG tablet TAKE 1 TABLET BY MOUTH EVERY DAY 90 tablet 4   cetirizine  (ZYRTEC ) 10 MG tablet Take 1 tablet (10 mg total) by mouth daily. 30 tablet 11   fluticasone  (FLONASE ) 50 MCG/ACT nasal spray SPRAY 2 SPRAYS INTO EACH NOSTRIL EVERY DAY 48 mL 1   ibuprofen  (ADVIL ) 800 MG tablet TAKE 1 TABLET BY MOUTH THREE TIMES A DAY AS NEEDED 90 tablet 0   Multiple Vitamin (MULTIVITAMIN WITH MINERALS) TABS tablet Take 1 tablet by mouth daily.     predniSONE  (DELTASONE ) 10 MG tablet 3 tabs x3 days and then 2 tabs x3 days and then 1 tab x3 days.  Take w/ food. 18 tablet 0   SUBOXONE  8-2 MG FILM Place 1 Film under the tongue 2 (two) times daily.      venlafaxine  XR (EFFEXOR -XR) 150 MG 24 hr capsule TAKE 1 CAPSULE BY MOUTH DAILY WITH BREAKFAST. 90 capsule 1   venlafaxine  XR (EFFEXOR -XR) 75 MG 24 hr capsule TAKE 1 CAPSULE BY MOUTH DAILY WITH BREAKFAST. 90 capsule 1   Vitamin D , Ergocalciferol , (DRISDOL ) 1.25 MG (50000 UNIT) CAPS capsule Take 1 capsule (50,000 Units total) by mouth every 7 (seven) days. 12 capsule 0   No current facility-administered medications for this visit.   No results found.  Review of Systems:   A ROS was performed including pertinent positives and negatives as documented in the HPI.  Physical Exam :   Constitutional: NAD and appears stated age Neurological: Alert and oriented Psych: Appropriate affect and cooperative There were no vitals taken for this visit.   Comprehensive Musculoskeletal Exam:    Left shoulder exam demonstrates tenderness over the anterior glenohumeral joint and lateral deltoid.  Active forward flexion to 70 degrees with passive flexion to 140 degrees.  20 degrees external rotation and internal rotation to back pocket.  Positive Hawkins test.  Imaging:   Xray review from 04/06/23 (left shoulder 3 views): Mild glenohumeral and AC joint osteoarthritis   I personally reviewed and interpreted the  radiographs.   Assessment:   63 y.o. female with chronic left shoulder pain.  This does appear consistent with tendinopathy of the rotator cuff however given her significant pain and deficits with flexion and external rotation I would like to obtain an MRI to further evaluate, particularly for the presence of a tear.  Given that last injection was just under 2 months ago I discussed that  while I cannot repeat this today, I would be willing to start her on a steroid pack with aims to get her symptomatic relief.  Patient agrees to this plan and I would like to have her follow-up with Dr. Jerri to review MRI and discuss treatment plan.  Plan :    - Start Medrol  Dosepak for symptomatic relief - Obtain left shoulder MRI and follow up with Dr. Jerri for further management     I personally saw and evaluated the patient, and participated in the management and treatment plan.  Leonce Reveal, PA-C Orthopedics

## 2023-06-01 ENCOUNTER — Telehealth: Payer: Self-pay

## 2023-06-01 NOTE — Telephone Encounter (Signed)
 Noted.  Agree w/ Ortho evaluation/follow up

## 2023-06-01 NOTE — Telephone Encounter (Signed)
 Patient called stating that she is still having a lot of shoulder pain and was wanting to know if Dr. Mahlon could give her a shot. I let her know that Dr. Mahlon does not do joint injections and that she should call the ortho office that she seen recently to let them know her shoulder is still giving her a lot of issues but I would let Dr. Mahlon know so that she can stay in the loop.

## 2023-06-01 NOTE — Telephone Encounter (Signed)
 Requested Prescriptions   Pending Prescriptions Disp Refills   venlafaxine  XR (EFFEXOR -XR) 75 MG 24 hr capsule [Pharmacy Med Name: VENLAFAXINE  HCL ER 75 MG CAP] 90 capsule 1    Sig: TAKE 1 CAPSULE BY MOUTH DAILY WITH BREAKFAST.   benazepril -hydrochlorthiazide (LOTENSIN  HCT) 10-12.5 MG tablet [Pharmacy Med Name: BENAZEPRIL -HCTZ 10-12.5 MG TAB] 90 tablet 1    Sig: TAKE 1 TABLET BY MOUTH EVERY DAY   ALPRAZolam  (XANAX ) 0.5 MG tablet [Pharmacy Med Name: ALPRAZOLAM  0.5 MG TABLET] 60 tablet 0    Sig: TAKE 1 TABLET BY MOUTH TWICE A DAY AS NEEDED FOR ANXIETY     Date of patient request: 06/01/2023 Last office visit: 03/04/2023 Upcoming visit: 06/09/2023 Date of last refill: 03/26/2023 Last refill amount: 60

## 2023-06-01 NOTE — Telephone Encounter (Signed)
 Patient called stating that the Prednisone that she was given on 05/31/2023 is not helping with her left shoulder pain.  Stated that she needs a cortisone shot.  Cb# [phone].  Please advise.  Thank you.

## 2023-06-03 ENCOUNTER — Ambulatory Visit: Payer: 59 | Admitting: Orthopaedic Surgery

## 2023-06-03 DIAGNOSIS — M25512 Pain in left shoulder: Secondary | ICD-10-CM

## 2023-06-03 DIAGNOSIS — G8929 Other chronic pain: Secondary | ICD-10-CM | POA: Diagnosis not present

## 2023-06-03 NOTE — Progress Notes (Signed)
 Office Visit Note   Patient: Vanessa [name]           Date of Birth: 10/17/1960           MRN: [id number] Visit Date: 06/03/2023              Requested by: Mahlon Comer BRAVO, MD [address],  [place] [address] PCP: Mahlon Comer BRAVO, MD   Assessment & Plan: Visit Diagnoses:  1. Chronic left shoulder pain     Plan: Vanessa [name] is a 63 year old female with a recurrent left shoulder pain.  She did get relief from subacromial injection but the relief has worn off.  Prednisone  Dosepak has not helped.  Subacromial injection repeated today.  Out of work tomorrow.  MRI of the left shoulder is pending.  Follow-Up Instructions: No follow-ups on file.   Orders:  No orders of the defined types were placed in this encounter.  No orders of the defined types were placed in this encounter.     Procedures: No procedures performed   Clinical Data: No additional findings.   Subjective: Chief Complaint  Patient presents with   Left Shoulder - Pain    HPI Vanessa [name] returns today for follow-up evaluation of left shoulder pain.  I saw her back in November and did a subacromial injection which helped a lot but the relief was temporary.  Saw Jackson Overturf recently and was given a steroid pack which did not help.  She is a teacher, early years/pre.  She has been unable to work due to the pain. Review of Systems  Constitutional: Negative.   HENT: Negative.    Eyes: Negative.   Respiratory: Negative.    Cardiovascular: Negative.   Endocrine: Negative.   Musculoskeletal: Negative.   Neurological: Negative.   Hematological: Negative.   Psychiatric/Behavioral: Negative.    All other systems reviewed and are negative.    Objective: Vital Signs: There were no vitals taken for this visit.  Physical Exam Vitals and nursing note reviewed.  Constitutional:      Appearance: She is well-developed.  HENT:     Head: Normocephalic and atraumatic.  Pulmonary:     Effort: Pulmonary  effort is normal.  Abdominal:     Palpations: Abdomen is soft.  Musculoskeletal:     Cervical back: Neck supple.  Skin:    General: Skin is warm.     Capillary Refill: Capillary refill takes less than 2 seconds.  Neurological:     Mental Status: She is alert and oriented to person, place, and time.  Psychiatric:        Behavior: Behavior normal.        Thought Content: Thought content normal.        Judgment: Judgment normal.     Ortho Exam Exam of the left shoulder shows pain and guarding with range of motion.  No focal motor deficits to manual muscle testing. Specialty Comments:  No specialty comments available.  Imaging: No results found.   PMFS History: Patient Active Problem List   Diagnosis Date Noted   Primary osteoarthritis of right knee 03/15/2020   Status post total knee replacement, right 03/15/2020   Incontinence of feces    Morbid obesity (HCC) 07/12/2018   Allergic rhinitis 07/22/2017   Tear of medial meniscus of right knee, current 03/03/2017   Tear of lateral meniscus of right knee, current 03/03/2017   Vitamin D  deficiency 12/04/2016   Baker's cyst of knee, right 05/21/2016   Edema 12/03/2015  Liver fibrosis 07/25/2015   S/P laparoscopic cholecystectomy 11/25/2014   Knee pain, right 07/17/2014   Physical exam 07/17/2014   Pap smear for cervical cancer screening 07/17/2014   Hyperlipidemia 01/12/2014   History of hepatitis C 04/27/2013   Substance abuse in remission (HCC) 04/27/2013   Hypertension    Depression    Past Medical History:  Diagnosis Date   Anxiety    Arthritis    Chronic knee pain    Depression    Diverticulosis    Gall stone    Hepatitis C    Hx of Hepatitis C   Hypertension    Syphilis     Family History  Problem Relation Age of Onset   Hypertension Mother    Diabetes Mother    Drug abuse Brother    Esophageal cancer Brother 85   Colon cancer Brother 54   Hypertension Sister        x's 2   Diabetes Sister         oldest sister   Stomach cancer Neg Hx    Breast cancer Neg Hx    Rectal cancer Neg Hx     Past Surgical History:  Procedure Laterality Date   ANAL RECTAL MANOMETRY N/A 02/20/2019   Procedure: ANO RECTAL MANOMETRY;  Surgeon: Teressa Toribio SQUIBB, MD;  Location: [facility];  Service: Endoscopy;  Laterality: N/A;   CARPAL TUNNEL RELEASE Left    CHOLECYSTECTOMY N/A 11/24/2014   Procedure: LAPAROSCOPIC CHOLECYSTECTOMY WITH INTRAOPERATIVE CHOLANGIOGRAM;  Surgeon: Camellia Blush, MD;  Location: [facility];  Service: General;  Laterality: N/A;   ECTOPIC PREGNANCY SURGERY     TOTAL KNEE ARTHROPLASTY Right 03/15/2020   Procedure: TOTAL KNEE ARTHROPLASTY;  Surgeon: Yvone Rush, MD;  Location: [facility];  Service: Orthopedics;  Laterality: Right;   Social History   Occupational History   Occupation: Dialysis Tech  Tobacco Use   Smoking status: Every Day    Current packs/day: 0.00    Average packs/day: 1 pack/day for 35.0 years (35.0 ttl pk-yrs)    Types: E-cigarettes, Cigarettes    Start date: 05/25/1994    Last attempt to quit: 03/03/2016    Years since quitting: 7.2   Smokeless tobacco: Never  Vaping Use   Vaping status: Every Day   Substances: Nicotine, Flavoring  Substance and Sexual Activity   Alcohol use: No    Alcohol/week: 0.0 standard drinks of alcohol    Comment: last alcohol was in the 1990's   Drug use: Not Currently    Types: Marijuana, Crack cocaine    Comment: last drug use was in the 1990's; never used heroin   Sexual activity: Not Currently    Birth control/protection: Post-menopausal

## 2023-06-07 ENCOUNTER — Other Ambulatory Visit: Payer: 59

## 2023-06-08 ENCOUNTER — Telehealth: Payer: Self-pay

## 2023-06-08 NOTE — Telephone Encounter (Signed)
 Copied from CRM [id number]. Topic: General - Other >> Jun 08, 2023  8:12 AM [name] wrote: Reason for CRM: Patient called in stating she was suppose to have an appointment with Dr.Farooque Fernand Frank and nobody was there and now nobody answering the phone Patient is asking if Dr.Tabori could try and give them a call because she need her medication prescribed by him

## 2023-06-08 NOTE — Telephone Encounter (Signed)
 Called Dr Irl office and there was no answer, LM with information about Ms Milosevic request and asking they call her back.   Called Ms Sherpa and informed her of this, we discussed she has contacted the pharmacy as Dr Irl office recording had stated, I told her I would call again a little later today.  Pt was hoping we could contact him at the hospital while he is working anesthesiology and I informed her that was not an option as we cannot contact him at the hospital while he is performing another job

## 2023-06-09 ENCOUNTER — Ambulatory Visit: Payer: 59 | Admitting: Family Medicine

## 2023-06-09 ENCOUNTER — Telehealth: Payer: Self-pay

## 2023-06-09 NOTE — Telephone Encounter (Signed)
 Copied from CRM [id number]. Topic: General - Inquiry >> Jun 09, 2023  8:30 AM Cathryn Cobb wrote: Reason for CRM: Pt went to specialist appt and the doctor wasn't there trying to see if she can get another referral

## 2023-06-09 NOTE — Telephone Encounter (Signed)
 Called , no answer unable to LM (VM not set up) I am not sure I understand what this message means, a new referral could take months to get in with someone should try and schedule with office already been referred to

## 2023-06-10 ENCOUNTER — Ambulatory Visit
Admission: RE | Admit: 2023-06-10 | Discharge: 2023-06-10 | Disposition: A | Discharge status: Home / Self Care | Payer: 59 | Source: Ambulatory Visit | Attending: Student | Admitting: Student

## 2023-06-10 DIAGNOSIS — G8929 Other chronic pain: Secondary | ICD-10-CM

## 2023-06-10 NOTE — Progress Notes (Signed)
Thanks I'll have her come back to see me.

## 2023-06-10 NOTE — Telephone Encounter (Signed)
Pt got in touch with Dr Welton Flakes, no concerns now with request for medication

## 2023-06-10 NOTE — Telephone Encounter (Signed)
Pt did get in touch with specialist office and notes there was a mix up with dates.  Pt did get her refill

## 2023-06-11 ENCOUNTER — Telehealth: Payer: Self-pay

## 2023-06-11 NOTE — Telephone Encounter (Signed)
Tried to call patient. No answer. No voicemail set up. Patient needs a follow up with Dr.Xu to discuss MRI results.

## 2023-06-14 ENCOUNTER — Telehealth: Payer: Self-pay | Admitting: Orthopaedic Surgery

## 2023-06-14 ENCOUNTER — Emergency Department (HOSPITAL_COMMUNITY)
Admission: EM | Admit: 2023-06-14 | Discharge: 2023-06-14 | Disposition: A | Discharge status: Home / Self Care | Payer: 59 | Attending: Emergency Medicine | Admitting: Emergency Medicine

## 2023-06-14 ENCOUNTER — Encounter (HOSPITAL_COMMUNITY): Payer: Self-pay | Admitting: Emergency Medicine

## 2023-06-14 ENCOUNTER — Other Ambulatory Visit: Payer: Self-pay

## 2023-06-14 DIAGNOSIS — Z79899 Other long term (current) drug therapy: Secondary | ICD-10-CM | POA: Diagnosis not present

## 2023-06-14 DIAGNOSIS — I10 Essential (primary) hypertension: Secondary | ICD-10-CM | POA: Diagnosis not present

## 2023-06-14 DIAGNOSIS — M25512 Pain in left shoulder: Secondary | ICD-10-CM | POA: Diagnosis present

## 2023-06-14 DIAGNOSIS — G8929 Other chronic pain: Secondary | ICD-10-CM | POA: Insufficient documentation

## 2023-06-14 MED ORDER — KETOROLAC TROMETHAMINE 30 MG/ML IJ SOLN
30.0000 mg | Freq: Once | INTRAMUSCULAR | Status: AC
  Administered 2023-06-14: 30 mg via INTRAMUSCULAR
  Filled 2023-06-14: qty 1

## 2023-06-14 MED ORDER — LIDOCAINE 5 % EX PTCH
2.0000 | MEDICATED_PATCH | CUTANEOUS | Status: DC
  Administered 2023-06-14: 2 via TRANSDERMAL
  Filled 2023-06-14: qty 2

## 2023-06-14 MED ORDER — LIDOCAINE 5 % EX PTCH: 1.0000 | MEDICATED_PATCH | CUTANEOUS | 0 refills | Status: DC

## 2023-06-14 NOTE — Telephone Encounter (Signed)
Tried to call. No answer. No voicemail set up.

## 2023-06-14 NOTE — ED Provider Notes (Signed)
EMERGENCY DEPARTMENT AT [facility] Provider Note   CSN: [id number] Arrival date & time: 06/14/23  0532     History  Chief Complaint  Patient presents with   Shoulder Pain    Vanessa [name] is a 63 y.o. female.  The history is provided by the patient.  Shoulder Pain Location:  Shoulder Shoulder location:  L shoulder Pain details:    Severity:  Severe   Duration:  12 months   Timing:  Constant Foreign body present:  No foreign bodies Prior injury to area: dislocation 20 years ago. Relieved by:  Nothing Worsened by:  Nothing Ineffective treatments: shoulder injection by Dr. Roda Shutters November and repeated in January patient reports this did not help. Associated symptoms: no fever and no swelling   Risk factors: no concern for non-accidental trauma   Patient with a h/o HTN and left shoulder pain seen by Dr. Roda Shutters with MRI 06/10/23 presents with ongoing left shoulder pain.      Past Medical History:  Diagnosis Date   Anxiety    Arthritis    Chronic knee pain    Depression    Diverticulosis    Gall stone    Hepatitis C    Hx of Hepatitis C   Hypertension    Syphilis      Home Medications Prior to Admission medications   Medication Sig Start Date End Date Taking? Authorizing Provider  lidocaine (LIDODERM) 5 % Place 1 patch onto the skin daily. Remove & Discard patch within 12 hours or as directed by MD 06/14/23  Yes Tye Vigo, MD  albuterol (VENTOLIN HFA) 108 (90 Base) MCG/ACT inhaler Inhale 2 puffs into the lungs every 4 (four) hours as needed. 05/08/22   [provider]  ALPRAZolam Prudy Feeler) 0.5 MG tablet TAKE 1 TABLET BY MOUTH TWICE A DAY AS NEEDED FOR ANXIETY 06/02/23   Sheliah Hatch, MD  ascorbic acid (VITAMIN C) 1000 MG tablet Take 1,000 mg by mouth daily.  01/30/16   [provider]  baclofen (LIORESAL) 10 MG tablet Take 0.5-1 tablets (5-10 mg total) by mouth at bedtime as needed for muscle spasms. 12/04/20   Hilts, Casimiro Needle, MD   benazepril-hydrochlorthiazide (LOTENSIN HCT) 10-12.5 MG tablet TAKE 1 TABLET BY MOUTH EVERY DAY 06/02/23   Sheliah Hatch, MD  cetirizine (ZYRTEC) 10 MG tablet Take 1 tablet (10 mg total) by mouth daily. 12/30/21   Sheliah Hatch, MD  fluticasone [facility]) 50 MCG/ACT nasal spray SPRAY 2 SPRAYS INTO EACH NOSTRIL EVERY DAY 03/30/22   Sheliah Hatch, MD  ibuprofen (ADVIL) 800 MG tablet TAKE 1 TABLET BY MOUTH THREE TIMES A DAY AS NEEDED 02/08/23   Sheliah Hatch, MD  methylPREDNISolone (MEDROL DOSEPAK) 4 MG TBPK tablet Take per packet instructions 05/31/23   Amador Cunas, PA-C  Multiple Vitamin (MULTIVITAMIN WITH MINERALS) TABS tablet Take 1 tablet by mouth daily.    [provider]  predniSONE (DELTASONE) 10 MG tablet 3 tabs x3 days and then 2 tabs x3 days and then 1 tab x3 days.  Take w/ food. 03/04/23   Sheliah Hatch, MD  SUBOXONE 8-2 MG FILM Place 1 Film under the tongue 2 (two) times daily.  04/18/13   [provider]  venlafaxine XR (EFFEXOR-XR) 150 MG 24 hr capsule TAKE 1 CAPSULE BY MOUTH DAILY WITH BREAKFAST. 02/08/23   Sheliah Hatch, MD  venlafaxine XR (EFFEXOR-XR) 75 MG 24 hr capsule TAKE 1 CAPSULE BY MOUTH DAILY WITH BREAKFAST. 06/02/23  Sheliah Hatch, MD  Vitamin D, Ergocalciferol, (DRISDOL) 1.25 MG (50000 UNIT) CAPS capsule Take 1 capsule (50,000 Units total) by mouth every 7 (seven) days. 12/08/22   Sheliah Hatch, MD      Allergies    Patient has no known allergies.    Review of Systems   Review of Systems  Constitutional:  Negative for fever.  Eyes:  Negative for redness.  Respiratory:  Negative for wheezing and stridor.   Musculoskeletal:  Positive for arthralgias.  All other systems reviewed and are negative.   Physical Exam Updated Vital Signs BP (!) 146/98 (BP Location: Right Arm)   Pulse 81   Temp 97.8 F (36.6 C) (Oral)   Resp 16   SpO2 99%  Physical Exam Vitals and nursing note reviewed. Exam conducted  with a chaperone present.  Constitutional:      General: She is not in acute distress.    Appearance: She is well-developed.  HENT:     Head: Normocephalic and atraumatic.     Nose: Nose normal.  Eyes:     Pupils: Pupils are equal, round, and reactive to light.  Cardiovascular:     Rate and Rhythm: Normal rate and regular rhythm.     Pulses: Normal pulses.     Heart sounds: Normal heart sounds.  Pulmonary:     Effort: Pulmonary effort is normal. No respiratory distress.     Breath sounds: Normal breath sounds.  Abdominal:     General: Bowel sounds are normal. There is no distension.     Palpations: Abdomen is soft.     Tenderness: There is no abdominal tenderness. There is no guarding or rebound.  Musculoskeletal:        General: Normal range of motion.     Left shoulder: No swelling, deformity, effusion, laceration or bony tenderness. Normal strength. Normal pulse.     Left upper arm: Normal.     Left elbow: Normal.     Cervical back: Normal range of motion and neck supple.  Skin:    General: Skin is warm and dry.     Capillary Refill: Capillary refill takes less than 2 seconds.     Findings: No erythema or rash.  Neurological:     General: No focal deficit present.     Deep Tendon Reflexes: Reflexes normal.  Psychiatric:        Mood and Affect: Mood normal.     ED Results / Procedures / Treatments   Labs (all labs ordered are listed, but only abnormal results are displayed) Labs Reviewed - No data to display  EKG None  Radiology No results found.  Procedures Procedures    Medications Ordered in ED Medications  lidocaine (LIDODERM) 5 % 2 patch (2 patches Transdermal Patch Applied 06/14/23 0549)  ketorolac (TORADOL) 30 MG/ML injection 30 mg (30 mg Intramuscular Given 06/14/23 0551)    ED Course/ Medical Decision Making/ A&P                                 Medical Decision Making Patient with left shoulder pain who sees Dr. Roda Shutters and had MRI within the past  week presents with ongoing pain   Amount and/or Complexity of Data Reviewed External Data Reviewed: radiology and notes.    Details: MRI reviewed outpatient notes reviewed   Risk Prescription drug management. Risk Details: No acute injury. Problem is chronic in nature.  Toradol and  Lidoderm provided in the ED for relief.  I have spoke to patient at length regarding close follow up with Dr. Roda Shutters to discuss MRI results and plan of care.  I have provided a work note for the patient for today and have advised calling Dr. Warren Danes office for close follow up.  I will add lidoderm to patient's home regimen.  I have explained that light duty restrictions for work will need to come from patient's PMD or Orthopedics specialist.  Patient is stable for discharge with close follow up.      Final Clinical Impression(s) / ED Diagnoses Final diagnoses:  Chronic left shoulder pain    I have reviewed the triage vital signs and the nursing notes. Pertinent labs & imaging results that were available during my care of the patient were reviewed by me and considered in my medical decision making (see chart for details). After history, exam, and medical workup I feel the patient has been appropriately medically screened and is safe for discharge home. Pertinent diagnoses were discussed with the patient. Patient was given return precautions.  Rx / DC Orders ED Discharge Orders          Ordered    lidocaine (LIDODERM) 5 %  Every 24 hours        06/14/23 0545              Kourtlyn Charlet, MD 06/14/23 [phone]

## 2023-06-14 NOTE — ED Notes (Addendum)
Pt inquired about receiving shoulder immobilizer and light duty restrictions with me, I informed EDP who spoke with pt in room. EDP discussed that shoulder immobilization would make her shoulder pain worse and not provide relief. EDP provided pt with opportunity to answer any questions pt had. Pt was recommended by EDP to follow up with her orthopedic provider. Pt acknowledged these instructions. I was in exam room with EDP during this discussion. Pt had no further questions or concerns for me or provider.

## 2023-06-14 NOTE — ED Provider Notes (Signed)
Patient has a history of polysubstance abuse and see pain management.  She wants a change in her regimen. I will not be adding narcotics or changing regimen as patient sees Dr. Welton Flakes in [facility] for her pain.  She repeatedly reports injections and home pain medication do not help.  She is on Belbuca film and xanax at home.  Patient reports she has an appointment on 1/29 with Dr. Roda Shutters, I do not see this in the computer but see a note  that the office has tried to reach out but no voicemail is set up to schedule the return appointment.  I have advised patient to call Dr. Warren Danes office today to schedule a follow up appointment  I suspect the patient is here as she is out of her Butrans as there are notes in the computer indicating she needed a refill and the office could not be reached. I will not be prescribing this medication.    I spoke to the patient at length with Paramedic Dylan present.  Nicanor Alcon, Finis Hendricksen, MD 06/14/23 1610

## 2023-06-14 NOTE — ED Triage Notes (Signed)
Patient arrived with complaints of left shoulder pain over the last year, states she had an MRI on Thursday for same but unsure of results. Declines any injury. Last dose of Tylenol an hour ago.

## 2023-06-14 NOTE — Telephone Encounter (Signed)
Pt called requesting a call back from Lauren G. Pt states she has some medical questions about her shoulder. Please call pt at [phone].

## 2023-06-25 ENCOUNTER — Encounter: Payer: Self-pay | Admitting: Orthopaedic Surgery

## 2023-06-25 ENCOUNTER — Other Ambulatory Visit: Payer: Self-pay

## 2023-06-25 ENCOUNTER — Ambulatory Visit (INDEPENDENT_AMBULATORY_CARE_PROVIDER_SITE_OTHER): Payer: 59 | Admitting: Orthopaedic Surgery

## 2023-06-25 ENCOUNTER — Encounter: Payer: Self-pay | Admitting: Sports Medicine

## 2023-06-25 ENCOUNTER — Ambulatory Visit (INDEPENDENT_AMBULATORY_CARE_PROVIDER_SITE_OTHER): Payer: 59 | Admitting: Sports Medicine

## 2023-06-25 DIAGNOSIS — G8929 Other chronic pain: Secondary | ICD-10-CM

## 2023-06-25 DIAGNOSIS — M19012 Primary osteoarthritis, left shoulder: Secondary | ICD-10-CM

## 2023-06-25 DIAGNOSIS — M25512 Pain in left shoulder: Secondary | ICD-10-CM

## 2023-06-25 MED ORDER — BUPIVACAINE HCL 0.25 % IJ SOLN
2.0000 mL | INTRAMUSCULAR | Status: AC | PRN
  Administered 2023-06-25: 2 mL via INTRA_ARTICULAR

## 2023-06-25 MED ORDER — METHYLPREDNISOLONE ACETATE 40 MG/ML IJ SUSP
40.0000 mg | INTRAMUSCULAR | Status: AC | PRN
  Administered 2023-06-25: 40 mg via INTRA_ARTICULAR

## 2023-06-25 MED ORDER — LIDOCAINE HCL 1 % IJ SOLN
2.0000 mL | INTRAMUSCULAR | Status: AC | PRN
  Administered 2023-06-25: 2 mL

## 2023-06-25 NOTE — Progress Notes (Signed)
   Procedure Note  Patient: Vanessa [name]             Date of Birth: May 30, 1960           MRN: [id number]             Visit Date: 06/25/2023  Procedures: Visit Diagnoses:  1. Chronic left shoulder pain   2. Primary osteoarthritis, left shoulder    Large Joint Inj: L glenohumeral on 06/25/2023 11:35 AM Indications: pain Details: 22 G 3.5 in needle, ultrasound-guided posterior approach Medications: 2 mL lidocaine 1 %; 2 mL bupivacaine 0.25 %; 40 mg methylPREDNISolone acetate 40 MG/ML Outcome: tolerated well, no immediate complications  US-guided glenohumeral joint injection, left shoulder After discussion on risks/benefits/indications, informed verbal consent was obtained. A timeout was then performed. The patient was positioned lying lateral recumbent on examination table. The patient's shoulder was prepped with betadine and multiple alcohol swabs and utilizing ultrasound guidance, the patient's glenohumeral joint was identified on ultrasound. Using ultrasound guidance a 22-gauge, 3.5 inch needle with a mixture of 2:2:1 cc's lidocaine:bupivicaine:depomedrol was directed from a lateral to medial direction via in-plane technique into the glenohumeral joint with visualization of appropriate spread of injectate into the joint. Patient tolerated the procedure well without immediate complications.      Procedure, treatment alternatives, risks and benefits explained, specific risks discussed. Consent was given by the patient. Immediately prior to procedure a time out was called to verify the correct patient, procedure, equipment, support staff and site/side marked as required. Patient was prepped and draped in the usual sterile fashion.     - follow-up with Dr. Roda Shutters as indicated in 6 weeks; I am happy to see them as needed  Madelyn Brunner, DO Primary Care Sports Medicine Physician  [facility] - Orthopedics  This note was dictated using Dragon naturally speaking software and may contain  errors in syntax, spelling, or content which have not been identified prior to signing this note.

## 2023-06-25 NOTE — Progress Notes (Signed)
Office Visit Note   Patient: Vanessa [name]           Date of Birth: July 02, 1960           MRN: [id number] Visit Date: 06/25/2023              Requested by: Sheliah Hatch, MD [address] [employer] [place],  [place] [address] PCP: Sheliah Hatch, MD   Assessment & Plan: Visit Diagnoses:  1. Chronic left shoulder pain     Plan: MRI of the left shoulder reviewed with the patient in detail.  She has a fair amount of degenerative findings of the rotator cuff and the glenohumeral joint.  She has a fair amount of synovitis as well.  I would like to try glenohumeral injection and placed her on work restrictions for 6 weeks.  Recheck in 6 weeks.  I will make a referral to physical therapy as well.  Follow-Up Instructions: Return in about 6 weeks (around 08/06/2023).   Orders:  Orders Placed This Encounter  Procedures   Ambulatory referral to Physical Therapy   No orders of the defined types were placed in this encounter.     Procedures: No procedures performed   Clinical Data: No additional findings.   Subjective: Chief Complaint  Patient presents with   Left Shoulder - Follow-up    MRI review    HPI [name] returns today to discuss left shoulder MRI scan.  Unfortunately the most recent subacromial injection was not very effective.  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 Review of Systems   Objective: Vital Signs: There were no vitals taken for this visit.  Physical Exam  Ortho Exam Examination of the left shoulder shows pain in mild to moderate restriction with forward flexion external rotation abduction compared to the right shoulder.  Pain with all glenohumeral motion. Specialty Comments:  No specialty comments available.  Imaging: No results found.   PMFS History: Patient Active Problem List   Diagnosis Date Noted   Primary osteoarthritis of right knee 03/15/2020   Status post total knee replacement, right 03/15/2020    Incontinence of feces    Morbid obesity (HCC) 07/12/2018   Allergic rhinitis 07/22/2017   Tear of medial meniscus of right knee, current 03/03/2017   Tear of lateral meniscus of right knee, current 03/03/2017   Vitamin D deficiency 12/04/2016   Baker's cyst of knee, right 05/21/2016   Edema 12/03/2015   Liver fibrosis 07/25/2015   S/P laparoscopic cholecystectomy 11/25/2014   Knee pain, right 07/17/2014   Physical exam 07/17/2014   Pap smear for cervical cancer screening 07/17/2014   Hyperlipidemia 01/12/2014   History of hepatitis C 04/27/2013   Substance abuse in remission (HCC) 04/27/2013   Hypertension    Depression    Past Medical History:  Diagnosis Date   Anxiety    Arthritis    Chronic knee pain    Depression    Diverticulosis    Gall stone    Hepatitis C    Hx of Hepatitis C   Hypertension    Syphilis     Family History  Problem Relation Age of Onset   Hypertension Mother    Diabetes Mother    Drug abuse Brother    Esophageal cancer Brother 83   Colon cancer Brother 59   Hypertension Sister        x's 2   Diabetes Sister        oldest  sister   Stomach cancer Neg Hx    Breast cancer Neg Hx    Rectal cancer Neg Hx     Past Surgical History:  Procedure Laterality Date   ANAL RECTAL MANOMETRY N/A 02/20/2019   Procedure: ANO RECTAL MANOMETRY;  Surgeon: Rachael Fee, MD;  Location: [facility];  Service: Endoscopy;  Laterality: N/A;   CARPAL TUNNEL RELEASE Left    CHOLECYSTECTOMY N/A 11/24/2014   Procedure: LAPAROSCOPIC CHOLECYSTECTOMY WITH INTRAOPERATIVE CHOLANGIOGRAM;  Surgeon: Gaynelle Adu, MD;  Location: [facility];  Service: General;  Laterality: N/A;   ECTOPIC PREGNANCY SURGERY     TOTAL KNEE ARTHROPLASTY Right 03/15/2020   Procedure: TOTAL KNEE ARTHROPLASTY;  Surgeon: Jodi Geralds, MD;  Location: [facility];  Service: Orthopedics;  Laterality: Right;   Social History   Occupational History   Occupation: Dialysis Tech  Tobacco Use   Smoking status:  Every Day    Current packs/day: 0.00    Average packs/day: 1 pack/day for 35.0 years (35.0 ttl pk-yrs)    Types: E-cigarettes, Cigarettes    Start date: 05/25/1994    Last attempt to quit: 03/03/2016    Years since quitting: 7.3   Smokeless tobacco: Never  Vaping Use   Vaping status: Every Day   Substances: Nicotine, Flavoring  Substance and Sexual Activity   Alcohol use: No    Alcohol/week: 0.0 standard drinks of alcohol    Comment: "last alcohol was in the 1990's"   Drug use: Not Currently    Types: Marijuana, "Crack" cocaine    Comment: "last drug use was in the 1990's; never used heroin"   Sexual activity: Not Currently    Birth control/protection: Post-menopausal

## 2023-07-02 ENCOUNTER — Telehealth: Payer: Self-pay | Admitting: Orthopaedic Surgery

## 2023-07-02 NOTE — Telephone Encounter (Signed)
[employer] forms received. To [employer]. 

## 2023-07-09 ENCOUNTER — Emergency Department (HOSPITAL_COMMUNITY): Payer: 59

## 2023-07-09 ENCOUNTER — Encounter (HOSPITAL_COMMUNITY): Payer: Self-pay

## 2023-07-09 ENCOUNTER — Emergency Department (HOSPITAL_COMMUNITY)
Admission: EM | Admit: 2023-07-09 | Discharge: 2023-07-09 | Disposition: A | Discharge status: Home / Self Care | Payer: 59

## 2023-07-09 ENCOUNTER — Other Ambulatory Visit: Payer: Self-pay

## 2023-07-09 ENCOUNTER — Ambulatory Visit: Payer: Self-pay | Admitting: Family Medicine

## 2023-07-09 DIAGNOSIS — R1012 Left upper quadrant pain: Secondary | ICD-10-CM | POA: Insufficient documentation

## 2023-07-09 DIAGNOSIS — R1010 Upper abdominal pain, unspecified: Secondary | ICD-10-CM | POA: Diagnosis present

## 2023-07-09 DIAGNOSIS — R1011 Right upper quadrant pain: Secondary | ICD-10-CM | POA: Insufficient documentation

## 2023-07-09 DIAGNOSIS — R1013 Epigastric pain: Secondary | ICD-10-CM | POA: Diagnosis not present

## 2023-07-09 DIAGNOSIS — R109 Unspecified abdominal pain: Secondary | ICD-10-CM

## 2023-07-09 LAB — CBC WITH DIFFERENTIAL/PLATELET
Abs Immature Granulocytes: 0.03 10*3/uL (ref 0.00–0.07)
Basophils Absolute: 0 10*3/uL (ref 0.0–0.1)
Basophils Relative: 0 %
Eosinophils Absolute: 0 10*3/uL (ref 0.0–0.5)
Eosinophils Relative: 0 %
HCT: 43 % (ref 36.0–46.0)
Hemoglobin: 14.1 g/dL (ref 12.0–15.0)
Immature Granulocytes: 0 %
Lymphocytes Relative: 15 %
Lymphs Abs: 1.4 10*3/uL (ref 0.7–4.0)
MCH: 29.1 pg (ref 26.0–34.0)
MCHC: 32.8 g/dL (ref 30.0–36.0)
MCV: 88.7 fL (ref 80.0–100.0)
Monocytes Absolute: 0.3 10*3/uL (ref 0.1–1.0)
Monocytes Relative: 4 %
Neutro Abs: 7.3 10*3/uL (ref 1.7–7.7)
Neutrophils Relative %: 81 %
Platelets: 291 10*3/uL (ref 150–400)
RBC: 4.85 MIL/uL (ref 3.87–5.11)
RDW: 14.6 % (ref 11.5–15.5)
WBC: 9.2 10*3/uL (ref 4.0–10.5)
nRBC: 0 % (ref 0.0–0.2)

## 2023-07-09 LAB — COMPREHENSIVE METABOLIC PANEL
ALT: 24 U/L (ref 0–44)
AST: 25 U/L (ref 15–41)
Albumin: 3.6 g/dL (ref 3.5–5.0)
Alkaline Phosphatase: 52 U/L (ref 38–126)
Anion gap: 13 (ref 5–15)
BUN: 13 mg/dL (ref 8–23)
CO2: 26 mmol/L (ref 22–32)
Calcium: 9.4 mg/dL (ref 8.9–10.3)
Chloride: 100 mmol/L (ref 98–111)
Creatinine, Ser: 0.67 mg/dL (ref 0.44–1.00)
GFR, Estimated: >60 mL/min (ref >60)
Glucose, Bld: 115 mg/dL — ABNORMAL HIGH (ref 70–99)
Potassium: 3.4 mmol/L — ABNORMAL LOW (ref 3.5–5.1)
Sodium: 139 mmol/L (ref 135–145)
Total Bilirubin: 0.6 mg/dL (ref 0.0–1.2)
Total Protein: 7.4 g/dL (ref 6.5–8.1)

## 2023-07-09 LAB — URINALYSIS, ROUTINE W REFLEX MICROSCOPIC
Bilirubin Urine: NEGATIVE
Glucose, UA: NEGATIVE mg/dL
Hgb urine dipstick: NEGATIVE
Ketones, ur: NEGATIVE mg/dL
Leukocytes,Ua: NEGATIVE
Nitrite: NEGATIVE
Protein, ur: NEGATIVE mg/dL
Specific Gravity, Urine: 1.016 (ref 1.005–1.030)
pH: 5 (ref 5.0–8.0)

## 2023-07-09 LAB — LIPASE, BLOOD: Lipase: 31 U/L (ref 11–51)

## 2023-07-09 MED ORDER — MORPHINE SULFATE (PF) 4 MG/ML IV SOLN
4.0000 mg | Freq: Once | INTRAVENOUS | Status: AC
  Administered 2023-07-09: 4 mg via INTRAVENOUS
  Filled 2023-07-09: qty 1

## 2023-07-09 MED ORDER — SODIUM CHLORIDE 0.9 % IV SOLN
2.0000 g | Freq: Once | INTRAVENOUS | Status: AC
  Administered 2023-07-09: 2 g via INTRAVENOUS
  Filled 2023-07-09: qty 20

## 2023-07-09 MED ORDER — IOHEXOL 350 MG/ML SOLN
75.0000 mL | Freq: Once | INTRAVENOUS | Status: AC | PRN
  Administered 2023-07-09: 75 mL via INTRAVENOUS

## 2023-07-09 MED ORDER — MORPHINE SULFATE (PF) 2 MG/ML IV SOLN: 2.0000 mg | Freq: Once | INTRAVENOUS | Status: DC

## 2023-07-09 MED ORDER — HYOSCYAMINE SULFATE 0.125 MG SL SUBL: 0.1250 mg | SUBLINGUAL_TABLET | SUBLINGUAL | 0 refills | Status: AC | PRN

## 2023-07-09 MED ORDER — KETOROLAC TROMETHAMINE 15 MG/ML IJ SOLN: 15.0000 mg | Freq: Once | INTRAMUSCULAR | Status: DC

## 2023-07-09 MED ORDER — HYOSCYAMINE SULFATE 0.125 MG SL SUBL
0.2500 mg | SUBLINGUAL_TABLET | Freq: Once | SUBLINGUAL | Status: AC
  Administered 2023-07-09: 0.25 mg via SUBLINGUAL
  Filled 2023-07-09: qty 2

## 2023-07-09 MED ORDER — SODIUM CHLORIDE 0.9 % IV BOLUS
1000.0000 mL | Freq: Once | INTRAVENOUS | Status: AC
  Administered 2023-07-09: 1000 mL via INTRAVENOUS

## 2023-07-09 MED ORDER — KETOROLAC TROMETHAMINE 10 MG PO TABS: 10.0000 mg | ORAL_TABLET | Freq: Four times a day (QID) | ORAL | 0 refills | Status: AC | PRN

## 2023-07-09 MED ORDER — ONDANSETRON HCL 4 MG/2ML IJ SOLN
4.0000 mg | Freq: Once | INTRAMUSCULAR | Status: AC
  Administered 2023-07-09: 4 mg via INTRAVENOUS
  Filled 2023-07-09: qty 2

## 2023-07-09 MED ORDER — METRONIDAZOLE 500 MG/100ML IV SOLN
500.0000 mg | Freq: Once | INTRAVENOUS | Status: AC
  Administered 2023-07-09: 500 mg via INTRAVENOUS
  Filled 2023-07-09: qty 100

## 2023-07-09 NOTE — Telephone Encounter (Signed)
Copied from CRM [id number]. Topic: Clinical - Red Word Triage >> Jul 09, 2023 12:01 PM Isabell A wrote: [facility] that prompted transfer to Nurse Triage: Patient experiencing severe upper abdominal pain.   Chief Complaint: Abdominal pain  Symptoms: Right upper abdominal pain Frequency: Constant  Pertinent Negatives: Patient denies chest pain, shortness of breath, current nausea  Disposition: [x] ED /[] Urgent Care (no appt availability in office) / [] Appointment(In office/virtual)/ []  [facility]/ [] Home Care/ [] Refused Recommended Disposition /[] [facility]/ []  Follow-up with PCP Additional Notes: Patient reports she began to experience sudden right upper quadrant abdominal pain 2 days ago. She states her pain is the same as she had before her gallbladder was removed. Patient states her pain is 10/10. Patient advised that due to her pain she should go to the ED for evaluation and treatment. Patient understood and is agreeable with this plan.     Reason for Disposition  [1] SEVERE pain (e.g., excruciating) AND [2] present > 1 hour  Answer Assessment - Initial Assessment Questions 1. LOCATION: "Where does it hurt?"      Right upper abdomen  2. RADIATION: "Does the pain shoot anywhere else?" (e.g., chest, back)     No  3. ONSET: "When did the pain begin?" (e.g., minutes, hours or days ago)      3 days ago  4. SUDDEN: "Gradual or sudden onset?"     Suddenly  5. PATTERN "Does the pain come and go, or is it constant?"    - If it comes and goes: "How long does it last?" "Do you have pain now?"     (Note: Comes and goes means the pain is intermittent. It goes away completely between bouts.)    - If constant: "Is it getting better, staying the same, or getting worse?"      (Note: Constant means the pain never goes away completely; most serious pain is constant and gets worse.)      Yes  6. SEVERITY: "How bad is the pain?"  (e.g., Scale 1-10; mild, moderate, or severe)    -  MILD (1-3): Doesn't interfere with normal activities, abdomen soft and not tender to touch..     - MODERATE (4-7): Interferes with normal activities or awakens from sleep, abdomen tender to touch.     - SEVERE (8-10): Excruciating pain, doubled over, unable to do any normal activities.       10/10 7. RECURRENT SYMPTOM: "Have you ever had this type of stomach pain before?" If Yes, ask: "When was the last time?" and "What happened that time?"      Yes, has had gallbladder removed  8. AGGRAVATING FACTORS: "Does anything seem to cause this pain?" (e.g., foods, stress, alcohol)     Unsure  9. CARDIAC SYMPTOMS: "Do you have any of the following symptoms: chest pain, difficulty breathing, sweating, nausea?"     No 10. OTHER SYMPTOMS: "Do you have any other symptoms?" (e.g., back pain, diarrhea, fever, urination pain, vomiting)       Nausea that is resolved  11. PREGNANCY: "Is there any chance you are pregnant?" "When was your last menstrual period?"       No  Protocols used: Abdominal Pain - Upper-A-AH

## 2023-07-09 NOTE — ED Notes (Signed)
Attempted to call patient  regarding IV but the phone number on the chart is disconnected.

## 2023-07-09 NOTE — ED Notes (Signed)
Patient left before discharge. To my knowledge, still has IV in arm.

## 2023-07-09 NOTE — Telephone Encounter (Signed)
FYI pt going to the ED.

## 2023-07-09 NOTE — ED Triage Notes (Signed)
Pt c/o upper abdominal pain, nausea, and constipation for past week.

## 2023-07-09 NOTE — ED Provider Triage Note (Signed)
Emergency Medicine Provider Triage Evaluation Note  Vanessa [name] , a 63 y.o. female  was evaluated in triage.  Pt complains of epigastric pain. Patient endorses epigastric pain with nausea x4 days.  Review of Systems  Positive: Epigastric pain, nausea Negative: Fevers, vomiting, constipation, diarrhea  Physical Exam  BP (!) 170/91 (BP Location: Right Arm)   Pulse (!) 125   Temp 99 F (37.2 C) (Oral)   Resp 16   Ht 5\' 1"  (1.549 m)   Wt 82.6 kg   SpO2 99%   BMI 34.39 kg/m  Gen:   Awake, no distress   Resp:  Normal effort  MSK:   Moves extremities without difficulty  Other:    Medical Decision Making  Medically screening exam initiated at 12:56 PM.  Appropriate orders placed.  Vanessa [name] was informed that the remainder of the evaluation will be completed by another provider, this initial triage assessment does not replace that evaluation, and the importance of remaining in the ED until their evaluation is complete.     Vanessa Marion, PA-C 07/09/23 1257

## 2023-07-09 NOTE — ED Provider Notes (Signed)
[place] EMERGENCY DEPARTMENT AT [facility] Provider Note   CSN: [id number] Arrival date & time: 07/09/23  1217     History  Chief Complaint  Patient presents with   Abdominal Pain    Vanessa [name] is a 63 y.o. female.  63 year old female with past medical history of substance abuse in the past as well as cholecystectomy in the past presenting to the emergency department today with upper abdominal pain.  The patient states this been going now for the past 4 days.  She reports that she has had a lot of nausea with this but denies any vomiting.  The patient states that she does have a shoulder injury that occurred few weeks ago and has been taking anti-inflammatories as well as Tylenol for this.  She has had some nausea but denies any vomiting.  She reports that she normally has a bowel movement every day and has not had a bowel movement today.  She is still passing flatus.  She came to the ER today due to the symptoms.  She denies any exacerbating or alleviating factors but states that due to the nausea she has not had much of an appetite over the past few days.  She denies any chest pain with this.  Denies any shortness of breath.  The pain is worse with movement or if she presses on her upper abdomen.  She denies starting any new medications.   Abdominal Pain Associated symptoms: nausea        Home Medications Prior to Admission medications   Medication Sig Start Date End Date Taking? Authorizing Provider  albuterol (VENTOLIN HFA) 108 (90 Base) MCG/ACT inhaler Inhale 2 puffs into the lungs every 4 (four) hours as needed. 05/08/22  Yes [provider]  ALPRAZolam Prudy Feeler) 0.5 MG tablet TAKE 1 TABLET BY MOUTH TWICE A DAY AS NEEDED FOR ANXIETY 06/02/23  Yes Sheliah Hatch, MD  benazepril-hydrochlorthiazide (LOTENSIN HCT) 10-12.5 MG tablet TAKE 1 TABLET BY MOUTH EVERY DAY 06/02/23  Yes Sheliah Hatch, MD  fluticasone [facility]) 50 MCG/ACT nasal spray SPRAY 2  SPRAYS INTO EACH NOSTRIL EVERY DAY 03/30/22  Yes Sheliah Hatch, MD  hyoscyamine (LEVSIN/SL) 0.125 MG SL tablet Place 1 tablet (0.125 mg total) under the tongue every 4 (four) hours as needed. 07/09/23  Yes Durwin Glaze, MD  ketorolac (TORADOL) 10 MG tablet Take 1 tablet (10 mg total) by mouth every 6 (six) hours as needed. 07/09/23  Yes Durwin Glaze, MD  lidocaine (LIDODERM) 5 % Place 1 patch onto the skin daily. Remove & Discard patch within 12 hours or as directed by MD 06/14/23  Yes Palumbo, April, MD  Multiple Vitamin (MULTIVITAMIN WITH MINERALS) TABS tablet Take 1 tablet by mouth daily.   Yes [provider]  SUBOXONE 8-2 MG FILM Place 1 Film under the tongue 2 (two) times daily.  04/18/13  Yes [provider]  venlafaxine XR (EFFEXOR-XR) 75 MG 24 hr capsule TAKE 1 CAPSULE BY MOUTH DAILY WITH BREAKFAST. 06/02/23  Yes Sheliah Hatch, MD      Allergies    Patient has no known allergies.    Review of Systems   Review of Systems  Gastrointestinal:  Positive for abdominal pain and nausea.  All other systems reviewed and are negative.   Physical Exam Updated Vital Signs BP (!) 148/92 (BP Location: Right Arm)   Pulse 83   Temp 98.8 F (37.1 C) (Oral)   Resp 18   Ht 5'  1" (1.549 m)   Wt 82.6 kg   SpO2 97%   BMI 34.39 kg/m  Physical Exam Vitals and nursing note reviewed.   Gen: Appears uncomfortable Eyes: PERRL, EOMI HEENT: no oropharyngeal swelling Neck: trachea midline Resp: clear to auscultation bilaterally Card: RRR, no murmurs, rubs, or gallops Abd: Tender over the epigastrium, right upper quadrant, left upper quadrant Extremities: no calf tenderness, no edema Vascular: 2+ radial pulses bilaterally, 2+ DP pulses bilaterally Skin: no rashes Psyc: acting appropriately   ED Results / Procedures / Treatments   Labs (all labs ordered are listed, but only abnormal results are displayed) Labs Reviewed  COMPREHENSIVE METABOLIC PANEL - Abnormal;  Notable for the following components:      Result Value   Potassium 3.4 (*)    Glucose, Bld 115 (*)    All other components within normal limits  LIPASE, BLOOD  CBC WITH DIFFERENTIAL/PLATELET  URINALYSIS, ROUTINE W REFLEX MICROSCOPIC    EKG EKG Interpretation Date/Time:  Friday July 09 2023 17:33:50 EST Ventricular Rate:  81 PR Interval:  159 QRS Duration:  85 QT Interval:  371 QTC Calculation: 431 R Axis:   50  Text Interpretation: Sinus rhythm Low voltage, precordial leads Confirmed by Beckey Downing [id number]) on 07/09/2023 6:06:13 PM  Radiology CT ABDOMEN PELVIS W CONTRAST Result Date: 07/09/2023 CLINICAL DATA:  Epigastric pain EXAM: CT ABDOMEN AND PELVIS WITH CONTRAST TECHNIQUE: Multidetector CT imaging of the abdomen and pelvis was performed using the standard protocol following bolus administration of intravenous contrast. RADIATION DOSE REDUCTION: This exam was performed according to the departmental dose-optimization program which includes automated exposure control, adjustment of the mA and/or kV according to patient size and/or use of iterative reconstruction technique. CONTRAST:  75mL OMNIPAQUE IOHEXOL 350 MG/ML SOLN COMPARISON:  04/02/2010 and ultrasound 04/23/2015 FINDINGS: Lower chest: Unremarkable Hepatobiliary: Cholecystectomy. Common bile duct 0.6 cm in diameter, upper normal caliber, with minimal intrahepatic biliary dilatation. No focal parenchymal lesion in the liver identified. Pancreas: Unremarkable Spleen: Unremarkable Adrenals/Urinary Tract: Mild thickening of the adrenal glands without discrete mass. Urinary bladder and kidneys appear unremarkable. Stomach/Bowel: Diverticulum of the transverse duodenum without findings of inflammation. Mild focal thickening of the distal appendix up to 9 mm on image 92 series 6, without surrounding inflammatory stranding. I cannot exclude a small appendiceal mass; I do note that the appendix also had a mildly thickened appearance in  2011, but without surrounding inflammatory findings. Diverticulosis especially in the descending colon but also in the transverse and sigmoid colon. No compelling findings of acute diverticulitis. Vascular/Lymphatic: Atherosclerosis is present, including aortoiliac atherosclerotic disease. Reproductive: Unremarkable Other: No supplemental non-categorized findings. Musculoskeletal: Mild to moderate degenerative hip arthropathy bilaterally. Mild grade 1 degenerative retrolisthesis at L1-2 and moderate grade 1 degenerative anterolisthesis at L4-5. Foraminal impingement at L3-4 and L4-5 due to spondylosis and degenerative disc disease. IMPRESSION: 1. Mild focal thickening of the distal appendix up to 9 mm, without surrounding inflammatory stranding. I cannot exclude a small appendiceal mass; I do note that the appendix also had a mildly thickened appearance in 2011, but without surrounding inflammatory findings. Surgical referral is recommended, with physical exam findings and clinical suspicion for appendicitis taken into account in determining the urgency of the referral. 2. Diverticulosis without findings of acute diverticulitis. 3. Cholecystectomy. Common bile duct 0.6 cm in diameter, upper normal caliber, with minimal intrahepatic biliary dilatation. 4. Foraminal impingement at L3-4 and L4-5 due to spondylosis and degenerative disc disease. 5. Mild to moderate degenerative hip arthropathy bilaterally. 6.  Aortic Atherosclerosis (ICD10-I70.0). Electronically Signed   By: Gaylyn Rong M.D.   On: 07/09/2023 18:25    Procedures Procedures    Medications Ordered in ED Medications  morphine (PF) 2 MG/ML injection 2 mg (2 mg Intravenous Not Given 07/09/23 1300)  ketorolac (TORADOL) 15 MG/ML injection 15 mg (has no administration in time range)  hyoscyamine (LEVSIN SL) SL tablet 0.25 mg (0.25 mg Sublingual Given 07/09/23 1754)  morphine (PF) 4 MG/ML injection 4 mg (4 mg Intravenous Given 07/09/23 1754)   ondansetron (ZOFRAN) injection 4 mg (4 mg Intravenous Given 07/09/23 1755)  sodium chloride 0.9 % bolus 1,000 mL (0 mLs Intravenous Stopped 07/09/23 1912)  iohexol (OMNIPAQUE) 350 MG/ML injection 75 mL (75 mLs Intravenous Contrast Given 07/09/23 1631)  cefTRIAXone (ROCEPHIN) 2 g in sodium chloride 0.9 % 100 mL IVPB (0 g Intravenous Stopped 07/09/23 2008)    And  metroNIDAZOLE (FLAGYL) IVPB 500 mg (0 mg Intravenous Stopped 07/09/23 2123)  morphine (PF) 4 MG/ML injection 4 mg (4 mg Intravenous Given 07/09/23 2009)    ED Course/ Medical Decision Making/ A&P                                 Medical Decision Making 63 year old female with remote history of polysubstance abuse and cholecystectomy in the past presenting to the emergency department today with upper abdominal pain.  This is very reproducible on exam.  Patient is tachycardic here on arrival.  Will obtain an EKG to evaluate for tachycardia other than sinus tachycardia as well as for atypical ACS although suspicion for this is low as her pain is reproducible.  I will further evaluate her basic labs including LFTs and lipase to evaluate for hepatic blood pathology or pancreatitis.  Obtain a CT scan to evaluate for bowel obstruction, diverticulitis, colitis, perforated viscus, or other intra-abdominal pathology.  Will give a GI cocktail as well as morphine and Zofran for symptoms in the event that this is due to GERD or gastritis.  I will reevaluate for ultimate disposition.  The patient's CT scan does show some inflammatory changes around her appendix with some thickening.  The thickening does appear old but the inflammatory changes do appear to be new.  On reassessment the patient is tender over the right lower quadrant with no guarding or rebound.  She does have some generalized pain as well and is tender all over.  A call was placed to surgery.  The patient is covered antibiotics.  I did discuss the patient's case with Dr. Bonita Quin who reviewed the  patient's CT scan.  There was no inflammation noted.  On reassessment the patient is feeling better.  She is ultimately discharged through shared decision making and is encouraged to follow-up with general surgery as an outpatient as Dr. Bonita Quin does think that the patient will need an appendectomy not emergently for possible appendiceal mass but her CT scan does not show inflammatory changes consistent with acute appendicitis.  She does continue to have some mild diffuse abdominal discomfort without guarding or rebound it is not localizing.  I think that this is reasonable.  She is discharged with return precautions.  Risk Prescription drug management.           Final Clinical Impression(s) / ED Diagnoses Final diagnoses:  Abdominal pain, unspecified abdominal location    Rx / DC Orders ED Discharge Orders          Ordered  ketorolac (TORADOL) 10 MG tablet  Every 6 hours PRN        07/09/23 2233    hyoscyamine (LEVSIN/SL) 0.125 MG SL tablet  Every 4 hours PRN        07/09/23 2235              Durwin Glaze, MD 07/09/23 2235

## 2023-07-09 NOTE — Discharge Instructions (Addendum)
Your CT scan does show that your appendix does look a little abnormal but this was seen on a previous CT scan.  There were no inflammatory changes consistent with appendicitis.  Please follow-up with the surgeon at the number provided for reevaluation.  Please take the Toradol for pain.  Do not take ibuprofen or Aleve with this as this can interact.  Please take the Levsin as well.  Return to the emergency department for fever or worsening symptoms.

## 2023-07-12 ENCOUNTER — Other Ambulatory Visit (HOSPITAL_COMMUNITY): Payer: Self-pay

## 2023-07-12 ENCOUNTER — Ambulatory Visit (INDEPENDENT_AMBULATORY_CARE_PROVIDER_SITE_OTHER): Payer: 59

## 2023-07-12 ENCOUNTER — Encounter (HOSPITAL_COMMUNITY): Payer: Self-pay

## 2023-07-12 ENCOUNTER — Ambulatory Visit (HOSPITAL_COMMUNITY)
Admission: RE | Admit: 2023-07-12 | Discharge: 2023-07-12 | Disposition: A | Discharge status: Home / Self Care | Payer: 59 | Source: Ambulatory Visit | Attending: Emergency Medicine | Admitting: Emergency Medicine

## 2023-07-12 VITALS — BP 115/85 | HR 89 | Temp 97.9°F | Resp 16

## 2023-07-12 DIAGNOSIS — R051 Acute cough: Secondary | ICD-10-CM

## 2023-07-12 DIAGNOSIS — B349 Viral infection, unspecified: Secondary | ICD-10-CM

## 2023-07-12 DIAGNOSIS — R062 Wheezing: Secondary | ICD-10-CM | POA: Diagnosis not present

## 2023-07-12 LAB — POC COVID19/FLU A&B COMBO
Covid Antigen, POC: NEGATIVE
Influenza A Antigen, POC: NEGATIVE
Influenza B Antigen, POC: NEGATIVE

## 2023-07-12 MED ORDER — PROMETHAZINE-DM 6.25-15 MG/5ML PO SYRP
5.0000 mL | ORAL_SOLUTION | Freq: Four times a day (QID) | ORAL | 0 refills | Status: AC | PRN
  Filled 2023-07-12: qty 240, 12d supply, fill #0

## 2023-07-12 MED ORDER — ACETAMINOPHEN 325 MG PO TABS
975.0000 mg | ORAL_TABLET | Freq: Once | ORAL | Status: AC
  Administered 2023-07-12: 975 mg via ORAL

## 2023-07-12 MED ORDER — IPRATROPIUM-ALBUTEROL 0.5-2.5 (3) MG/3ML IN SOLN
RESPIRATORY_TRACT | Status: AC
  Filled 2023-07-12: qty 3

## 2023-07-12 MED ORDER — IPRATROPIUM-ALBUTEROL 0.5-2.5 (3) MG/3ML IN SOLN
3.0000 mL | Freq: Once | RESPIRATORY_TRACT | Status: AC
  Administered 2023-07-12: 3 mL via RESPIRATORY_TRACT

## 2023-07-12 MED ORDER — ACETAMINOPHEN 325 MG PO TABS
ORAL_TABLET | ORAL | Status: AC
  Filled 2023-07-12: qty 3

## 2023-07-12 MED ORDER — PREDNISONE 20 MG PO TABS
40.0000 mg | ORAL_TABLET | Freq: Every day | ORAL | 0 refills | Status: AC
  Filled 2023-07-12: qty 10, 5d supply, fill #0

## 2023-07-12 NOTE — ED Provider Notes (Signed)
[facility]    CSN: [id number] Arrival date & time: 07/12/23  1233      History   Chief Complaint Chief Complaint  Patient presents with   Cough   Headache   Nasal Congestion   Generalized Body Aches   Fever    HPI Vanessa [name] is a 63 y.o. female.  Last night developed headache, cough, nasal congestion, and body aches. Reports tactile fever but not measured  Denies shortness of breath or wheezing. No chest pain. Not having abd pain, NVD, or rash Reporting headache and body aches as 9/10 pain Used a Goody powder last night, no other meds  She was seen in the ED 3 days ago for abd pain Likely several sick contacts   Past Medical History:  Diagnosis Date   Anxiety    Arthritis    Chronic knee pain    Depression    Diverticulosis    Gall stone    Hepatitis C    Hx of Hepatitis C   Hypertension    Syphilis     Patient Active Problem List   Diagnosis Date Noted   Primary osteoarthritis of right knee 03/15/2020   Status post total knee replacement, right 03/15/2020   Incontinence of feces    Morbid obesity (HCC) 07/12/2018   Allergic rhinitis 07/22/2017   Tear of medial meniscus of right knee, current 03/03/2017   Tear of lateral meniscus of right knee, current 03/03/2017   Vitamin D deficiency 12/04/2016   Baker's cyst of knee, right 05/21/2016   Edema 12/03/2015   Liver fibrosis 07/25/2015   S/P laparoscopic cholecystectomy 11/25/2014   Knee pain, right 07/17/2014   Physical exam 07/17/2014   Pap smear for cervical cancer screening 07/17/2014   Hyperlipidemia 01/12/2014   History of hepatitis C 04/27/2013   Substance abuse in remission (HCC) 04/27/2013   Hypertension    Depression     Past Surgical History:  Procedure Laterality Date   ANAL RECTAL MANOMETRY N/A 02/20/2019   Procedure: ANO RECTAL MANOMETRY;  Surgeon: Rachael Fee, MD;  Location: [facility];  Service: Endoscopy;  Laterality: N/A;   CARPAL TUNNEL RELEASE Left     CHOLECYSTECTOMY N/A 11/24/2014   Procedure: LAPAROSCOPIC CHOLECYSTECTOMY WITH INTRAOPERATIVE CHOLANGIOGRAM;  Surgeon: Gaynelle Adu, MD;  Location: [facility];  Service: General;  Laterality: N/A;   ECTOPIC PREGNANCY SURGERY     TOTAL KNEE ARTHROPLASTY Right 03/15/2020   Procedure: TOTAL KNEE ARTHROPLASTY;  Surgeon: Jodi Geralds, MD;  Location: [facility];  Service: Orthopedics;  Laterality: Right;    OB History   No obstetric history on file.      Home Medications    Prior to Admission medications   Medication Sig Start Date End Date Taking? Authorizing Provider  predniSONE (DELTASONE) 20 MG tablet Take 2 tablets (40 mg total) by mouth daily with breakfast for 5 days. 07/12/23 07/17/23 Yes Isaiyah Feldhaus, Lurena Joiner, PA-C  promethazine-dextromethorphan (PROMETHAZINE-DM) 6.25-15 MG/5ML syrup Take 5 mLs by mouth 4 (four) times daily as needed for cough. 07/12/23  Yes Shontel Santee, Lurena Joiner, PA-C  albuterol (VENTOLIN HFA) 108 (90 Base) MCG/ACT inhaler Inhale 2 puffs into the lungs every 4 (four) hours as needed. 05/08/22   [provider]  ALPRAZolam Prudy Feeler) 0.5 MG tablet TAKE 1 TABLET BY MOUTH TWICE A DAY AS NEEDED FOR ANXIETY 06/02/23   Sheliah Hatch, MD  benazepril-hydrochlorthiazide (LOTENSIN HCT) 10-12.5 MG tablet TAKE 1 TABLET BY MOUTH EVERY DAY 06/02/23   Sheliah Hatch, MD  fluticasone Aleda Grana)  50 MCG/ACT nasal spray SPRAY 2 SPRAYS INTO EACH NOSTRIL EVERY DAY 03/30/22   Sheliah Hatch, MD  hyoscyamine (LEVSIN/SL) 0.125 MG SL tablet Place 1 tablet (0.125 mg total) under the tongue every 4 (four) hours as needed. 07/09/23   Durwin Glaze, MD  ketorolac (TORADOL) 10 MG tablet Take 1 tablet (10 mg total) by mouth every 6 (six) hours as needed. 07/09/23   Durwin Glaze, MD  Multiple Vitamin (MULTIVITAMIN WITH MINERALS) TABS tablet Take 1 tablet by mouth daily.    [provider]  SUBOXONE 8-2 MG FILM Place 1 Film under the tongue 2 (two) times daily.  04/18/13   [provider]   venlafaxine XR (EFFEXOR-XR) 75 MG 24 hr capsule TAKE 1 CAPSULE BY MOUTH DAILY WITH BREAKFAST. 06/02/23   Sheliah Hatch, MD    Family History Family History  Problem Relation Age of Onset   Hypertension Mother    Diabetes Mother    Drug abuse Brother    Esophageal cancer Brother 81   Colon cancer Brother 105   Hypertension Sister        x's 2   Diabetes Sister        oldest sister   Stomach cancer Neg Hx    Breast cancer Neg Hx    Rectal cancer Neg Hx     Social History Social History   Tobacco Use   Smoking status: Every Day    Current packs/day: 0.00    Average packs/day: 1 pack/day for 35.0 years (35.0 ttl pk-yrs)    Types: E-cigarettes, Cigarettes    Start date: 05/25/1994    Last attempt to quit: 03/03/2016    Years since quitting: 7.3   Smokeless tobacco: Never  Vaping Use   Vaping status: Former   Substances: Nicotine, Flavoring  Substance Use Topics   Alcohol use: No    Alcohol/week: 0.0 standard drinks of alcohol    Comment: "last alcohol was in the 1990's"   Drug use: Not Currently    Types: Marijuana, "Crack" cocaine    Comment: "last drug use was in the 1990's; never used heroin"     Allergies   Patient has no known allergies.   Review of Systems Review of Systems Per HPI  Physical Exam Triage Vital Signs ED Triage Vitals [07/12/23 1310]  Encounter Vitals Group     BP      Systolic BP Percentile      Diastolic BP Percentile      Pulse      Resp      Temp      Temp src      SpO2      Weight      Height      Head Circumference      Peak Flow      Pain Score 9     Pain Loc      Pain Education      Exclude from Growth Chart    No data found.  Updated Vital Signs BP 115/85 (BP Location: Right Arm)   Pulse 89   Temp 97.9 F (36.6 C) (Oral)   Resp 16   SpO2 97%   Physical Exam Vitals and nursing note reviewed.  Constitutional:      General: She is not in acute distress.    Appearance: She is not ill-appearing.  HENT:      Right Ear: Tympanic membrane and ear canal normal.     Left Ear: Tympanic  membrane and ear canal normal.     Nose: No rhinorrhea.     Mouth/Throat:     Mouth: Mucous membranes are moist.     Pharynx: Oropharynx is clear. No posterior oropharyngeal erythema.  Eyes:     Conjunctiva/sclera: Conjunctivae normal.  Cardiovascular:     Rate and Rhythm: Normal rate and regular rhythm.     Pulses: Normal pulses.     Heart sounds: Normal heart sounds.  Pulmonary:     Effort: Pulmonary effort is normal.     Breath sounds: Wheezing (faint expiratory) present.  Musculoskeletal:     Cervical back: Normal range of motion.  Lymphadenopathy:     Cervical: No cervical adenopathy.  Skin:    General: Skin is warm and dry.  Neurological:     Mental Status: She is alert and oriented to person, place, and time.     UC Treatments / Results  Labs (all labs ordered are listed, but only abnormal results are displayed) Labs Reviewed  POC COVID19/FLU A&B COMBO    EKG  Radiology DG Chest 2 View Result Date: 07/12/2023 CLINICAL DATA:  Wheezing and cough.  Low O2 saturation. EXAM: CHEST - 2 VIEW COMPARISON:  Chest radiograph dated 03/12/2020. FINDINGS: No focal consolidation, pleural effusion, or pneumothorax. The cardiac silhouette is within normal limits. No acute osseous pathology. Degenerative changes of the spine. IMPRESSION: No active cardiopulmonary disease. Electronically Signed   By: Elgie Collard M.D.   On: 07/12/2023 16:47    Procedures Procedures   Medications Ordered in UC Medications  acetaminophen (TYLENOL) tablet 975 mg (975 mg Oral Given 07/12/23 1325)  ipratropium-albuterol (DUONEB) 0.5-2.5 (3) MG/3ML nebulizer solution 3 mL (3 mLs Nebulization Given 07/12/23 1424)    Initial Impression / Assessment and Plan / UC Course  I have reviewed the triage vital signs and the nursing notes.  Pertinent labs & imaging results that were available during my care of the patient were reviewed  by me and considered in my medical decision making (see chart for details).  Currently afebrile. She is originally sating 91% on room air There is faint wheezing heard on lung exam Patient states she has no issue breathing and does not feel short of breath A DuoNeb is given She stabilized at 97% room air and stayed there for remainder of visit  Tylenol given for aches, patient reports headache has resolved after medication. Rapid flu A/B and covid are negative Chest xray negative   Sent prednisone; 40 mg daily x 5 days for wheezing, reactive airway Promethazine DM sent for cough Continue tylenol for aches Return precautions discussed. Patient agrees to plan   Final Clinical Impressions(s) / UC Diagnoses   Final diagnoses:  Acute cough  Wheezing  Viral illness     Discharge Instructions      Starting tomorrow morning, take prednisone 40 mg daily for the next 5 days  The promethazine DM cough syrup can be used up to 4 times daily. If this medication makes you drowsy, take only once before bed.  The [facility] has the most affordable options for medications.  There is a list attached.  You can ask the pharmacist for over-the-counter equivalents if prescription medicines are too pricey.  Continue tylenol for headache/body aches Drink lots of fluids!     ED Prescriptions     Medication Sig Dispense Auth. Provider   predniSONE (DELTASONE) 20 MG tablet Take 2 tablets (40 mg total) by mouth daily with breakfast for 5 days.  10 tablet Wing Gfeller, PA-C   promethazine-dextromethorphan (PROMETHAZINE-DM) 6.25-15 MG/5ML syrup Take 5 mLs by mouth 4 (four) times daily as needed for cough. 240 mL Keandra Medero, Lurena Joiner, PA-C      PDMP not reviewed this encounter.   Marlow Baars, [place] 07/12/23 1308

## 2023-07-12 NOTE — ED Triage Notes (Signed)
Patient c/o headache, a productive cough with light yellow sputum, nasal congestion, body aches, and fever since last night.  Patient states she took a Goody's headache powder last night.

## 2023-07-12 NOTE — Discharge Instructions (Addendum)
Starting tomorrow morning, take prednisone 40 mg daily for the next 5 days  The promethazine DM cough syrup can be used up to 4 times daily. If this medication makes you drowsy, take only once before bed.  The [place] [facility] has the most affordable options for medications.  There is a list attached.  You can ask the pharmacist for over-the-counter equivalents if prescription medicines are too pricey.  Continue tylenol for headache/body aches Drink lots of fluids!

## 2023-07-13 ENCOUNTER — Ambulatory Visit: Payer: 59 | Admitting: Physical Therapy

## 2023-07-13 NOTE — Therapy (Incomplete)
OUTPATIENT PHYSICAL THERAPY EVALUATION   Patient Name: Vanessa [name] MRN: [id number] DOB:1961-04-01, 63 y.o., female Today's Date: 07/13/2023  END OF SESSION:   Past Medical History:  Diagnosis Date   Anxiety    Arthritis    Chronic knee pain    Depression    Diverticulosis    Gall stone    Hepatitis C    Hx of Hepatitis C   Hypertension    Syphilis    Past Surgical History:  Procedure Laterality Date   ANAL RECTAL MANOMETRY N/A 02/20/2019   Procedure: ANO RECTAL MANOMETRY;  Surgeon: Rachael Fee, MD;  Location: [facility];  Service: Endoscopy;  Laterality: N/A;   CARPAL TUNNEL RELEASE Left    CHOLECYSTECTOMY N/A 11/24/2014   Procedure: LAPAROSCOPIC CHOLECYSTECTOMY WITH INTRAOPERATIVE CHOLANGIOGRAM;  Surgeon: Gaynelle Adu, MD;  Location: [facility];  Service: General;  Laterality: N/A;   ECTOPIC PREGNANCY SURGERY     TOTAL KNEE ARTHROPLASTY Right 03/15/2020   Procedure: TOTAL KNEE ARTHROPLASTY;  Surgeon: Jodi Geralds, MD;  Location: [facility];  Service: Orthopedics;  Laterality: Right;   Patient Active Problem List   Diagnosis Date Noted   Primary osteoarthritis of right knee 03/15/2020   Status post total knee replacement, right 03/15/2020   Incontinence of feces    Morbid obesity (HCC) 07/12/2018   Allergic rhinitis 07/22/2017   Tear of medial meniscus of right knee, current 03/03/2017   Tear of lateral meniscus of right knee, current 03/03/2017   Vitamin D deficiency 12/04/2016   Baker's cyst of knee, right 05/21/2016   Edema 12/03/2015   Liver fibrosis 07/25/2015   S/P laparoscopic cholecystectomy 11/25/2014   Knee pain, right 07/17/2014   Physical exam 07/17/2014   Pap smear for cervical cancer screening 07/17/2014   Hyperlipidemia 01/12/2014   History of hepatitis C 04/27/2013   Substance abuse in remission (HCC) 04/27/2013   Hypertension    Depression     PCP: Sheliah Hatch, MD  REFERRING PROVIDER: Tarry Kos, MD  REFERRING DIAG:  [phone] (ICD-10-CM) - Chronic left shoulder pain  Rationale for Evaluation and Treatment: Rehabilitation  THERAPY DIAG:  No diagnosis found.  ONSET DATE: ***   SUBJECTIVE:                                                                                                                                                                                           SUBJECTIVE STATEMENT: ***  PERTINENT HISTORY:  Anxiety, depression, OA, HTN  PAIN:  Are you having pain? Yes: NPRS scale: *** Pain location: *** Pain description: *** Aggravating factors: *** Relieving factors: ***  PRECAUTIONS:  None  RED FLAGS: None   WEIGHT BEARING RESTRICTIONS:  No  FALLS:  Has patient fallen in last 6 months? {fallsyesno:27318}  LIVING ENVIRONMENT: Lives with: {[facility] lives with:25569::"lives with their family"} Lives in: {Lives in:25570} Stairs: {opstairs:27293} Has following equipment at home: {Assistive devices:23999}  OCCUPATION:  ***  PLOF:  {PLOF:24004}  PATIENT GOALS:  ***   OBJECTIVE:  Note: Objective measures were completed at Evaluation unless otherwise noted.  DIAGNOSTIC FINDINGS:  ***  PATIENT SURVEYS:  07/13/23  Patient-specific activity scoring scheme   "0" represents "unable to perform." "10" represents "able to perform at prior level. 0 1 2 3 4 5 6 7 8 9  10 (Date and Score) Activity Initial  Activity Eval     *** ***     2.   ***  ***    3.   *** ***   4.    5.     Total score = sum of the activity scores/number of activities Minimum detectable change (90%CI) for average score = 2 points Minimum detectable change (90%CI) for single activity score = 3 points   Score: ***   COGNITIVE STATUS: {cognition:24006}   SENSATION: {sensation:27233}  POSTURE:  {posture:25561}  HAND DOMINANCE:  {MISC; OT HAND DOMINANCE:[phone]}  GAIT: 07/13/23 Comments: ***   PALPATION: 07/13/23 ***   UPPER EXTREMITY ROM:  ROM Right eval  Left eval  Shoulder flexion    Shoulder extension    Shoulder abduction    Shoulder adduction    Shoulder extension    Shoulder internal rotation    Shoulder external rotation    Elbow flexion    Elbow extension    Wrist flexion    Wrist extension    Wrist ulnar deviation    Wrist radial deviation    Wrist pronation    Wrist supination     (Blank rows = not tested)   UPPER EXTREMITY MMT:  MMT Right eval Left eval  Shoulder flexion    Shoulder extension    Shoulder abduction    Shoulder adduction    Shoulder extension    Shoulder internal rotation    Shoulder external rotation    Middle trapezius    Lower trapezius    Elbow flexion    Elbow extension    Wrist flexion    Wrist extension    Wrist ulnar deviation    Wrist radial deviation    Wrist pronation    Wrist supination    Grip strength     (Blank rows = not tested)    SPECIAL TESTS:  07/13/23 {PT Special Tests:29286}  FUNCTIONAL TESTS:  07/13/23 {Functional tests:24029}   TREATMENT:                                                                                                                              DATE:  07/13/23 See HEP - demonstrated with trial reps performed PRN, *** cues for comprehension   ***  PATIENT EDUCATION:  Education details: HEP, *** Person educated: {Person educated:25204} Education method: {Education Method:25205} Education comprehension: {Education Comprehension:25206}  HOME EXERCISE PROGRAM: ***   ASSESSMENT:  CLINICAL IMPRESSION: Patient is a 63 y.o. female who was seen today for physical therapy evaluation and treatment for Lt shoulder pain. She demonstrates *** affecting functional mobility.  She will benefit from PT to address deficits listed.     OBJECTIVE IMPAIRMENTS: {opptimpairments:25111}.   ACTIVITY LIMITATIONS: {activitylimitations:27494}  PARTICIPATION LIMITATIONS: {participationrestrictions:25113}  PERSONAL FACTORS: 3+ comorbidities:  Anxiety, depression, OA, HTN  are also affecting patient's functional outcome.   REHAB POTENTIAL: {rehabpotential:25112}  CLINICAL DECISION MAKING: {clinical decision making:25114}  EVALUATION COMPLEXITY: {Evaluation complexity:25115}   GOALS: Goals reviewed with patient? Yes  SHORT TERM GOALS: Target date: ***  Independent with initial HEP Goal status: INITIAL  2.  *** Goal status: INITIAL  3.  *** Goal status: INITIAL   LONG TERM GOALS: Target date: ***  Independent with final HEP Goal status: INITIAL  2.  FOTO score improved to *** Goal status: INITIAL  3.  *** improved to *** for *** Goal status: INIITAL  4.  Report pain < ***/10 with *** for improved function Goal status: INITIAL  5.  *** Goal status: INITIAL  6.  *** Goal status: INITIAL     PLAN:  PT FREQUENCY: {rehab frequency:25116}  PT DURATION: {rehab duration:25117}  PLANNED INTERVENTIONS: {rehab planned interventions:25118::"97110-Therapeutic exercises","97530- Therapeutic [phone]- Neuromuscular re-education","97535- Self QMVH","84696- Manual therapy"}.  PLAN FOR NEXT SESSION: review HEP, ***   NEXT MD VISIT: PRN   Clarita Crane, PT, DPT 07/13/23 7:27 AM

## 2023-07-20 ENCOUNTER — Ambulatory Visit (INDEPENDENT_AMBULATORY_CARE_PROVIDER_SITE_OTHER): Payer: 59 | Admitting: Physical Therapy

## 2023-07-20 ENCOUNTER — Encounter: Payer: Self-pay | Admitting: Physical Therapy

## 2023-07-20 DIAGNOSIS — G8929 Other chronic pain: Secondary | ICD-10-CM

## 2023-07-20 DIAGNOSIS — M6281 Muscle weakness (generalized): Secondary | ICD-10-CM | POA: Diagnosis not present

## 2023-07-20 DIAGNOSIS — M25512 Pain in left shoulder: Secondary | ICD-10-CM | POA: Diagnosis not present

## 2023-07-20 NOTE — Therapy (Signed)
 OUTPATIENT PHYSICAL THERAPY EVALUATION   Patient Name: Vanessa [name] MRN: [id number] DOB:08/14/1960, 63 y.o., female Today's Date: 07/20/2023  END OF SESSION:  PT End of Session - 07/20/23 1048     Visit Number 1    Number of Visits 8    Date for PT Re-Evaluation 09/14/23    PT Start Time 1010    PT Stop Time 1045    PT Time Calculation (min) 35 min    Activity Tolerance Patient tolerated treatment well;Patient limited by pain    Behavior During Therapy [facility] for tasks assessed/performed             Past Medical History:  Diagnosis Date   Anxiety    Arthritis    Chronic knee pain    Depression    Diverticulosis    Gall stone    Hepatitis C    Hx of Hepatitis C   Hypertension    Syphilis    Past Surgical History:  Procedure Laterality Date   ANAL RECTAL MANOMETRY N/A 02/20/2019   Procedure: ANO RECTAL MANOMETRY;  Surgeon: Rachael Fee, MD;  Location: [facility];  Service: Endoscopy;  Laterality: N/A;   CARPAL TUNNEL RELEASE Left    CHOLECYSTECTOMY N/A 11/24/2014   Procedure: LAPAROSCOPIC CHOLECYSTECTOMY WITH INTRAOPERATIVE CHOLANGIOGRAM;  Surgeon: Gaynelle Adu, MD;  Location: [facility];  Service: General;  Laterality: N/A;   ECTOPIC PREGNANCY SURGERY     TOTAL KNEE ARTHROPLASTY Right 03/15/2020   Procedure: TOTAL KNEE ARTHROPLASTY;  Surgeon: Jodi Geralds, MD;  Location: [facility];  Service: Orthopedics;  Laterality: Right;   Patient Active Problem List   Diagnosis Date Noted   Primary osteoarthritis of right knee 03/15/2020   Status post total knee replacement, right 03/15/2020   Incontinence of feces    Morbid obesity (HCC) 07/12/2018   Allergic rhinitis 07/22/2017   Tear of medial meniscus of right knee, current 03/03/2017   Tear of lateral meniscus of right knee, current 03/03/2017   Vitamin D deficiency 12/04/2016   Baker's cyst of knee, right 05/21/2016   Edema 12/03/2015   Liver fibrosis 07/25/2015   S/P laparoscopic cholecystectomy 11/25/2014   Knee  pain, right 07/17/2014   Physical exam 07/17/2014   Pap smear for cervical cancer screening 07/17/2014   Hyperlipidemia 01/12/2014   History of hepatitis C 04/27/2013   Substance abuse in remission (HCC) 04/27/2013   Hypertension    Depression     PCP: Sheliah Hatch, MD  REFERRING PROVIDER: Tarry Kos, MD  REFERRING DIAG: [phone] (ICD-10-CM) - Chronic left shoulder pain  Rationale for Evaluation and Treatment: Rehabilitation  THERAPY DIAG:  Chronic left shoulder pain  Muscle weakness (generalized)  ONSET DATE: pain since October 2024   SUBJECTIVE:  SUBJECTIVE STATEMENT: She has been dealing with left shoulder pain since October 2024. Insidious onset of pain without injury. 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   PERTINENT HISTORY:  Anxiety, depression, OA, HTN  PAIN:  NPRS scale: 8/10 upon arrival Pain location:left lateral shoulder Pain description: constant, achy, sharp Aggravating factors:  Relieving factors: rest, meds   PRECAUTIONS:  None  RED FLAGS: None   WEIGHT BEARING RESTRICTIONS:  No  FALLS:  Has patient fallen in last 6 months? No  OCCUPATION:  Dialysis tech  PLOF:  Independent  PATIENT GOALS:  Reduce pain   OBJECTIVE:  Note: Objective measures were completed at Evaluation unless otherwise noted.  DIAGNOSTIC FINDINGS:  MRI IMPRESSION: 1. Moderate anterior greater than mid AP dimension of the supraspinatus tendinosis with probable tiny, thin, linear 4 x 1 mm longitudinal midsubstance tear within the anterior supraspinatus tendon footprint. No tendon retraction. 2. Two adjacent longitudinal linear fluid bright interstitial tears within the anterior infraspinatus tendon insertion  measuring only 1 mm in thickness and approximately 10 mm in length. No tendon retraction. 3. There is a 10 mm oval well corticated ossicle within the midsubstance of the mid height of the subscapularis tendon insertion. This likely reflects the sequela of a remote partial-thickness tear. 4. Mild proximal long head of the biceps tendinosis. 5. Moderate degenerative changes of the acromioclavicular joint. 6. Moderate glenohumeral cartilage thinning. Mild glenohumeral joint effusion with numerous small low signal foci, possible small loose bodies versus focal synovitis.  PATIENT SURVEYS:  07/13/23  Patient-specific activity scoring scheme   "0" represents "unable to perform." "10" represents "able to perform at prior level. 0 1 2 3 4 5 6 7 8 9  10 (Date and Score) Activity Initial  Activity Eval     Any movement of left shoulder 2    2.   Lifting arm over head  2    3.   Laying on left side 2            Total score = sum of the activity scores/number of activities Minimum detectable change (90%CI) for average score = 2 points Minimum detectable change (90%CI) for single activity score = 3 points   Score: 6/30, projected goal >15/10   COGNITIVE STATUS: Within functional limits for tasks assessed   SENSATION: WFL   HAND DOMINANCE:  Left   PALPATION: Eval: TTP left RTC insertion and lateral deltoid   UPPER EXTREMITY ROM:  ROM Right eval Left eval  Shoulder flexion A: 105   Shoulder extension    Shoulder abduction A:105   Shoulder adduction    Shoulder extension    Shoulder internal rotation L4   Shoulder external rotation Occiput   Elbow flexion    Elbow extension    Wrist flexion    Wrist extension    Wrist ulnar deviation    Wrist radial deviation    Wrist pronation    Wrist supination     (Blank rows = not tested)   UPPER EXTREMITY MMT:  MMT Right eval Left eval  Shoulder flexion 4   Shoulder extension    Shoulder abduction 4   Shoulder  adduction    Shoulder extension 4   Shoulder internal rotation 4   Shoulder external rotation 4   Middle trapezius    Lower trapezius    Elbow flexion    Elbow extension    Wrist flexion    Wrist extension  Wrist ulnar deviation    Wrist radial deviation    Wrist pronation    Wrist supination    Grip strength     (Blank rows = not tested)  TREATMENT:                                                                                                                              DATE:  07/20/23 HEP creation and review with demonstration, cues, and instructions not to push into pain. See below for details on specific exercises  Iontophoresis 4-6 hour wear home patch with 1.0CC dexamethasone and saline to left lateral shoulder at RTC insertion. Verbal instructions and education provided.      PATIENT EDUCATION:  Education details: HEP, MRI results, PT plan of care Person educated: Patient Education method: Explanation, Demonstration, Verbal cues, and Handouts Education comprehension: verbalized understanding and needs further education  HOME EXERCISE PROGRAM: Access Code: [email or web address] URL: [email or web address] Date: 07/20/2023 Prepared by: [name]  Exercises - Supine Shoulder Flexion Extension AAROM with Dowel  - 2 x daily - 6 x weekly - 1 sets - 10 reps - Supine Shoulder Abduction AAROM with Dowel  - 2 x daily - 6 x weekly - 1 sets - 10 reps - Supine Shoulder External Rotation with Dowel  - 2 x daily - 6 x weekly - 1 sets - 10 reps - Standing Shoulder Row with Anchored Resistance  - 2 x daily - 6 x weekly - 2 sets - 10 reps - Shoulder extension with resistance - Neutral  - 2 x daily - 6 x weekly - 2 sets - 10 reps - Shoulder Internal Rotation with Resistance (Mirrored)  - 2 x daily - 6 x weekly - 2 sets - 10 reps - Shoulder External Rotation with Anchored Resistance  - 2 x daily - 6 x weekly - 2 sets - 10 reps   ASSESSMENT:  CLINICAL IMPRESSION: Patient  is a 63 y.o. female who was seen today for physical therapy evaluation and treatment for Lt shoulder pain with OA and partial RTC tears.  She will benefit from PT to address deficits listed.     OBJECTIVE IMPAIRMENTS: decreased knowledge of condition, decreased ROM, decreased strength, impaired flexibility, and pain.   ACTIVITY LIMITATIONS: carrying, lifting, sleeping, dressing, and reach over head  PARTICIPATION LIMITATIONS: meal prep, cleaning, laundry, driving, and occupation  PERSONAL FACTORS: 3+ comorbidities: Anxiety, depression, OA, HTN  are also affecting patient's functional outcome.   REHAB POTENTIAL: Good  CLINICAL DECISION MAKING: Stable/uncomplicated  EVALUATION COMPLEXITY: Low   GOALS: Goals reviewed with patient? Yes  SHORT TERM GOALS: Target date: 08/17/2023    Independent with initial HEP Goal status: INITIAL  2.  Patient will reduce pain to less than 6/10 from 8/10 Goal status: INITIAL    LONG TERM GOALS: Target date: 09/14/2023    Independent with final HEP Goal status: INITIAL  2.  PSFS improved from 6/30 to >15/30 to show  improved function. Goal status: INITIAL  3.  UE strength improved to 4+/5 to improve function Goal status: INIITAL  4.  Report pain < 4/10 with normal activity for improved function Goal status: INITIAL  5.  ROM in left shoulder improved to [facility] at least 140 deg AROM for flexion and scaption to improve overhead reaching Goal status: INITIAL   PLAN:  PT FREQUENCY: 1x/week  PT DURATION: 8 weeks  PLANNED INTERVENTIONS: 97110-Therapeutic exercises, 97530- Therapeutic activity, [id number]- Neuromuscular re-education, 97535- Self Care, 40981- Manual therapy, G0283- Electrical stimulation (unattended), 97035- Ultrasound, 19147- Ionotophoresis 4mg /ml Dexamethasone, Dry Needling, and Joint mobilization.  PLAN FOR NEXT SESSION: review HEP, how was inoto? Gentle strength and ROM as tolerated to improve function and reduce  pain.   NEXT MD VISIT: PRN   [name], PT, DPT 07/20/23 10:49 AM

## 2023-07-27 ENCOUNTER — Ambulatory Visit: Payer: 59 | Admitting: Physical Therapy

## 2023-07-29 ENCOUNTER — Ambulatory Visit: Payer: 59 | Admitting: Physical Therapy

## 2023-07-29 ENCOUNTER — Encounter: Payer: Self-pay | Admitting: Physical Therapy

## 2023-07-29 DIAGNOSIS — M6281 Muscle weakness (generalized): Secondary | ICD-10-CM

## 2023-07-29 DIAGNOSIS — M25512 Pain in left shoulder: Secondary | ICD-10-CM | POA: Diagnosis not present

## 2023-07-29 DIAGNOSIS — G8929 Other chronic pain: Secondary | ICD-10-CM | POA: Diagnosis not present

## 2023-07-29 NOTE — Therapy (Signed)
 OUTPATIENT PHYSICAL THERAPY TREATMENT   Patient Name: Vanessa [name] MRN: [id number] DOB:Jan 03, 1961, 63 y.o., female Today's Date: 07/29/2023  END OF SESSION:  PT End of Session - 07/29/23 1548     Visit Number 2    Number of Visits 8    Date for PT Re-Evaluation 09/14/23    PT Start Time 1500    PT Stop Time 1540    PT Time Calculation (min) 40 min    Activity Tolerance Patient tolerated treatment well;Patient limited by pain    Behavior During Therapy [facility] for tasks assessed/performed              Past Medical History:  Diagnosis Date   Anxiety    Arthritis    Chronic knee pain    Depression    Diverticulosis    Gall stone    Hepatitis C    Hx of Hepatitis C   Hypertension    Syphilis    Past Surgical History:  Procedure Laterality Date   ANAL RECTAL MANOMETRY N/A 02/20/2019   Procedure: ANO RECTAL MANOMETRY;  Surgeon: Rachael Fee, MD;  Location: [facility];  Service: Endoscopy;  Laterality: N/A;   CARPAL TUNNEL RELEASE Left    CHOLECYSTECTOMY N/A 11/24/2014   Procedure: LAPAROSCOPIC CHOLECYSTECTOMY WITH INTRAOPERATIVE CHOLANGIOGRAM;  Surgeon: Gaynelle Adu, MD;  Location: [facility];  Service: General;  Laterality: N/A;   ECTOPIC PREGNANCY SURGERY     TOTAL KNEE ARTHROPLASTY Right 03/15/2020   Procedure: TOTAL KNEE ARTHROPLASTY;  Surgeon: Jodi Geralds, MD;  Location: [facility];  Service: Orthopedics;  Laterality: Right;   Patient Active Problem List   Diagnosis Date Noted   Primary osteoarthritis of right knee 03/15/2020   Status post total knee replacement, right 03/15/2020   Incontinence of feces    Morbid obesity (HCC) 07/12/2018   Allergic rhinitis 07/22/2017   Tear of medial meniscus of right knee, current 03/03/2017   Tear of lateral meniscus of right knee, current 03/03/2017   Vitamin D deficiency 12/04/2016   Baker's cyst of knee, right 05/21/2016   Edema 12/03/2015   Liver fibrosis 07/25/2015   S/P laparoscopic cholecystectomy 11/25/2014   Knee  pain, right 07/17/2014   Physical exam 07/17/2014   Pap smear for cervical cancer screening 07/17/2014   Hyperlipidemia 01/12/2014   History of hepatitis C 04/27/2013   Substance abuse in remission (HCC) 04/27/2013   Hypertension    Depression     PCP: Sheliah Hatch, MD  REFERRING PROVIDER: Tarry Kos, MD  REFERRING DIAG: [phone] (ICD-10-CM) - Chronic left shoulder pain  Rationale for Evaluation and Treatment: Rehabilitation  THERAPY DIAG:  Chronic left shoulder pain  Muscle weakness (generalized)  ONSET DATE: pain since October 2024   SUBJECTIVE:  SUBJECTIVE STATEMENT: She relays shoulder is still bothering her but has not done the exercises yet. She did feel like the ionto patch helped some   PERTINENT HISTORY:  Anxiety, depression, OA, HTN  PAIN:  NPRS scale: 8/10 upon arrival Pain location:left lateral shoulder Pain description: constant, achy, sharp Aggravating factors:  Relieving factors: rest, meds   PRECAUTIONS:  None  RED FLAGS: None   WEIGHT BEARING RESTRICTIONS:  No  FALLS:  Has patient fallen in last 6 months? No  OCCUPATION:  Dialysis tech  PLOF:  Independent  PATIENT GOALS:  Reduce pain   OBJECTIVE:  Note: Objective measures were completed at Evaluation unless otherwise noted.  DIAGNOSTIC FINDINGS:  MRI IMPRESSION: 1. Moderate anterior greater than mid AP dimension of the supraspinatus tendinosis with probable tiny, thin, linear 4 x 1 mm longitudinal midsubstance tear within the anterior supraspinatus tendon footprint. No tendon retraction. 2. Two adjacent longitudinal linear fluid bright interstitial tears within the anterior infraspinatus tendon insertion measuring only 1 mm in thickness and approximately 10 mm in length. No  tendon retraction. 3. There is a 10 mm oval well corticated ossicle within the midsubstance of the mid height of the subscapularis tendon insertion. This likely reflects the sequela of a remote partial-thickness tear. 4. Mild proximal long head of the biceps tendinosis. 5. Moderate degenerative changes of the acromioclavicular joint. 6. Moderate glenohumeral cartilage thinning. Mild glenohumeral joint effusion with numerous small low signal foci, possible small loose bodies versus focal synovitis.  PATIENT SURVEYS:  07/13/23  Patient-specific activity scoring scheme   "0" represents "unable to perform." "10" represents "able to perform at prior level. 0 1 2 3 4 5 6 7 8 9  10 (Date and Score) Activity Initial  Activity Eval     Any movement of left shoulder 2    2.   Lifting arm over head  2    3.   Laying on left side 2            Total score = sum of the activity scores/number of activities Minimum detectable change (90%CI) for average score = 2 points Minimum detectable change (90%CI) for single activity score = 3 points   Score: 6/30, projected goal >15/10   COGNITIVE STATUS: Within functional limits for tasks assessed   SENSATION: WFL   HAND DOMINANCE:  Left   PALPATION: Eval: TTP left RTC insertion and lateral deltoid   UPPER EXTREMITY ROM:  ROM Right eval Left eval  Shoulder flexion A: 105   Shoulder extension    Shoulder abduction A:105   Shoulder adduction    Shoulder extension    Shoulder internal rotation L4   Shoulder external rotation Occiput   Elbow flexion    Elbow extension    Wrist flexion    Wrist extension    Wrist ulnar deviation    Wrist radial deviation    Wrist pronation    Wrist supination     (Blank rows = not tested)   UPPER EXTREMITY MMT:  MMT Right eval Left eval  Shoulder flexion 4   Shoulder extension    Shoulder abduction 4   Shoulder adduction    Shoulder extension 4   Shoulder internal rotation 4    Shoulder external rotation 4   Middle trapezius    Lower trapezius    Elbow flexion    Elbow extension    Wrist flexion    Wrist extension    Wrist ulnar deviation    Wrist radial deviation  Wrist pronation    Wrist supination    Grip strength     (Blank rows = not tested)  TREATMENT:                                                                                                                              DATE:  07/29/23 UBE L1 X 5 min forward, attempted to do backwards but increased pain so stopped Pulleys 2 min flexion, 2 min abduction Supine wand flexion AAROM X 10 Supine wand abd AAROM X 10 Supine wand ER AAROM X 10 Standing rows red X15 Standing shoulder extensions red X 15 Shoulder ER red X 15  Trigger Point Dry Needling (2 minutes total and not included in billable time) Initial Treatment: Pt instructed on Dry Needling rational, procedures, and possible side effects. Pt instructed to expect mild to moderate muscle soreness later in the day and/or into the next day.  Pt instructed to continue prescribed HEP. Patient verbalized understanding of these instructions and education.  Patient Verbal Consent Given: Yes Education Handout Provided: Verbally provided Muscles Treated: Left shoulder deltoids and RTC insertion Electrical Stimulation Performed: No Treatment Response/Outcome:  good overall tolerance,twitch response noted   Iontophoresis 2nd application 4-6 hour wear home patch with 1.0CC dexamethasone and saline to left lateral shoulder at RTC insertion. Verbal instructions and education provided.  DATE:  07/20/23 HEP creation and review with demonstration, cues, and instructions not to push into pain. See below for details on specific exercises  Iontophoresis 4-6 hour wear home patch with 1.0CC dexamethasone and saline to left lateral shoulder at RTC insertion. Verbal instructions and education provided.      PATIENT EDUCATION:  Education details: HEP, MRI  results, PT plan of care Person educated: Patient Education method: Explanation, Demonstration, Verbal cues, and Handouts Education comprehension: verbalized understanding and needs further education  HOME EXERCISE PROGRAM: Access Code: [email or web address] URL: [email or web address] Date: 07/20/2023 Prepared by: [name]  Exercises - Supine Shoulder Flexion Extension AAROM with Dowel  - 2 x daily - 6 x weekly - 1 sets - 10 reps - Supine Shoulder Abduction AAROM with Dowel  - 2 x daily - 6 x weekly - 1 sets - 10 reps - Supine Shoulder External Rotation with Dowel  - 2 x daily - 6 x weekly - 1 sets - 10 reps - Standing Shoulder Row with Anchored Resistance  - 2 x daily - 6 x weekly - 2 sets - 10 reps - Shoulder extension with resistance - Neutral  - 2 x daily - 6 x weekly - 2 sets - 10 reps - Shoulder Internal Rotation with Resistance (Mirrored)  - 2 x daily - 6 x weekly - 2 sets - 10 reps - Shoulder External Rotation with Anchored Resistance  - 2 x daily - 6 x weekly - 2 sets - 10 reps   ASSESSMENT:  CLINICAL IMPRESSION: She was still having a lot of shoulder pain so I did  try DN to this today to see if this will help and again used into as she feels that helped some last time. I did review HEP with her and encouraged compliance with it to give the exercises a chance to see if they will be helpful.   OBJECTIVE IMPAIRMENTS: decreased knowledge of condition, decreased ROM, decreased strength, impaired flexibility, and pain.   ACTIVITY LIMITATIONS: carrying, lifting, sleeping, dressing, and reach over head  PARTICIPATION LIMITATIONS: meal prep, cleaning, laundry, driving, and occupation  PERSONAL FACTORS: 3+ comorbidities: Anxiety, depression, OA, HTN  are also affecting patient's functional outcome.   REHAB POTENTIAL: Good  CLINICAL DECISION MAKING: Stable/uncomplicated  EVALUATION COMPLEXITY: Low   GOALS: Goals reviewed with patient? Yes  SHORT TERM GOALS: Target  date: 08/17/2023    Independent with initial HEP Goal status: INITIAL  2.  Patient will reduce pain to less than 6/10 from 8/10 Goal status: INITIAL    LONG TERM GOALS: Target date: 09/14/2023    Independent with final HEP Goal status: INITIAL  2.  PSFS improved from 6/30 to >15/30 to show improved function. Goal status: INITIAL  3.  UE strength improved to 4+/5 to improve function Goal status: INIITAL  4.  Report pain < 4/10 with normal activity for improved function Goal status: INITIAL  5.  ROM in left shoulder improved to [facility] at least 140 deg AROM for flexion and scaption to improve overhead reaching Goal status: INITIAL   PLAN:  PT FREQUENCY: 1x/week  PT DURATION: 8 weeks  PLANNED INTERVENTIONS: 97110-Therapeutic exercises, 97530- Therapeutic activity, [id number]- Neuromuscular re-education, 97535- Self Care, 40981- Manual therapy, G0283- Electrical stimulation (unattended), 97035- Ultrasound, 19147- Ionotophoresis 4mg /ml Dexamethasone, Dry Needling, and Joint mobilization.  PLAN FOR NEXT SESSION: how was DN? Gentle strength and ROM as tolerated to improve function and reduce pain.   NEXT MD VISIT: PRN   [name], PT, DPT 07/29/23 3:50 PM

## 2023-08-03 ENCOUNTER — Telehealth: Payer: Self-pay | Admitting: Orthopaedic Surgery

## 2023-08-03 ENCOUNTER — Other Ambulatory Visit: Payer: Self-pay | Admitting: Family Medicine

## 2023-08-03 NOTE — Telephone Encounter (Signed)
 Received call from patient. States Vanessa [name] needs her PT notes. I faxed last 2 PT notes [phone]

## 2023-08-05 ENCOUNTER — Telehealth: Payer: Self-pay | Admitting: Family Medicine

## 2023-08-05 ENCOUNTER — Ambulatory Visit (INDEPENDENT_AMBULATORY_CARE_PROVIDER_SITE_OTHER): Payer: 59 | Admitting: Physical Therapy

## 2023-08-05 ENCOUNTER — Other Ambulatory Visit: Payer: Self-pay | Admitting: Family Medicine

## 2023-08-05 ENCOUNTER — Encounter: Payer: Self-pay | Admitting: Physical Therapy

## 2023-08-05 DIAGNOSIS — M25512 Pain in left shoulder: Secondary | ICD-10-CM | POA: Diagnosis not present

## 2023-08-05 DIAGNOSIS — M6281 Muscle weakness (generalized): Secondary | ICD-10-CM | POA: Diagnosis not present

## 2023-08-05 DIAGNOSIS — G8929 Other chronic pain: Secondary | ICD-10-CM

## 2023-08-05 NOTE — Telephone Encounter (Signed)
 Copied from CRM [id number]. Topic: Clinical - Prescription Issue >> Aug 05, 2023 11:14 AM Eunice Blase wrote: Reason for CRM: Pt called stated that venlafaxine XR (EFFEXOR-XR) 150 mg was denied. I see a 75 mg but not a 150 mg. Please call pt [phone] and pharmacy for clarification.

## 2023-08-05 NOTE — Therapy (Addendum)
 OUTPATIENT PHYSICAL THERAPY TREATMENT/Discharge addendum PHYSICAL THERAPY DISCHARGE SUMMARY  Visits from Start of Care: 3  Current functional level related to goals / functional outcomes: See below   Remaining deficits: See below   Education / Equipment: HEP  Plan:  Patient goals were not met. Patient is being discharged as MD is recommending surgery for her shoulder. Vanessa [name], PT, DPT 08/12/23 10:34 AM       Patient Name: Vanessa [name] MRN: [id number] DOB:April 14, 1961, 63 y.o., female Today's Date: 08/05/2023  END OF SESSION:  PT End of Session - 08/05/23 1342     Visit Number 3    Number of Visits 8    Date for PT Re-Evaluation 09/14/23    PT Start Time 1309    PT Stop Time 1345    PT Time Calculation (min) 36 min    Activity Tolerance Patient tolerated treatment well;Patient limited by pain    Behavior During Therapy [facility] for tasks assessed/performed              Past Medical History:  Diagnosis Date   Anxiety    Arthritis    Chronic knee pain    Depression    Diverticulosis    Gall stone    Hepatitis C    Hx of Hepatitis C   Hypertension    Syphilis    Past Surgical History:  Procedure Laterality Date   ANAL RECTAL MANOMETRY N/A 02/20/2019   Procedure: ANO RECTAL MANOMETRY;  Surgeon: Vanessa Fee, MD;  [facility]: [facility];  Service: Endoscopy;  Laterality: N/A;   CARPAL TUNNEL RELEASE Left    CHOLECYSTECTOMY N/A 11/24/2014   Procedure: LAPAROSCOPIC CHOLECYSTECTOMY WITH INTRAOPERATIVE CHOLANGIOGRAM;  Surgeon: Vanessa Adu, MD;  Location: [facility];  Service: General;  Laterality: N/A;   ECTOPIC PREGNANCY SURGERY     TOTAL KNEE ARTHROPLASTY Right 03/15/2020   Procedure: TOTAL KNEE ARTHROPLASTY;  Surgeon: Vanessa Geralds, MD;  Location: [facility];  Service: Orthopedics;  Laterality: Right;   Patient Active Problem List   Diagnosis Date Noted   Primary osteoarthritis of right knee 03/15/2020   Status post total knee replacement, right 03/15/2020    Incontinence of feces    Morbid obesity (HCC) 07/12/2018   Allergic rhinitis 07/22/2017   Tear of medial meniscus of right knee, current 03/03/2017   Tear of lateral meniscus of right knee, current 03/03/2017   Vitamin D deficiency 12/04/2016   Baker's cyst of knee, right 05/21/2016   Edema 12/03/2015   Liver fibrosis 07/25/2015   S/P laparoscopic cholecystectomy 11/25/2014   Knee pain, right 07/17/2014   Physical exam 07/17/2014   Pap smear for cervical cancer screening 07/17/2014   Hyperlipidemia 01/12/2014   History of hepatitis C 04/27/2013   Substance abuse in remission (HCC) 04/27/2013   Hypertension    Depression     PCP: Vanessa Hatch, MD  REFERRING PROVIDER: Tarry Kos, MD  REFERRING DIAG: [phone] (ICD-10-CM) - Chronic left shoulder pain  Rationale for Evaluation and Treatment: Rehabilitation  THERAPY DIAG:  Chronic left shoulder pain  Muscle weakness (generalized)  ONSET DATE: pain since October 2024   SUBJECTIVE:  SUBJECTIVE STATEMENT: She feels like the pain is the same, no significant long term relief with the pain in her shoulder  PERTINENT HISTORY:  Anxiety, depression, OA, HTN  PAIN:  NPRS scale: 8/10 upon arrival Pain location:left lateral shoulder Pain description: constant, achy, sharp Aggravating factors:  Relieving factors: rest, meds   PRECAUTIONS:  None  RED FLAGS: None   WEIGHT BEARING RESTRICTIONS:  No  FALLS:  Has patient fallen in last 6 months? No  OCCUPATION:  Dialysis tech  PLOF:  Independent  PATIENT GOALS:  Reduce pain   OBJECTIVE:  Note: Objective measures were completed at Evaluation unless otherwise noted.  DIAGNOSTIC FINDINGS:  MRI IMPRESSION: 1. Moderate anterior greater than mid AP dimension of  the supraspinatus tendinosis with probable tiny, thin, linear 4 x 1 mm longitudinal midsubstance tear within the anterior supraspinatus tendon footprint. No tendon retraction. 2. Two adjacent longitudinal linear fluid bright interstitial tears within the anterior infraspinatus tendon insertion measuring only 1 mm in thickness and approximately 10 mm in length. No tendon retraction. 3. There is a 10 mm oval well corticated ossicle within the midsubstance of the mid height of the subscapularis tendon insertion. This likely reflects the sequela of a remote partial-thickness tear. 4. Mild proximal long head of the biceps tendinosis. 5. Moderate degenerative changes of the acromioclavicular joint. 6. Moderate glenohumeral cartilage thinning. Mild glenohumeral joint effusion with numerous small low signal foci, possible small loose bodies versus focal synovitis.  PATIENT SURVEYS:  07/13/23  Patient-specific activity scoring scheme   "0" represents "unable to perform." "10" represents "able to perform at prior level. 0 1 2 3 4 5 6 7 8 9  10 (Date and Score) Activity Initial  Activity Eval     Any movement of left shoulder 2    2.   Lifting arm over head  2    3.   Laying on left side 2            Total score = sum of the activity scores/number of activities Minimum detectable change (90%CI) for average score = 2 points Minimum detectable change (90%CI) for single activity score = 3 points   Score: 6/30, projected goal >15/10   COGNITIVE STATUS: Within functional limits for tasks assessed   SENSATION: WFL   HAND DOMINANCE:  Left   PALPATION: Eval: TTP left RTC insertion and lateral deltoid   UPPER EXTREMITY ROM:  ROM Right eval Left eval  Shoulder flexion A: 105   Shoulder extension    Shoulder abduction A:105   Shoulder adduction    Shoulder extension    Shoulder internal rotation L4   Shoulder external rotation Occiput   Elbow flexion    Elbow extension     Wrist flexion    Wrist extension    Wrist ulnar deviation    Wrist radial deviation    Wrist pronation    Wrist supination     (Blank rows = not tested)   UPPER EXTREMITY MMT:  MMT Right eval Left eval  Shoulder flexion 4   Shoulder extension    Shoulder abduction 4   Shoulder adduction    Shoulder extension 4   Shoulder internal rotation 4   Shoulder external rotation 4   Middle trapezius    Lower trapezius    Elbow flexion    Elbow extension    Wrist flexion    Wrist extension    Wrist ulnar deviation    Wrist radial deviation    Wrist pronation  Wrist supination    Grip strength     (Blank rows = not tested)  TREATMENT:                                                                                                                              DATE:  08/05/23 Tableslides 5 sec X 10 flexion Tableslides 5 sec X 10 abduction Tableslides 5 sec X 10 ER KT tape to left shoulder for support, verbal instructions and education about this and where to buy for home if helpful  Trigger Point Dry Needling (2 minutes total and not included in billable time) Initial Treatment: Pt instructed on Dry Needling rational, procedures, and possible side effects. Pt instructed to expect mild to moderate muscle soreness later in the day and/or into the next day.  Pt instructed to continue prescribed HEP. Patient verbalized understanding of these instructions and education.  Patient Verbal Consent Given: Yes Education Handout Provided: Verbally provided Muscles Treated: Left shoulder deltoids and RTC insertion Electrical Stimulation Performed: YES, used micro current at high frequency as well as mili amp current at low frequency with intensity turned up to tolerance Treatment Response/Outcome:  good overall tolerance,twitch response noted   07/29/23 UBE L1 X 5 min forward, attempted to do backwards but increased pain so stopped Pulleys 2 min flexion, 2 min abduction Supine wand  flexion AAROM X 10 Supine wand abd AAROM X 10 Supine wand ER AAROM X 10 Standing rows red X15 Standing shoulder extensions red X 15 Shoulder ER red X 15  Trigger Point Dry Needling (2 minutes total and not included in billable time) Initial Treatment: Pt instructed on Dry Needling rational, procedures, and possible side effects. Pt instructed to expect mild to moderate muscle soreness later in the day and/or into the next day.  Pt instructed to continue prescribed HEP. Patient verbalized understanding of these instructions and education.  Patient Verbal Consent Given: Yes Education Handout Provided: Verbally provided Muscles Treated: Left shoulder deltoids and RTC insertion Electrical Stimulation Performed: No Treatment Response/Outcome:  good overall tolerance,twitch response noted   Iontophoresis 2nd application 4-6 hour wear home patch with 1.0CC dexamethasone and saline to left lateral shoulder at RTC insertion. Verbal instructions and education provided.   PATIENT EDUCATION:  Education details: HEP, MRI results, PT plan of care Person educated: Patient Education method: Explanation, Demonstration, Verbal cues, and Handouts Education comprehension: verbalized understanding and needs further education  HOME EXERCISE PROGRAM: Access Code: [email or web address] URL: [email or web address] Date: 07/20/2023 Prepared by: Vanessa [name]  Exercises - Supine Shoulder Flexion Extension AAROM with Dowel  - 2 x daily - 6 x weekly - 1 sets - 10 reps - Supine Shoulder Abduction AAROM with Dowel  - 2 x daily - 6 x weekly - 1 sets - 10 reps - Supine Shoulder External Rotation with Dowel  - 2 x daily - 6 x weekly - 1 sets - 10 reps - Standing Shoulder Row with Anchored Resistance  -  2 x daily - 6 x weekly - 2 sets - 10 reps - Shoulder extension with resistance - Neutral  - 2 x daily - 6 x weekly - 2 sets - 10 reps - Shoulder Internal Rotation with Resistance (Mirrored)  - 2 x daily - 6 x  weekly - 2 sets - 10 reps - Shoulder External Rotation with Anchored Resistance  - 2 x daily - 6 x weekly - 2 sets - 10 reps   ASSESSMENT:  CLINICAL IMPRESSION: She is still in a good deal of pain, has not yet had much relief from PT. I did try electrical stimulation today with the DN as well as KT tape for her shoulder to see if this will help any. She will follow up with MD tommorow about her shoulder and if no improvement she does not need to come back to PT  OBJECTIVE IMPAIRMENTS: decreased knowledge of condition, decreased ROM, decreased strength, impaired flexibility, and pain.   ACTIVITY LIMITATIONS: carrying, lifting, sleeping, dressing, and reach over head  PARTICIPATION LIMITATIONS: meal prep, cleaning, laundry, driving, and occupation  PERSONAL FACTORS: 3+ comorbidities: Anxiety, depression, OA, HTN  are also affecting patient's functional outcome.   REHAB POTENTIAL: Good  CLINICAL DECISION MAKING: Stable/uncomplicated  EVALUATION COMPLEXITY: Low   GOALS: Goals reviewed with patient? Yes  SHORT TERM GOALS: Target date: 08/17/2023    Independent with initial HEP Goal status: INITIAL  2.  Patient will reduce pain to less than 6/10 from 8/10 Goal status: INITIAL    LONG TERM GOALS: Target date: 09/14/2023    Independent with final HEP Goal status: INITIAL  2.  PSFS improved from 6/30 to >15/30 to show improved function. Goal status: INITIAL  3.  UE strength improved to 4+/5 to improve function Goal status: INIITAL  4.  Report pain < 4/10 with normal activity for improved function Goal status: INITIAL  5.  ROM in left shoulder improved to [facility] at least 140 deg AROM for flexion and scaption to improve overhead reaching Goal status: INITIAL   PLAN:  PT FREQUENCY: 1x/week  PT DURATION: 8 weeks  PLANNED INTERVENTIONS: 97110-Therapeutic exercises, 97530- Therapeutic activity, [id number]- Neuromuscular re-education, 97535- Self Care, 40981- Manual therapy,  G0283- Electrical stimulation (unattended), 97035- Ultrasound, 19147- Ionotophoresis 4mg /ml Dexamethasone, Dry Needling, and Joint mobilization.  PLAN FOR NEXT SESSION: if no improvement she does not need to come back to PT  NEXT MD VISIT: PRN   Vanessa [name], PT, DPT 08/05/23 1:43 PM

## 2023-08-05 NOTE — Telephone Encounter (Signed)
 Patient called, no answer, mailbox is full. Venlafaxine 75 mg is on her current list, so will need clarification on what she needs to be refilled. It was last refilled on 06/02/23 #90/1 refill, so that's why the venlafaxine 150 mg was denied on 08/05/23. Will try call again later.

## 2023-08-06 ENCOUNTER — Encounter: Payer: Self-pay | Admitting: Orthopaedic Surgery

## 2023-08-06 ENCOUNTER — Ambulatory Visit: Payer: 59 | Admitting: Orthopaedic Surgery

## 2023-08-06 DIAGNOSIS — M7542 Impingement syndrome of left shoulder: Secondary | ICD-10-CM

## 2023-08-06 DIAGNOSIS — M75112 Incomplete rotator cuff tear or rupture of left shoulder, not specified as traumatic: Secondary | ICD-10-CM

## 2023-08-06 NOTE — Progress Notes (Signed)
 Office Visit Note   Patient: Vanessa [name]           Date of Birth: [birth year]           MRN: [id number] Visit Date: 08/06/2023              Requested by: Sheliah Hatch, MD [address] [place],  [place] [address] PCP: Sheliah Hatch, MD   Assessment & Plan: Visit Diagnoses:  1. Partial nontraumatic tear of left rotator cuff   2. Impingement syndrome of left shoulder     Plan: Darl Pikes is a 63 year old female with chronic left shoulder pain.  She has had a glenohumeral injection and a subacromial injection which each only helped for about 3 days.  MRI findings independently interpreted and reviewed again with the patient which basically shows diffuse degenerative changes.  Based on lack of relief from nonsurgical treatments I have recommended arthroscopic surgery.  Risk benefits prognosis reviewed.  We will keep her out of work until surgery and beyond based on her recovery.  We will coordinate with her pain clinic for postop pain meds.  Total face to face encounter time was greater than 25 minutes and over half of this time was spent in counseling and/or coordination of care.  Follow-Up Instructions: No follow-ups on file.   Orders:  No orders of the defined types were placed in this encounter.  No orders of the defined types were placed in this encounter.     Procedures: No procedures performed   Clinical Data: No additional findings.   Subjective: Chief Complaint  Patient presents with   Left Shoulder - Follow-up    HPI Vanessa [name] returns today for follow-up evaluation of chronic left shoulder pain. Review of Systems  Constitutional: Negative.   HENT: Negative.    Eyes: Negative.   Respiratory: Negative.    Cardiovascular: Negative.   Endocrine: Negative.   Musculoskeletal: Negative.   Neurological: Negative.   Hematological: Negative.   Psychiatric/Behavioral: Negative.    All other systems reviewed and are negative.    Objective: Vital  Signs: There were no vitals taken for this visit.  Physical Exam Vitals and nursing note reviewed.  Constitutional:      Appearance: She is well-developed.  HENT:     Head: Atraumatic.     Nose: Nose normal.  Eyes:     Extraocular Movements: Extraocular movements intact.  Cardiovascular:     Pulses: Normal pulses.  Pulmonary:     Effort: Pulmonary effort is normal.  Abdominal:     Palpations: Abdomen is soft.  Musculoskeletal:     Cervical back: Neck supple.  Skin:    General: Skin is warm.     Capillary Refill: Capillary refill takes less than 2 seconds.  Neurological:     Mental Status: She is alert. Mental status is at baseline.  Psychiatric:        Behavior: Behavior normal.        Thought Content: Thought content normal.        Judgment: Judgment normal.     Ortho Exam Examination of the left shoulder is unchanged from prior visit. Specialty Comments:  No specialty comments available.  Imaging: No results found.   PMFS History: Patient Active Problem List   Diagnosis Date Noted   Partial nontraumatic tear of left rotator cuff 08/06/2023   Impingement syndrome of left shoulder 08/06/2023   Primary osteoarthritis of right knee 03/15/2020   Status post total knee replacement, right  03/15/2020   Incontinence of feces    Morbid obesity (HCC) 07/12/2018   Allergic rhinitis 07/22/2017   Tear of medial meniscus of right knee, current 03/03/2017   Tear of lateral meniscus of right knee, current 03/03/2017   Vitamin D deficiency 12/04/2016   Baker's cyst of knee, right 05/21/2016   Edema 12/03/2015   Liver fibrosis 07/25/2015   S/P laparoscopic cholecystectomy 11/25/2014   Knee pain, right 07/17/2014   Physical exam 07/17/2014   Pap smear for cervical cancer screening 07/17/2014   Hyperlipidemia 01/12/2014   History of hepatitis C 04/27/2013   Substance abuse in remission (HCC) 04/27/2013   Hypertension    Depression    Past Medical History:  Diagnosis  Date   Anxiety    Arthritis    Chronic knee pain    Depression    Diverticulosis    Gall stone    Hepatitis C    Hx of Hepatitis C   Hypertension    Syphilis     Family History  Problem Relation Age of Onset   Hypertension Mother    Diabetes Mother    Drug abuse Brother    Esophageal cancer Brother 48   Colon cancer Brother 76   Hypertension Sister        x's 2   Diabetes Sister        oldest sister   Stomach cancer Neg Hx    Breast cancer Neg Hx    Rectal cancer Neg Hx     Past Surgical History:  Procedure Laterality Date   ANAL RECTAL MANOMETRY N/A 02/20/2019   Procedure: ANO RECTAL MANOMETRY;  Surgeon: Rachael Fee, MD;  [facility];  Service: Endoscopy;  Laterality: N/A;   CARPAL TUNNEL RELEASE Left    CHOLECYSTECTOMY N/A 11/24/2014   Procedure: LAPAROSCOPIC CHOLECYSTECTOMY WITH INTRAOPERATIVE CHOLANGIOGRAM;  Surgeon: Gaynelle Adu, MD;  Location[facility];  Service: General;  Laterality: N/A;   ECTOPIC PREGNANCY SURGERY     TOTAL KNEE ARTHROPLASTY Right 03/15/2020   Procedure: TOTAL KNEE ARTHROPLASTY;  Surgeon: Jodi Geralds, MD;  Location: [facility];  Service: Orthopedics;  Laterality: Right;   Social History   Occupational History   Occupation: Dialysis Tech  Tobacco Use   Smoking status: Every Day    Current packs/day: 0.00    Average packs/day: 1 pack/day for 35.0 years (35.0 ttl pk-yrs)    Types: E-cigarettes, Cigarettes    Start date: 05/25/1994    Last attempt to quit: 03/03/2016    Years since quitting: 7.4   Smokeless tobacco: Never  Vaping Use   Vaping status: Former   Substances: Nicotine, Flavoring  Substance and Sexual Activity   Alcohol use: No    Alcohol/week: 0.0 standard drinks of alcohol    Comment: "last alcohol was in the 1990's"   Drug use: Not Currently    Types: Marijuana, "Crack" cocaine    Comment: "last drug use was in the 1990's; never used heroin"   Sexual activity: Not Currently    Birth control/protection:  Post-menopausal

## 2023-08-06 NOTE — Telephone Encounter (Signed)
Called pt and unable to leave vm

## 2023-08-10 NOTE — Telephone Encounter (Signed)
Called patient and unable to leave VM.

## 2023-08-11 ENCOUNTER — Other Ambulatory Visit: Payer: Self-pay | Admitting: Family Medicine

## 2023-08-11 DIAGNOSIS — F331 Major depressive disorder, recurrent, moderate: Secondary | ICD-10-CM

## 2023-08-11 NOTE — Telephone Encounter (Signed)
 Copied from CRM [id number]. Topic: Clinical - Medication Refill >> Aug 11, 2023  2:47 PM Kathryne Eriksson wrote: Most Recent Primary Care Visit:  Provider: Sheliah Hatch  Department: [facility]  Visit Type: OFFICE VISIT  Date: 03/04/2023  Medication: venlafaxine XR (EFFEXOR-XR) 75 MG 24 hr capsule  Has the patient contacted their pharmacy? Yes (Agent: If no, request that the patient contact the pharmacy for the refill. If patient does not wish to contact the pharmacy document the reason why and proceed with request.) (Agent: If yes, when and what did the pharmacy advise?)  Is this the correct pharmacy for this prescription? Yes If no, delete pharmacy and type the correct one.  This is the patient's preferred pharmacy:  [id number] - [place], [place] - [address] AT [address] OF [facility] [address] [place] [address] Phone: [phone] Fax: [phone]    Has the prescription been filled recently? No  Is the patient out of the medication? Yes  Has the patient been seen for an appointment in the last year OR does the patient have an upcoming appointment? Yes  Can we respond through MyChart? Yes  Agent: Please be advised that Rx refills may take up to 3 business days. We ask that you follow-up with your pharmacy.

## 2023-08-11 NOTE — Telephone Encounter (Signed)
 Letter out. Left vm

## 2023-08-12 ENCOUNTER — Encounter: Payer: 59 | Admitting: Physical Therapy

## 2023-08-12 ENCOUNTER — Telehealth: Payer: Self-pay | Admitting: Orthopaedic Surgery

## 2023-08-12 NOTE — Telephone Encounter (Signed)
 Received call from patient stating needs most recent medical records faxed to [facility]. I faxed PT note & 08/06/23 notes to [phone]

## 2023-08-13 ENCOUNTER — Other Ambulatory Visit: Payer: Self-pay | Admitting: Family Medicine

## 2023-08-16 NOTE — Telephone Encounter (Signed)
 Requested Prescriptions   Pending Prescriptions Disp Refills   ALPRAZolam (XANAX) 0.5 MG tablet [Pharmacy Med Name: ALPRAZOLAM 0.5 MG TABLET] 60 tablet 0    Sig: TAKE 1 TABLET BY MOUTH TWICE A DAY AS NEEDED FOR ANXIETY     Date of patient request: 08/16/2023 Last office visit: 03/04/2023 Upcoming visit: Visit date not found Date of last refill: 06/02/2023 Last refill amount: 60

## 2023-08-19 ENCOUNTER — Encounter: Payer: 59 | Admitting: Physical Therapy

## 2023-08-26 ENCOUNTER — Other Ambulatory Visit: Payer: Self-pay | Admitting: Family Medicine

## 2023-08-26 ENCOUNTER — Encounter: Payer: 59 | Admitting: Physical Therapy

## 2023-08-26 ENCOUNTER — Telehealth: Payer: Self-pay

## 2023-08-26 DIAGNOSIS — F331 Major depressive disorder, recurrent, moderate: Secondary | ICD-10-CM

## 2023-08-26 MED ORDER — VENLAFAXINE HCL ER 75 MG PO CP24: 75.0000 mg | ORAL_CAPSULE | Freq: Every day | ORAL | 0 refills | Status: DC

## 2023-08-26 MED ORDER — VENLAFAXINE HCL ER 150 MG PO CP24: 150.0000 mg | ORAL_CAPSULE | Freq: Every day | ORAL | 0 refills | Status: DC

## 2023-08-26 NOTE — Telephone Encounter (Signed)
 Copied from CRM [id number]. Topic: Clinical - Medication Refill >> Aug 26, 2023  9:31 AM Truddie Crumble wrote: Most Recent Primary Care Visit:  Provider: Sheliah Hatch  Department: [facility]  Visit Type: OFFICE VISIT  Date: 03/04/2023  Medication: venlafaxine XR (EFFEXOR-XR) 75 MG 24 hr capsule  (patient would like the doctor to call her regarding a letter that she received)  Has the patient contacted their pharmacy? Yes (Agent: If no, request that the patient contact the pharmacy for the refill. If patient does not wish to contact the pharmacy document the reason why and proceed with request.) (Agent: If yes, when and what did the pharmacy advise?)  Is this the correct pharmacy for this prescription? Yes If no, delete pharmacy and type the correct one.  This is the patient's preferred pharmacy:  [id number] - [place],  - [address] [facility] [address] [place] [address] Phone: [phone] Fax: [phone]  Has the prescription been filled recently? No  Is the patient out of the medication? Yes  Has the patient been seen for an appointment in the last year OR does the patient have an upcoming appointment? Yes  Can we respond through MyChart? Yes  Agent: Please be advised that Rx refills may take up to 3 business days. We ask that you follow-up with your pharmacy.

## 2023-08-26 NOTE — Telephone Encounter (Signed)
 Pt is asking for her Effexor however she notes she should have 2 doses both a 75 mg and a 150 mg and notes she hasn't been able to get both for about 3 weeks now. Can you clarify what she should be taking currently and I can set up refills per those directions   I see a not from her 07/09/2023 pt wasn't taking it but now she's stating she didn't stop.   Please advise

## 2023-08-26 NOTE — Telephone Encounter (Signed)
 Copied from CRM [id number]. Topic: General - Other >> Aug 26, 2023  1:35 PM Rodman Pickle T wrote: Reason for CRM: patient is calling in needing to speak with dr Beverely Low nurse she would like a call back

## 2023-08-26 NOTE — Telephone Encounter (Signed)
 It appears that the ER d/c'd her med on 2/14.  She has been taking BOTH a 75mg  and a 150mg  Effexor daily for a TOTAL of 225mg  daily.  Ok to provide 90 of each

## 2023-08-26 NOTE — Addendum Note (Signed)
 Addended by: Cordella Register on: 08/26/2023 04:21 PM   Modules accepted: Orders

## 2023-08-26 NOTE — Telephone Encounter (Signed)
 Called patient to let her know medication has been sent over to pharmacy.

## 2023-09-03 ENCOUNTER — Telehealth: Payer: Self-pay

## 2023-09-03 DIAGNOSIS — M7542 Impingement syndrome of left shoulder: Secondary | ICD-10-CM

## 2023-09-03 DIAGNOSIS — M75112 Incomplete rotator cuff tear or rupture of left shoulder, not specified as traumatic: Secondary | ICD-10-CM

## 2023-09-03 DIAGNOSIS — G8929 Other chronic pain: Secondary | ICD-10-CM

## 2023-09-03 DIAGNOSIS — M19012 Primary osteoarthritis, left shoulder: Secondary | ICD-10-CM

## 2023-09-03 NOTE — Telephone Encounter (Signed)
 Patient stopped into office asking for note for work.  She has decided to hold off on her surgery right now and wants to continue working.  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  She was asking for a note for work with restrictions.  She is asking for note stating that she cannot work 3 consecutive days. Note provided to patient stating this.  Patient also does want to continue with physical therapy and asked for a new order for PT to be placed b/c previous referral closed when patient was going to have surgery. New referral placed as well.  Just sending as FYI. Dr Roda Shutters please advise if anything should be done different for patient while she holds on proceeding with surgery.

## 2023-09-06 NOTE — Telephone Encounter (Signed)
 Thanks

## 2023-09-23 ENCOUNTER — Ambulatory Visit: Admitting: Rehabilitative and Restorative Service Providers"

## 2023-10-07 ENCOUNTER — Ambulatory Visit: Admitting: Family Medicine

## 2023-10-11 ENCOUNTER — Telehealth: Payer: Self-pay | Admitting: Orthopaedic Surgery

## 2023-10-11 NOTE — Telephone Encounter (Signed)
 Unable to reach patient at [phone] to schedule left shoulder surgery with Dr. Christiane Cowing.  Patient's voicemail has not been set up.

## 2023-10-14 ENCOUNTER — Ambulatory Visit: Admitting: Family Medicine

## 2023-10-21 ENCOUNTER — Other Ambulatory Visit: Payer: Self-pay | Admitting: Family Medicine

## 2023-10-21 NOTE — Telephone Encounter (Signed)
 Requested Prescriptions   Pending Prescriptions Disp Refills   ALPRAZolam  (XANAX ) 0.5 MG tablet [Pharmacy Med Name: ALPRAZOLAM  0.5 MG TABLET] 60 tablet 0    Sig: TAKE 1 TABLET BY MOUTH TWICE A DAY AS NEEDED FOR ANXIETY     Date of patient request: 10/21/2023 Last office visit: 03/04/2023 Upcoming visit: Visit date not found Date of last refill: 08/16/2023 Last refill amount: 60

## 2023-10-22 ENCOUNTER — Ambulatory Visit (HOSPITAL_BASED_OUTPATIENT_CLINIC_OR_DEPARTMENT_OTHER): Admitting: Student

## 2023-10-28 ENCOUNTER — Ambulatory Visit: Admitting: Physical Therapy

## 2023-10-28 NOTE — Therapy (Deleted)
 OUTPATIENT PHYSICAL THERAPY SHOULDER EVALUATION   Patient Name: Vanessa [name] MRN: [id number] DOB:1960/10/02, 63 y.o., female Today's Date: 10/28/2023  END OF SESSION:   Past Medical History:  Diagnosis Date   Anxiety    Arthritis    Chronic knee pain    Depression    Diverticulosis    Gall stone    Hepatitis C    Hx of Hepatitis C   Hypertension    Syphilis    Past Surgical History:  Procedure Laterality Date   ANAL RECTAL MANOMETRY N/A 02/20/2019   Procedure: ANO RECTAL MANOMETRY;  Surgeon: Janel Medford, MD;  Location: [facility];  Service: Endoscopy;  Laterality: N/A;   CARPAL TUNNEL RELEASE Left    CHOLECYSTECTOMY N/A 11/24/2014   Procedure: LAPAROSCOPIC CHOLECYSTECTOMY WITH INTRAOPERATIVE CHOLANGIOGRAM;  Surgeon: Aldean Hummingbird, MD;  Location: [facility];  Service: General;  Laterality: N/A;   ECTOPIC PREGNANCY SURGERY     TOTAL KNEE ARTHROPLASTY Right 03/15/2020   Procedure: TOTAL KNEE ARTHROPLASTY;  Surgeon: Neil Balls, MD;  Location: [facility];  Service: Orthopedics;  Laterality: Right;   Patient Active Problem List   Diagnosis Date Noted   Partial nontraumatic tear of left rotator cuff 08/06/2023   Impingement syndrome of left shoulder 08/06/2023   Primary osteoarthritis of right knee 03/15/2020   Status post total knee replacement, right 03/15/2020   Incontinence of feces    Morbid obesity (HCC) 07/12/2018   Allergic rhinitis 07/22/2017   Tear of medial meniscus of right knee, current 03/03/2017   Tear of lateral meniscus of right knee, current 03/03/2017   Vitamin D  deficiency 12/04/2016   Baker's cyst of knee, right 05/21/2016   Edema 12/03/2015   Liver fibrosis 07/25/2015   S/P laparoscopic cholecystectomy 11/25/2014   Knee pain, right 07/17/2014   Physical exam 07/17/2014   Pap smear for cervical cancer screening 07/17/2014   Hyperlipidemia 01/12/2014   History of hepatitis C 04/27/2013   Substance abuse in remission (HCC) 04/27/2013   Hypertension     Depression     PCP: Jess Morita, MD  REFERRING PROVIDER: Wes Hamman, MD  REFERRING DIAG: [phone] (ICD-10-CM) - Partial nontraumatic tear of left rotator cuff M75.42 (ICD-10-CM) - Impingement syndrome of left shoulder M25.512,G89.29 (ICD-10-CM) - Chronic left shoulder pain M19.012 (ICD-10-CM) - Primary osteoarthritis, left shoulder  THERAPY DIAG:  No diagnosis found.  Rationale for Evaluation and Treatment: Rehabilitation  ONSET DATE: ***  SUBJECTIVE:                                                                                                                                                                                      SUBJECTIVE  STATEMENT: *** Hand dominance: {MISC; OT HAND DOMINANCE:[phone]}  PERTINENT HISTORY: ***  PAIN:  Are you having pain? {OPRCPAIN:27236}  PRECAUTIONS: {Therapy precautions:24002}  RED FLAGS: {PT Red Flags:29287}   WEIGHT BEARING RESTRICTIONS: {Yes ***/No:24003}  FALLS:  Has patient fallen in last 6 months? {fallsyesno:27318}  LIVING ENVIRONMENT: Lives with: {OPRC lives with:25569::"lives with their family"} Lives in: {Lives in:25570} Stairs: {opstairs:27293} Has following equipment at home: {Assistive devices:23999}  OCCUPATION: ***  PLOF: {PLOF:24004}  PATIENT GOALS:***  NEXT MD VISIT:   OBJECTIVE:  Note: Objective measures were completed at Evaluation unless otherwise noted.  DIAGNOSTIC FINDINGS:  ***  PATIENT SURVEYS:  {rehab surveys:24030:a}  COGNITION: Overall cognitive status: {cognition:24006}     SENSATION: {sensation:27233}  POSTURE: ***  UPPER EXTREMITY ROM:   {AROM/PROM:27142} ROM Right eval Left eval  Shoulder flexion    Shoulder extension    Shoulder abduction    Shoulder adduction    Shoulder internal rotation    Shoulder external rotation    Elbow flexion    Elbow extension    Wrist flexion    Wrist extension    Wrist ulnar deviation    Wrist radial deviation    Wrist  pronation    Wrist supination    (Blank rows = not tested)  UPPER EXTREMITY MMT:  MMT Right eval Left eval  Shoulder flexion    Shoulder extension    Shoulder abduction    Shoulder adduction    Shoulder internal rotation    Shoulder external rotation    Middle trapezius    Lower trapezius    Elbow flexion    Elbow extension    Wrist flexion    Wrist extension    Wrist ulnar deviation    Wrist radial deviation    Wrist pronation    Wrist supination    Grip strength (lbs)    (Blank rows = not tested)  SHOULDER SPECIAL TESTS: Impingement tests: {shoulder impingement test:25231:a} SLAP lesions: {SLAP lesions:25232} Instability tests: {shoulder instability test:25233} Rotator cuff assessment: {rotator cuff assessment:25234} Biceps assessment: {biceps assessment:25235}  JOINT MOBILITY TESTING:  ***  PALPATION:  ***                                                                                                                             TREATMENT DATE: ***   PATIENT EDUCATION: Education details: *** Person educated: {Person educated:25204} Education method: {Education Method:25205} Education comprehension: {Education Comprehension:25206}  HOME EXERCISE PROGRAM: ***  ASSESSMENT:  CLINICAL IMPRESSION: Patient is a *** y.o. *** who was seen today for physical therapy evaluation and treatment for ***.   OBJECTIVE IMPAIRMENTS: {opptimpairments:25111}.   ACTIVITY LIMITATIONS: {activitylimitations:27494}  PARTICIPATION LIMITATIONS: {participationrestrictions:25113}  PERSONAL FACTORS: {Personal factors:25162} are also affecting patient's functional outcome.   REHAB POTENTIAL: {rehabpotential:25112}  CLINICAL DECISION MAKING: {clinical decision making:25114}  EVALUATION COMPLEXITY: {Evaluation complexity:25115}   GOALS: Goals reviewed with patient? {yes/no:20286}  SHORT TERM GOALS: Target date: ***  *** Baseline: Goal status: INITIAL  2.   *** Baseline:  Goal status: INITIAL  3.  *** Baseline:  Goal status: INITIAL  4.  *** Baseline:  Goal status: INITIAL  5.  *** Baseline:  Goal status: INITIAL  6.  *** Baseline:  Goal status: INITIAL  LONG TERM GOALS: Target date: ***  *** Baseline:  Goal status: INITIAL  2.  *** Baseline:  Goal status: INITIAL  3.  *** Baseline:  Goal status: INITIAL  4.  *** Baseline:  Goal status: INITIAL  5.  *** Baseline:  Goal status: INITIAL  6.  *** Baseline:  Goal status: INITIAL  PLAN:  PT FREQUENCY: {rehab frequency:25116}  PT DURATION: {rehab duration:25117}  PLANNED INTERVENTIONS: {rehab planned interventions:25118::"97110-Therapeutic exercises","97530- Therapeutic [phone]- Neuromuscular re-education","97535- Self XBMW","41324- Manual therapy"}  PLAN FOR NEXT SESSION: ***   Rickey Sadowski April Ma L Ernesta Trabert, PT 10/28/2023, 7:54 AM

## 2023-11-02 ENCOUNTER — Ambulatory Visit (HOSPITAL_BASED_OUTPATIENT_CLINIC_OR_DEPARTMENT_OTHER)

## 2023-11-02 ENCOUNTER — Encounter: Payer: Self-pay | Admitting: Student in an Organized Health Care Education/Training Program

## 2023-11-02 ENCOUNTER — Ambulatory Visit (INDEPENDENT_AMBULATORY_CARE_PROVIDER_SITE_OTHER): Admitting: Student in an Organized Health Care Education/Training Program

## 2023-11-02 ENCOUNTER — Ambulatory Visit: Admitting: Orthopaedic Surgery

## 2023-11-02 ENCOUNTER — Ambulatory Visit: Payer: Self-pay

## 2023-11-02 VITALS — BP 130/75 | HR 81 | Temp 97.8°F | Wt 178.0 lb

## 2023-11-02 DIAGNOSIS — R1032 Left lower quadrant pain: Secondary | ICD-10-CM

## 2023-11-02 DIAGNOSIS — K5792 Diverticulitis of intestine, part unspecified, without perforation or abscess without bleeding: Secondary | ICD-10-CM | POA: Insufficient documentation

## 2023-11-02 MED ORDER — AMOXICILLIN-POT CLAVULANATE 875-125 MG PO TABS: 1.0000 | ORAL_TABLET | Freq: Two times a day (BID) | ORAL | 0 refills | Status: AC

## 2023-11-02 NOTE — Telephone Encounter (Signed)
 FYI Only or Action Required?: FYI only for provider  Patient was last seen in primary care on 03/04/2023 by Jess Morita, MD. Called Nurse Triage reporting Abdominal Pain. Symptoms began 1 hour ago. Interventions attempted: Nothing. Symptoms are: unchanged.  Triage Disposition: See HCP Within 4 Hours (Or PCP Triage)  Patient/caregiver understands and will follow disposition?: Yes           Copied from CRM [id number]. Topic: Clinical - Red Word Triage >> Nov 02, 2023  9:04 AM Vanessa [name] E wrote: Vanessa [name] that prompted transfer to Nurse Triage: Weakness, stomach pain. Patient just left work as she is having stomach pain, body weakness, and she is nauseated. Could not eat breakfast this morning from nausea. Symptoms started this morning. Reason for Disposition  [1] MILD-MODERATE pain AND [2] constant AND [3] present > 2 hours  Answer Assessment - Initial Assessment Questions 1. LOCATION: "Where does it hurt?"      Lower abd in the middle and left side 2. RADIATION: "Does the pain shoot anywhere else?" (e.g., chest, back)     no 3. ONSET: "When did the pain begin?" (e.g., minutes, hours or days ago)      About an hour ago 4. SUDDEN: "Gradual or sudden onset?"     sudden 5. PATTERN "Does the pain come and go, or is it constant?"    - If it comes and goes: "How long does it last?" "Do you have pain now?"     (Note: Comes and goes means the pain is intermittent. It goes away completely between bouts.)    - If constant: "Is it getting better, staying the same, or getting worse?"      (Note: Constant means the pain never goes away completely; most serious pain is constant and gets worse.)      constant 6. SEVERITY: "How bad is the pain?"  (e.g., Scale 1-10; mild, moderate, or severe)    - MILD (1-3): Doesn't interfere with normal activities, abdomen soft and not tender to touch.     - MODERATE (4-7): Interferes with normal activities or awakens from sleep, abdomen tender to touch.      - SEVERE (8-10): Excruciating pain, doubled over, unable to do any normal activities.       8/10 7. RECURRENT SYMPTOM: "Have you ever had this type of stomach pain before?" If Yes, ask: "When was the last time?" and "What happened that time?"      no 8. CAUSE: "What do you think is causing the stomach pain?"     no 9. RELIEVING/AGGRAVATING FACTORS: "What makes it better or worse?" (e.g., antacids, bending or twisting motion, bowel movement)     no 10. OTHER SYMPTOMS: "Do you have any other symptoms?" (e.g., back pain, diarrhea, fever, urination pain, vomiting)       Nausea, vomiting x 1 episode, weakness  Protocols used: Abdominal Pain - Female-A-AH

## 2023-11-02 NOTE — Assessment & Plan Note (Signed)
 Acute problem.  Exam shows left lower quadrant pain and is associated with nausea, vomiting, and generalized malaise.  Seems most consistent with acute diverticulitis.  She has had a cholecystectomy, other possibility would be developing small bowel obstruction.  Viral gastroenteritis is a possibility, but would be diagnosis of exclusion.  She has chronic health conditions including substance use disorder, hypertension, so will start treatment with antibiotics with Augmentin .  Will get a CT of the abdomen pelvis today to evaluate for diverticulitis, rule out small bowel obstruction, will look for other structural cause of her discomfort.  Check labs today.  Gave her clear instructions to go to the emergency department if she is developing symptoms of bowel obstruction later today or early tomorrow.

## 2023-11-02 NOTE — Progress Notes (Signed)
   Acute Office Visit  Subjective:     Patient ID: Vanessa [name], female    DOB: 1961/04/10, 63 y.o.   MRN: [id number]  Chief Complaint  Patient presents with   Abdominal Pain    Abdominal pain,Nausea and weakness that started randomly today.     HPI  63 year old person living with opioid use disorder on Suboxone  here for acute visit for 1 day of progressive nausea, vomiting, food intolerance, and abdominal discomfort.  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  When she got to work this morning she started to feel unwell.  Tried to eat some breakfast but then had an episode of vomiting.  Does not feel like she wants to eat anything now.  No fevers or chills but she does have generalized malaise and fatigue.  She has generalized abdominal discomfort, but localizes it down towards the left lower quadrant at times.  She has had a history of a cholecystectomy.  No other recent changes in her medications.  No missed doses of Suboxone  or Xanax .  She does not feel like this is withdrawal symptoms.  Last bowel movement was yesterday, no hematochezia.  Has never felt an illness like this before.      Objective:    BP 130/75   Pulse 81   Temp 97.8 F (36.6 C) (Oral)   Wt 178 lb (80.7 kg)   SpO2 95%   BMI 33.63 kg/m    Physical Exam  Gen: Tired appearing woman Heart: Regular, no murmur Lungs: Chronic bronchitic sounds, but no crackles or wheezing Abd: Soft, mildly tender to palpation throughout, moderate tenderness to palpation in the left lower quadrant, no rebound tenderness, no distention, no guarding Ext: Warm, no edema      Assessment & Plan:   Problem List Items Addressed This Visit       Unprioritized   Acute diverticulitis - Primary   Acute problem.  Exam shows left lower quadrant pain and is associated with nausea, vomiting, and generalized malaise.  Seems most consistent with acute diverticulitis.  She has had a cholecystectomy, other possibility would be  developing small bowel obstruction.  Viral gastroenteritis is a possibility, but would be diagnosis of exclusion.  She has chronic health conditions including substance use disorder, hypertension, so will start treatment with antibiotics with Augmentin .  Will get a CT of the abdomen pelvis today to evaluate for diverticulitis, rule out small bowel obstruction, will look for other structural cause of her discomfort.  Check labs today.  Gave her clear instructions to go to the emergency department if she is developing symptoms of bowel obstruction later today or early tomorrow.      Relevant Medications   amoxicillin -clavulanate (AUGMENTIN ) 875-125 MG tablet   Other Relevant Orders   CBC with Differential/Platelet   Comprehensive metabolic panel with GFR   Lipase   CT ABDOMEN PELVIS W CONTRAST   Other Visit Diagnoses       Left lower quadrant abdominal pain       Relevant Orders   CT ABDOMEN PELVIS W CONTRAST       Meds ordered this encounter  Medications   amoxicillin -clavulanate (AUGMENTIN ) 875-125 MG tablet    Sig: Take 1 tablet by mouth 2 (two) times daily for 7 days.    Dispense:  14 tablet    Refill:  0    No follow-ups on file.  Ether Hercules, MD

## 2023-11-03 ENCOUNTER — Ambulatory Visit (HOSPITAL_BASED_OUTPATIENT_CLINIC_OR_DEPARTMENT_OTHER): Admission: RE | Admit: 2023-11-03 | Source: Ambulatory Visit

## 2023-11-03 ENCOUNTER — Ambulatory Visit: Payer: Self-pay | Admitting: Student in an Organized Health Care Education/Training Program

## 2023-11-03 LAB — COMPREHENSIVE METABOLIC PANEL WITH GFR
AG Ratio: 1.4 (calc) (ref 1.0–2.5)
ALT: 15 U/L (ref 6–29)
AST: 16 U/L (ref 10–35)
Albumin: 4.2 g/dL (ref 3.6–5.1)
Alkaline phosphatase (APISO): 56 U/L (ref 37–153)
BUN: 17 mg/dL (ref 7–25)
CO2: 27 mmol/L (ref 20–32)
Calcium: 9 mg/dL (ref 8.6–10.4)
Chloride: 103 mmol/L (ref 98–110)
Creat: 0.56 mg/dL (ref 0.50–1.05)
Globulin: 2.9 g/dL (ref 1.9–3.7)
Glucose, Bld: 113 mg/dL — ABNORMAL HIGH (ref 65–99)
Potassium: 3.7 mmol/L (ref 3.5–5.3)
Sodium: 138 mmol/L (ref 135–146)
Total Bilirubin: 0.2 mg/dL (ref 0.2–1.2)
Total Protein: 7.1 g/dL (ref 6.1–8.1)
eGFR: 103 mL/min/{1.73_m2} (ref >=60)

## 2023-11-03 LAB — CBC WITH DIFFERENTIAL/PLATELET
Absolute Lymphocytes: 1669 {cells}/uL (ref 850–3900)
Absolute Monocytes: 298 {cells}/uL (ref 200–950)
Basophils Absolute: 43 {cells}/uL (ref 0–200)
Basophils Relative: 0.6 %
Eosinophils Absolute: 28 {cells}/uL (ref 15–500)
Eosinophils Relative: 0.4 %
HCT: 43.2 % (ref 35.0–45.0)
Hemoglobin: 13.9 g/dL (ref 11.7–15.5)
MCH: 28.9 pg (ref 27.0–33.0)
MCHC: 32.2 g/dL (ref 32.0–36.0)
MCV: 89.8 fL (ref 80.0–100.0)
MPV: 11.3 fL (ref 7.5–12.5)
Monocytes Relative: 4.2 %
Neutro Abs: 5062 {cells}/uL (ref 1500–7800)
Neutrophils Relative %: 71.3 %
Platelets: 236 10*3/uL (ref 140–400)
RBC: 4.81 10*6/uL (ref 3.80–5.10)
RDW: 14.4 % (ref 11.0–15.0)
Total Lymphocyte: 23.5 %
WBC: 7.1 10*3/uL (ref 3.8–10.8)

## 2023-11-03 LAB — LIPASE: Lipase: 18 U/L (ref 7–60)

## 2023-11-21 ENCOUNTER — Other Ambulatory Visit: Payer: Self-pay | Admitting: Family Medicine

## 2023-11-25 ENCOUNTER — Ambulatory Visit: Admitting: Physical Therapy

## 2023-11-25 NOTE — Therapy (Incomplete)
 OUTPATIENT PHYSICAL THERAPY EVALUATION   Patient Name: Vanessa [name] MRN: [id number] DOB:Aug 16, 1960, 63 y.o., female Today's Date: 11/25/2023  END OF SESSION:    Past Medical History:  Diagnosis Date   Anxiety    Arthritis    Chronic knee pain    Depression    Diverticulosis    Gall stone    Hepatitis C    Hx of Hepatitis C   Hypertension    Syphilis    Past Surgical History:  Procedure Laterality Date   ANAL RECTAL MANOMETRY N/A 02/20/2019   Procedure: ANO RECTAL MANOMETRY;  Surgeon: Teressa Toribio SQUIBB, MD;  Location: [facility];  Service: Endoscopy;  Laterality: N/A;   CARPAL TUNNEL RELEASE Left    CHOLECYSTECTOMY N/A 11/24/2014   Procedure: LAPAROSCOPIC CHOLECYSTECTOMY WITH INTRAOPERATIVE CHOLANGIOGRAM;  Surgeon: Camellia Blush, MD;  Location: [facility];  Service: General;  Laterality: N/A;   ECTOPIC PREGNANCY SURGERY     TOTAL KNEE ARTHROPLASTY Right 03/15/2020   Procedure: TOTAL KNEE ARTHROPLASTY;  Surgeon: Yvone Rush, MD;  Location: [facility];  Service: Orthopedics;  Laterality: Right;   Patient Active Problem List   Diagnosis Date Noted   Acute diverticulitis 11/02/2023   Partial nontraumatic tear of left rotator cuff 08/06/2023   Impingement syndrome of left shoulder 08/06/2023   Primary osteoarthritis of right knee 03/15/2020   Status post total knee replacement, right 03/15/2020   Incontinence of feces    Morbid obesity (HCC) 07/12/2018   Allergic rhinitis 07/22/2017   Tear of medial meniscus of right knee, current 03/03/2017   Tear of lateral meniscus of right knee, current 03/03/2017   Vitamin D  deficiency 12/04/2016   Baker's cyst of knee, right 05/21/2016   Edema 12/03/2015   Liver fibrosis 07/25/2015   S/P laparoscopic cholecystectomy 11/25/2014   Knee pain, right 07/17/2014   Physical exam 07/17/2014   Pap smear for cervical cancer screening 07/17/2014   Hyperlipidemia 01/12/2014   History of hepatitis C 04/27/2013   Substance abuse in remission (HCC)  04/27/2013   Hypertension    Depression     PCP: Mahlon Comer BRAVO, MD  REFERRING PROVIDER: Jerri Kay HERO, MD  REFERRING DIAG: [phone] (ICD-10-CM) - Chronic left shoulder pain  Rationale for Evaluation and Treatment: Rehabilitation  THERAPY DIAG:  No diagnosis found.  ONSET DATE: pain since October 2024   SUBJECTIVE:                                                                                                                                                                                           SUBJECTIVE STATEMENT: She has been dealing with left shoulder pain since October  2024. Insidious onset of pain without injury. 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 She has deferred surgery at this time and would like to continue PT.  PERTINENT HISTORY:  Anxiety, depression, OA, HTN  PAIN:  NPRS scale: *** 8/10 upon arrival Pain location:left lateral shoulder Pain description: constant, achy, sharp Aggravating factors:  Relieving factors: rest, meds   PRECAUTIONS:  None  RED FLAGS: None   WEIGHT BEARING RESTRICTIONS:  No  FALLS:  Has patient fallen in last 6 months? No  OCCUPATION:  Dialysis tech  PLOF:  Independent  PATIENT GOALS:  Reduce pain ***   OBJECTIVE:  Note: Objective measures were completed at Evaluation unless otherwise noted.  DIAGNOSTIC FINDINGS:  MRI IMPRESSION: 1. Moderate anterior greater than mid AP dimension of the supraspinatus tendinosis with probable tiny, thin, linear 4 x 1 mm longitudinal midsubstance tear within the anterior supraspinatus tendon footprint. No tendon retraction. 2. Two adjacent longitudinal linear fluid bright interstitial tears within the anterior infraspinatus tendon insertion measuring only 1 mm in thickness and approximately 10 mm in length. No tendon retraction. 3. There is a 10 mm oval well corticated ossicle within the midsubstance of the  mid height of the subscapularis tendon insertion. This likely reflects the sequela of a remote partial-thickness tear. 4. Mild proximal long head of the biceps tendinosis. 5. Moderate degenerative changes of the acromioclavicular joint. 6. Moderate glenohumeral cartilage thinning. Mild glenohumeral joint effusion with numerous small low signal foci, possible small loose bodies versus focal synovitis.  PATIENT SURVEYS:  11/25/23 Patient-specific activity scoring scheme   0 represents "unable to perform." 10 represents "able to perform at prior level. 0 1 2 3 4 5 6 7 8 9  10 (Date and Score) Activity Initial  Activity Eval     Any movement of left shoulder ***    2.   Lifting arm over head  ***    3.   Laying on left side ***       Score ***    Total score = sum of the activity scores/number of activities Minimum detectable change (90%CI) for average score = 2 points Minimum detectable change (90%CI) for single activity score = 3 points   COGNITIVE STATUS: Within functional limits for tasks assessed   SENSATION: WFL   HAND DOMINANCE:  Left   PALPATION: Eval: TTP left RTC insertion and lateral deltoid   UPPER EXTREMITY ROM: ***  ROM Right eval Left eval  Shoulder flexion A: 105   Shoulder extension    Shoulder abduction A:105   Shoulder adduction    Shoulder extension    Shoulder internal rotation L4   Shoulder external rotation Occiput    (Blank rows = not tested)   UPPER EXTREMITY MMT: ***  MMT Right eval Left eval  Shoulder flexion 4   Shoulder extension    Shoulder abduction 4   Shoulder adduction    Shoulder extension 4   Shoulder internal rotation 4   Shoulder external rotation 4    (Blank rows = not tested)  TREATMENT:  DATE:  11/25/23 *** HEP creation and review with demonstration, cues, and instructions not to push  into pain. See below for details on specific exercises  Educated on PT POC and clinical findings     PATIENT EDUCATION:  Education details: HEP Person educated: Patient Education method: Programmer, multimedia, Facilities manager, Verbal cues, and Handouts Education comprehension: verbalized understanding and needs further education  HOME EXERCISE PROGRAM: Access Code: [email or web address] URL: [email or web address] Date: 07/20/2023 Prepared by: Redell Moose  Exercises - Supine Shoulder Flexion Extension AAROM with Dowel  - 2 x daily - 6 x weekly - 1 sets - 10 reps - Supine Shoulder Abduction AAROM with Dowel  - 2 x daily - 6 x weekly - 1 sets - 10 reps - Supine Shoulder External Rotation with Dowel  - 2 x daily - 6 x weekly - 1 sets - 10 reps - Standing Shoulder Row with Anchored Resistance  - 2 x daily - 6 x weekly - 2 sets - 10 reps - Shoulder extension with resistance - Neutral  - 2 x daily - 6 x weekly - 2 sets - 10 reps - Shoulder Internal Rotation with Resistance (Mirrored)  - 2 x daily - 6 x weekly - 2 sets - 10 reps - Shoulder External Rotation with Anchored Resistance  - 2 x daily - 6 x weekly - 2 sets - 10 reps   ASSESSMENT:  CLINICAL IMPRESSION: *** Patient is a 63 y.o. female who was seen today for physical therapy evaluation and treatment for Lt shoulder pain with OA and partial RTC tears.  She will benefit from PT to address deficits listed.     OBJECTIVE IMPAIRMENTS: decreased knowledge of condition, decreased ROM, decreased strength, impaired flexibility, and pain.   ACTIVITY LIMITATIONS: carrying, lifting, sleeping, dressing, and reach over head  PARTICIPATION LIMITATIONS: meal prep, cleaning, laundry, driving, and occupation  PERSONAL FACTORS: 3+ comorbidities: Anxiety, depression, OA, HTN are also affecting patient's functional outcome.   REHAB POTENTIAL: Good  CLINICAL DECISION MAKING: Stable/uncomplicated  EVALUATION COMPLEXITY: Low   GOALS: Goals reviewed  with patient? Yes  SHORT TERM GOALS: Target date: ***    Independent with initial HEP Goal status: INITIAL  2.  Patient will reduce pain to less than 6/10 from 8/10 *** Goal status: INITIAL    LONG TERM GOALS: Target date: ***    Independent with final HEP Goal status: INITIAL  2.  PSFS improved to *** to show improved function. Goal status: INITIAL  3.  UE strength improved to *** 4+/5 to improve function Goal status: INIITAL  4.  Report pain <  *** 4/10 with normal activity for improved function Goal status: INITIAL  5.  ROM in left shoulder improved to [facility] at least 140 deg AROM for flexion and scaption to improve overhead reaching *** Goal status: INITIAL   PLAN:  PT FREQUENCY: 1x/week  PT DURATION: 8 weeks ***  PLANNED INTERVENTIONS: 97110-Therapeutic exercises, 97530- Therapeutic activity, [id number]- Neuromuscular re-education, 97535- Self Care, 02859- Manual therapy, G0283- Electrical stimulation (unattended), 97035- Ultrasound, 02966- Ionotophoresis 4mg /ml Dexamethasone , Dry Needling, and Joint mobilization.  PLAN FOR NEXT SESSION: *** review HEP, how was inoto? Gentle strength and ROM as tolerated to improve function and reduce pain.   NEXT MD VISIT: PRN     Corean JULIANNA Ku, PT, DPT 11/25/23 7:20 AM

## 2023-11-29 ENCOUNTER — Other Ambulatory Visit (HOSPITAL_BASED_OUTPATIENT_CLINIC_OR_DEPARTMENT_OTHER): Payer: Self-pay

## 2023-11-29 ENCOUNTER — Ambulatory Visit (HOSPITAL_BASED_OUTPATIENT_CLINIC_OR_DEPARTMENT_OTHER): Admitting: Student

## 2023-11-29 ENCOUNTER — Encounter (HOSPITAL_BASED_OUTPATIENT_CLINIC_OR_DEPARTMENT_OTHER): Payer: Self-pay | Admitting: Student

## 2023-11-29 DIAGNOSIS — M25512 Pain in left shoulder: Secondary | ICD-10-CM

## 2023-11-29 DIAGNOSIS — G8929 Other chronic pain: Secondary | ICD-10-CM

## 2023-11-29 DIAGNOSIS — M19012 Primary osteoarthritis, left shoulder: Secondary | ICD-10-CM | POA: Diagnosis not present

## 2023-11-29 MED ORDER — TRIAMCINOLONE ACETONIDE 40 MG/ML IJ SUSP
2.0000 mL | INTRAMUSCULAR | Status: AC | PRN
  Administered 2023-11-29: 2 mL via INTRA_ARTICULAR

## 2023-11-29 MED ORDER — LIDOCAINE HCL 1 % IJ SOLN
4.0000 mL | INTRAMUSCULAR | Status: AC | PRN
  Administered 2023-11-29: 4 mL

## 2023-11-29 NOTE — Progress Notes (Signed)
 Chief Complaint: Left shoulder pain     History of Present Illness:    Vanessa [name] is a 63 y.o. female who presents today for follow-up evaluation of left shoulder pain.  Patient underwent 2 cortisone injections in January 2025, first on 1/9 with Dr. Jerri into the subacromial space followed by a glenohumeral injection by Dr. Burnetta on 1/31.  Reports that the second injection gave her more relief.  Recently she has noted increased, moderate pain levels within the shoulder as well as decreased range of motion.  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  She is left-hand dominant.   Surgical History:   None  PMH/PSH/Family History/Social History/Meds/Allergies:    Past Medical History:  Diagnosis Date   Anxiety    Arthritis    Chronic knee pain    Depression    Diverticulosis    Gall stone    Hepatitis C    Hx of Hepatitis C   Hypertension    Syphilis    Past Surgical History:  Procedure Laterality Date   ANAL RECTAL MANOMETRY N/A 02/20/2019   Procedure: ANO RECTAL MANOMETRY;  Surgeon: Teressa Toribio SQUIBB, MD;  Location: [facility];  Service: Endoscopy;  Laterality: N/A;   CARPAL TUNNEL RELEASE Left    CHOLECYSTECTOMY N/A 11/24/2014   Procedure: LAPAROSCOPIC CHOLECYSTECTOMY WITH INTRAOPERATIVE CHOLANGIOGRAM;  Surgeon: Camellia Blush, MD;  Location: [facility];  Service: General;  Laterality: N/A;   ECTOPIC PREGNANCY SURGERY     TOTAL KNEE ARTHROPLASTY Right 03/15/2020   Procedure: TOTAL KNEE ARTHROPLASTY;  Surgeon: Yvone Rush, MD;  Location: [facility];  Service: Orthopedics;  Laterality: Right;   Social History   Socioeconomic History   Marital status: Single    Spouse name: Not on file   Number of children: 1   Years of education: Not on file   Highest education level: Not on file  Occupational History   Occupation: Dialysis Tech  Tobacco Use   Smoking status: Every Day    Current packs/day: 0.00     Average packs/day: 1 pack/day for 35.0 years (35.0 ttl pk-yrs)    Types: E-cigarettes, Cigarettes    Start date: 05/25/1994    Last attempt to quit: 03/03/2016    Years since quitting: 7.7   Smokeless tobacco: Never  Vaping Use   Vaping status: Former   Substances: Nicotine, Flavoring  Substance and Sexual Activity   Alcohol use: No    Alcohol/week: 0.0 standard drinks of alcohol    Comment: last alcohol was in the 1990's   Drug use: Not Currently    Types: Marijuana, Crack cocaine    Comment: last drug use was in the 1990's; never used heroin   Sexual activity: Not Currently    Birth control/protection: Post-menopausal  Other Topics Concern   Not on file  Social History Narrative   Works at [employer] in [place]. Lives with daughter.   Social Drivers of Corporate investment banker Strain: Not on file  Food Insecurity: Not on file  Transportation Needs: Not on file  Physical Activity: Not on file  Stress: Not on file  Social Connections: Not on file   Family History  Problem Relation Age of Onset   Hypertension Mother    Diabetes Mother    Drug  abuse Brother    Esophageal cancer Brother 38   Colon cancer Brother 54   Hypertension Sister        x's 2   Diabetes Sister        oldest sister   Stomach cancer Neg Hx    Breast cancer Neg Hx    Rectal cancer Neg Hx    No Known Allergies Current Outpatient Medications  Medication Sig Dispense Refill   albuterol  (VENTOLIN  HFA) 108 (90 Base) MCG/ACT inhaler Inhale 2 puffs into the lungs every 4 (four) hours as needed.     ALPRAZolam  (XANAX ) 0.5 MG tablet TAKE 1 TABLET BY MOUTH TWICE A DAY AS NEEDED FOR ANXIETY 60 tablet 0   benazepril -hydrochlorthiazide (LOTENSIN  HCT) 10-12.5 MG tablet TAKE 1 TABLET BY MOUTH EVERY DAY 90 tablet 1   fluticasone  (FLONASE ) 50 MCG/ACT nasal spray SPRAY 2 SPRAYS INTO EACH NOSTRIL EVERY DAY 48 mL 1   hyoscyamine  (LEVSIN /SL) 0.125 MG SL tablet Place 1 tablet (0.125 mg total) under the tongue  every 4 (four) hours as needed. 30 tablet 0   ketorolac  (TORADOL ) 10 MG tablet Take 1 tablet (10 mg total) by mouth every 6 (six) hours as needed. 20 tablet 0   Multiple Vitamin (MULTIVITAMIN WITH MINERALS) TABS tablet Take 1 tablet by mouth daily.     promethazine -dextromethorphan (PROMETHAZINE -DM) 6.25-15 MG/5ML syrup Take 5 mLs by mouth 4 (four) times daily as needed for cough. 240 mL 0   SUBOXONE  8-2 MG FILM Place 1 Film under the tongue 2 (two) times daily.      venlafaxine  XR (EFFEXOR -XR) 150 MG 24 hr capsule TAKE 1 CAPSULE BY MOUTH DAILY WITH BREAKFAST. TAKE IN ADDITION TO 75MG  FOR A TOTAL OF 225MG  DAILY 90 capsule 0   venlafaxine  XR (EFFEXOR -XR) 75 MG 24 hr capsule Take 1 capsule (75 mg total) by mouth daily with breakfast. 90 capsule 0   No current facility-administered medications for this visit.   No results found.  Review of Systems:   A ROS was performed including pertinent positives and negatives as documented in the HPI.  Physical Exam :   Constitutional: NAD and appears stated age Neurological: Alert and oriented Psych: Appropriate affect and cooperative There were no vitals taken for this visit.   Comprehensive Musculoskeletal Exam:    Exam of the left shoulder demonstrates active range of motion to 90 degrees forward flexion, 20 degrees external rotation, and internal Tatian to back pocket.  No overlying erythema or warmth.  Tenderness over the anterior glenohumeral joint.  Imaging:     Assessment:   63 y.o. female with chronic left shoulder pain.  MRI from January of this year does show notable degenerative changes to the rotator cuff and within the glenohumeral joint.  Shoulder arthroscopy was discussed and recommended with Dr. Jerri although the patient states that she will have to hold off on this for now until a more opportune time.  Her function is significantly affecting her daily activities and job duties as a Engineer, civil (consulting).  Given this I have offered to repeat a  glenohumeral injection which has historically given her the most relief.  Patient is agreeable and injection was performed into the glenohumeral joint using ultrasound guidance.  Will plan to have her follow-up as needed.  Plan :    - Left shoulder glenohumeral injection performed today - Follow-up in clinic as needed     Procedure Note  Patient: Vanessa [name]  Date of Birth: 12-Jul-1960           MRN: [id number]             Visit Date: 11/29/2023  Procedures: Visit Diagnoses:  1. Chronic left shoulder pain   2. Primary osteoarthritis, left shoulder     Large Joint Inj: L glenohumeral on 11/29/2023 5:42 PM Indications: pain Details: 22 G 1.5 in needle, ultrasound-guided anterior approach Medications: 4 mL lidocaine  1 %; 2 mL triamcinolone  acetonide 40 MG/ML Outcome: tolerated well, no immediate complications Procedure, treatment alternatives, risks and benefits explained, specific risks discussed. Consent was given by the patient. Immediately prior to procedure a time out was called to verify the correct patient, procedure, equipment, support staff and site/side marked as required. Patient was prepped and draped in the usual sterile fashion.       I personally saw and evaluated the patient, and participated in the management and treatment plan.  Leonce Reveal, PA-C Orthopedics

## 2023-12-01 ENCOUNTER — Other Ambulatory Visit: Payer: Self-pay | Admitting: Family Medicine

## 2023-12-01 ENCOUNTER — Telehealth: Payer: Self-pay

## 2023-12-01 NOTE — Telephone Encounter (Signed)
 Unable to leave vm.

## 2024-01-03 ENCOUNTER — Other Ambulatory Visit: Payer: Self-pay | Admitting: Family Medicine

## 2024-01-03 NOTE — Telephone Encounter (Signed)
 Requested Prescriptions   Pending Prescriptions Disp Refills   ALPRAZolam  (XANAX ) 0.5 MG tablet [Pharmacy Med Name: ALPRAZOLAM  0.5 MG TABLET] 60 tablet 0    Sig: TAKE 1 TABLET BY MOUTH TWICE A DAY AS NEEDED FOR ANXIETY     Date of patient request: 01/03/2024 Last office visit: 03/04/2023 Upcoming visit: 02/24/2024 Date of last refill: 10/21/2023 Last refill amount: 60

## 2024-02-08 ENCOUNTER — Other Ambulatory Visit: Payer: Self-pay | Admitting: Family Medicine

## 2024-02-08 DIAGNOSIS — F331 Major depressive disorder, recurrent, moderate: Secondary | ICD-10-CM

## 2024-02-22 ENCOUNTER — Other Ambulatory Visit: Payer: Self-pay | Admitting: Family Medicine

## 2024-02-24 ENCOUNTER — Encounter: Payer: Self-pay | Admitting: Family Medicine

## 2024-02-24 ENCOUNTER — Ambulatory Visit: Admitting: Family Medicine

## 2024-02-24 VITALS — BP 110/60 | HR 73 | Temp 98.3°F | Ht 61.0 in | Wt 173.4 lb

## 2024-02-24 DIAGNOSIS — Z0000 Encounter for general adult medical examination without abnormal findings: Secondary | ICD-10-CM

## 2024-02-24 DIAGNOSIS — I10 Essential (primary) hypertension: Secondary | ICD-10-CM | POA: Diagnosis not present

## 2024-02-24 DIAGNOSIS — Z23 Encounter for immunization: Secondary | ICD-10-CM | POA: Diagnosis not present

## 2024-02-24 DIAGNOSIS — Z1231 Encounter for screening mammogram for malignant neoplasm of breast: Secondary | ICD-10-CM

## 2024-02-24 DIAGNOSIS — Z720 Tobacco use: Secondary | ICD-10-CM

## 2024-02-24 DIAGNOSIS — Z1211 Encounter for screening for malignant neoplasm of colon: Secondary | ICD-10-CM

## 2024-02-24 DIAGNOSIS — E559 Vitamin D deficiency, unspecified: Secondary | ICD-10-CM

## 2024-02-24 LAB — HEPATIC FUNCTION PANEL
ALT: 17 U/L (ref 0–35)
AST: 19 U/L (ref 0–37)
Albumin: 4.1 g/dL (ref 3.5–5.2)
Alkaline Phosphatase: 57 U/L (ref 39–117)
Bilirubin, Direct: 0.1 mg/dL (ref 0.0–0.3)
Total Bilirubin: 0.2 mg/dL (ref 0.2–1.2)
Total Protein: 7.3 g/dL (ref 6.0–8.3)

## 2024-02-24 LAB — BASIC METABOLIC PANEL WITH GFR
BUN: 13 mg/dL (ref 6–23)
CO2: 31 meq/L (ref 19–32)
Calcium: 9.1 mg/dL (ref 8.4–10.5)
Chloride: 102 meq/L (ref 96–112)
Creatinine, Ser: 0.75 mg/dL (ref 0.40–1.20)
GFR: 85.22 mL/min (ref >60.00)
Glucose, Bld: 93 mg/dL (ref 70–99)
Potassium: 3.9 meq/L (ref 3.5–5.1)
Sodium: 140 meq/L (ref 135–145)

## 2024-02-24 LAB — CBC WITH DIFFERENTIAL/PLATELET
Basophils Absolute: 0 K/uL (ref 0.0–0.1)
Basophils Relative: 0.6 % (ref 0.0–3.0)
Eosinophils Absolute: 0 K/uL (ref 0.0–0.7)
Eosinophils Relative: 0.4 % (ref 0.0–5.0)
HCT: 40.9 % (ref 36.0–46.0)
Hemoglobin: 13.7 g/dL (ref 12.0–15.0)
Lymphocytes Relative: 24.8 % (ref 12.0–46.0)
Lymphs Abs: 1.8 K/uL (ref 0.7–4.0)
MCHC: 33.6 g/dL (ref 30.0–36.0)
MCV: 89.3 fl (ref 78.0–100.0)
Monocytes Absolute: 0.3 K/uL (ref 0.1–1.0)
Monocytes Relative: 4.8 % (ref 3.0–12.0)
Neutro Abs: 4.9 K/uL (ref 1.4–7.7)
Neutrophils Relative %: 69.4 % (ref 43.0–77.0)
Platelets: 267 K/uL (ref 150.0–400.0)
RBC: 4.58 Mil/uL (ref 3.87–5.11)
RDW: 14.7 % (ref 11.5–15.5)
WBC: 7.1 K/uL (ref 4.0–10.5)

## 2024-02-24 LAB — LIPID PANEL
Cholesterol: 169 mg/dL (ref 0–200)
HDL: 49.9 mg/dL (ref >39.00)
LDL Cholesterol: 98 mg/dL (ref 0–99)
NonHDL: 119.38
Total CHOL/HDL Ratio: 3
Triglycerides: 107 mg/dL (ref 0.0–149.0)
VLDL: 21.4 mg/dL (ref 0.0–40.0)

## 2024-02-24 LAB — TSH: TSH: 1.37 u[IU]/mL (ref 0.35–5.50)

## 2024-02-24 LAB — VITAMIN D 25 HYDROXY (VIT D DEFICIENCY, FRACTURES): VITD: 12.69 ng/mL — ABNORMAL LOW (ref 30.00–100.00)

## 2024-02-24 NOTE — Assessment & Plan Note (Signed)
 Pt's PE WNL w/ exception of BMI.  Mammo ordered.  Referral placed for repeat colonoscopy.  Flu shot given.  Will get PNA at next visit.  Referral for lung cancer screening placed.  Plan to get pap at future visit as pt wasn't prepared for this today.  Check labs.  Anticipatory guidance provided.

## 2024-02-24 NOTE — Progress Notes (Signed)
   Subjective:    Patient ID: Vanessa [name], female    DOB: Jul 11, 1960, 63 y.o.   MRN: [id number]  HPI CPE- UTD on Tdap.  Due for repeat colonoscopy, mammo.  Will get flu today.  Due for pap.  Patient Care Team    Relationship Specialty Notifications Start End  Mahlon Comer BRAVO, MD PCP - General Family Medicine  04/27/13    Comment: Lavell Bathe, Darletta, MD Referring Physician Anesthesiology  07/17/14   Teressa Toribio SQUIBB, MD (Inactive) Attending Physician Gastroenterology  07/17/14     Health Maintenance  Topic Date Due   Pneumococcal Vaccine: 50+ Years (1 of 2 - PCV) 07/22/1980   Lung Cancer Screening  Never done   Zoster Vaccines- Shingrix (1 of 2) Never done   Mammogram  02/02/2020   Cervical Cancer Screening (HPV/Pap Cotest)  07/13/2023   Colonoscopy  11/15/2023   Influenza Vaccine  12/24/2023   DTaP/Tdap/Td (2 - Td or Tdap) 08/04/2025   Hepatitis C Screening  Completed   HIV Screening  Completed   Hepatitis B Vaccines 19-59 Average Risk  Aged Out   HPV VACCINES  Aged Out   Meningococcal B Vaccine  Aged Out   COVID-19 Vaccine  Discontinued      Review of Systems Patient reports no vision/ hearing changes, adenopathy, fever, weight change,  persistant/recurrent hoarseness , swallowing issues, chest pain, palpitations, edema, persistant/recurrent cough, hemoptysis, dyspnea (rest/exertional/paroxysmal nocturnal), gastrointestinal bleeding (melena, rectal bleeding), abdominal pain, significant heartburn, GU symptoms (dysuria, hematuria, incontinence), Gyn symptoms (abnormal  bleeding, pain),  syncope, focal weakness, memory loss, numbness & tingling, skin/hair/nail changes, abnormal bruising or bleeding, anxiety, or depression.   + bowel incontinence    Objective:   Physical Exam General Appearance:    Alert, cooperative, no distress, appears stated age, obese  Head:    Normocephalic, without obvious abnormality, atraumatic  Eyes:    PERRL, conjunctiva/corneas clear, EOM's  intact both eyes  Ears:    Normal TM's and external ear canals, both ears  Nose:   Nares normal, septum midline, mucosa normal, no drainage    or sinus tenderness  Throat:   Lips, mucosa, and tongue normal; teeth and gums normal  Neck:   Supple, symmetrical, trachea midline, no adenopathy;    Thyroid : no enlargement/tenderness/nodules  Back:     Symmetric, no curvature, ROM normal, no CVA tenderness  Lungs:     Clear to auscultation bilaterally, respirations unlabored  Chest Wall:    No tenderness or deformity   Heart:    Regular rate and rhythm, S1 and S2 normal, no murmur, rub   or gallop  Breast Exam:    Deferred to mammo  Abdomen:     Soft, non-tender, bowel sounds active all four quadrants,    no masses, no organomegaly  Genitalia:    Deferred  Rectal:    Extremities:   Extremities normal, atraumatic, no cyanosis or edema  Pulses:   2+ and symmetric all extremities  Skin:   Skin color, texture, turgor normal, no rashes or lesions  Lymph nodes:   Cervical, supraclavicular, and axillary nodes normal  Neurologic:   CNII-XII intact, normal strength, sensation and reflexes    throughout          Assessment & Plan:

## 2024-02-24 NOTE — Patient Instructions (Signed)
 Follow up in 6 months to recheck blood pressure and cholesterol (we can do pap at this time) We'll notify you of your lab results and make any changes if needed Keep up the good work on healthy diet and regular exercise- you look great!!! We'll call you to schedule your mammogram and GI appts Call with any questions or concerns Stay Safe!  Stay Healthy! Happy Fall!!!

## 2024-02-25 ENCOUNTER — Other Ambulatory Visit: Payer: Self-pay

## 2024-02-25 ENCOUNTER — Ambulatory Visit: Payer: Self-pay | Admitting: Family Medicine

## 2024-02-25 MED ORDER — VITAMIN D (ERGOCALCIFEROL) 1.25 MG (50000 UNIT) PO CAPS: 50000.0000 [IU] | ORAL_CAPSULE | ORAL | 0 refills | Status: AC

## 2024-02-25 NOTE — Progress Notes (Signed)
 Called patient to relay lab results and medication changes. Unable to lvm due to mailbox not being set up

## 2024-02-28 NOTE — Progress Notes (Signed)
 Lab results have been discussed.   Verbalized understanding? Yes  Are there any questions? No

## 2024-03-03 ENCOUNTER — Other Ambulatory Visit: Payer: Self-pay | Admitting: Family Medicine

## 2024-03-03 NOTE — Telephone Encounter (Signed)
 Requested Prescriptions   Pending Prescriptions Disp Refills   ALPRAZolam  (XANAX ) 0.5 MG tablet [Pharmacy Med Name: ALPRAZOLAM  0.5 MG TABLET] 60 tablet 0    Sig: TAKE 1 TABLET BY MOUTH TWICE A DAY AS NEEDED FOR ANXIETY     Date of patient request: 03/03/24 Last office visit: 02/24/2024 Upcoming visit: Visit date not found Date of last refill: 01/03/24 Last refill amount: 60 tablets

## 2024-03-13 ENCOUNTER — Ambulatory Visit (INDEPENDENT_AMBULATORY_CARE_PROVIDER_SITE_OTHER): Admitting: Student

## 2024-03-13 ENCOUNTER — Other Ambulatory Visit (HOSPITAL_BASED_OUTPATIENT_CLINIC_OR_DEPARTMENT_OTHER): Payer: Self-pay

## 2024-03-13 DIAGNOSIS — M19012 Primary osteoarthritis, left shoulder: Secondary | ICD-10-CM | POA: Diagnosis not present

## 2024-03-13 DIAGNOSIS — M25512 Pain in left shoulder: Secondary | ICD-10-CM | POA: Diagnosis not present

## 2024-03-13 MED ORDER — LIDOCAINE HCL 1 % IJ SOLN
4.0000 mL | INTRAMUSCULAR | Status: AC | PRN
  Administered 2024-03-13: 4 mL

## 2024-03-13 MED ORDER — TRIAMCINOLONE ACETONIDE 40 MG/ML IJ SUSP
2.0000 mL | INTRAMUSCULAR | Status: AC | PRN
  Administered 2024-03-13: 2 mL via INTRA_ARTICULAR

## 2024-03-13 NOTE — Progress Notes (Signed)
 Chief Complaint: Left shoulder pain     History of Present Illness:   03/13/24: Patient presents to clinic today for follow-up of ration of her left shoulder.  She was last seen in clinic on 7/7 and received a glenohumeral cortisone injection which she reports gave her 100% relief.  She does have known glenohumeral and AC joint osteoarthritis demonstrated on MRI in January 2025.  Patient reports that she woke up 2 days ago and significant pain and not able to move her shoulder.  Denies any overuse or injury.  Today she rates pain at an 8/10.  She has not gotten much relief from Tylenol .    11/29/23: Vanessa [name] is a 63 y.o. female who presents today for follow-up evaluation of left shoulder pain.  Patient underwent 2 cortisone injections in January 2025, first on 1/9 with Dr. Jerri into the subacromial space followed by a glenohumeral injection by Dr. Burnetta on 1/31.  Reports that the second injection gave her more relief.  Recently she has noted increased, moderate pain levels within the shoulder as well as decreased range of motion.  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  She is left-hand dominant.   Surgical History:   None  PMH/PSH/Family History/Social History/Meds/Allergies:    Past Medical History:  Diagnosis Date   Anxiety    Arthritis    Chronic knee pain    Depression    Diverticulosis    Gall stone    Hepatitis C    Hx of Hepatitis C   Hypertension    Syphilis    Past Surgical History:  Procedure Laterality Date   ANAL RECTAL MANOMETRY N/A 02/20/2019   Procedure: ANO RECTAL MANOMETRY;  Surgeon: Teressa Toribio SQUIBB, MD;  Location: [facility];  Service: Endoscopy;  Laterality: N/A;   CARPAL TUNNEL RELEASE Left    CHOLECYSTECTOMY N/A 11/24/2014   Procedure: LAPAROSCOPIC CHOLECYSTECTOMY WITH INTRAOPERATIVE CHOLANGIOGRAM;  Surgeon: Camellia Blush, MD;  Location: [facility];  Service: General;  Laterality:  N/A;   ECTOPIC PREGNANCY SURGERY     TOTAL KNEE ARTHROPLASTY Right 03/15/2020   Procedure: TOTAL KNEE ARTHROPLASTY;  Surgeon: Yvone Rush, MD;  Location: [facility];  Service: Orthopedics;  Laterality: Right;   Social History   Socioeconomic History   Marital status: Single    Spouse name: Not on file   Number of children: 1   Years of education: Not on file   Highest education level: Not on file  Occupational History   Occupation: Dialysis Tech  Tobacco Use   Smoking status: Every Day    Current packs/day: 0.00    Average packs/day: 1 pack/day for 35.0 years (35.0 ttl pk-yrs)    Types: E-cigarettes, Cigarettes    Start date: 05/25/1994    Last attempt to quit: 03/03/2016    Years since quitting: 8.0   Smokeless tobacco: Never  Vaping Use   Vaping status: Former   Substances: Nicotine, Flavoring  Substance and Sexual Activity   Alcohol use: No    Alcohol/week: 0.0 standard drinks of alcohol    Comment: last alcohol was in the 1990's   Drug use: Not Currently    Types: Marijuana, Crack cocaine    Comment: last drug use was in the 1990's; never used heroin   Sexual activity:  Not Currently    Birth control/protection: Post-menopausal  Other Topics Concern   Not on file  Social History Narrative   Works at [employer] in [place]. Lives with daughter.   Social Drivers of Corporate investment banker Strain: Not on file  Food Insecurity: Not on file  Transportation Needs: Not on file  Physical Activity: Not on file  Stress: Not on file  Social Connections: Not on file   Family History  Problem Relation Age of Onset   Hypertension Mother    Diabetes Mother    Drug abuse Brother    Esophageal cancer Brother 51   Colon cancer Brother 33   Hypertension Sister        x's 2   Diabetes Sister        oldest sister   Stomach cancer Neg Hx    Breast cancer Neg Hx    Rectal cancer Neg Hx    No Known Allergies Current Outpatient Medications  Medication Sig Dispense  Refill   albuterol  (VENTOLIN  HFA) 108 (90 Base) MCG/ACT inhaler Inhale 2 puffs into the lungs every 4 (four) hours as needed.     ALPRAZolam  (XANAX ) 0.5 MG tablet TAKE 1 TABLET BY MOUTH TWICE A DAY AS NEEDED FOR ANXIETY 60 tablet 0   benazepril -hydrochlorthiazide (LOTENSIN  HCT) 10-12.5 MG tablet TAKE 1 TABLET BY MOUTH EVERY DAY 90 tablet 1   fluticasone  (FLONASE ) 50 MCG/ACT nasal spray SPRAY 2 SPRAYS INTO EACH NOSTRIL EVERY DAY 48 mL 1   hyoscyamine  (LEVSIN /SL) 0.125 MG SL tablet Place 1 tablet (0.125 mg total) under the tongue every 4 (four) hours as needed. 30 tablet 0   ketorolac  (TORADOL ) 10 MG tablet Take 1 tablet (10 mg total) by mouth every 6 (six) hours as needed. 20 tablet 0   Multiple Vitamin (MULTIVITAMIN WITH MINERALS) TABS tablet Take 1 tablet by mouth daily.     promethazine -dextromethorphan (PROMETHAZINE -DM) 6.25-15 MG/5ML syrup Take 5 mLs by mouth 4 (four) times daily as needed for cough. 240 mL 0   SUBOXONE  8-2 MG FILM Place 1 Film under the tongue 2 (two) times daily.      venlafaxine  XR (EFFEXOR -XR) 150 MG 24 hr capsule TAKE 1 CAPSULE BY MOUTH DAILY WITH BREAKFAST. TAKE IN ADDITION TO 75MG  FOR A TOTAL OF 225MG  DAILY 90 capsule 0   venlafaxine  XR (EFFEXOR -XR) 75 MG 24 hr capsule TAKE 1 CAPSULE BY MOUTH DAILY WITH BREAKFAST. 90 capsule 0   Vitamin D , Ergocalciferol , (DRISDOL ) 1.25 MG (50000 UNIT) CAPS capsule Take 1 capsule (50,000 Units total) by mouth every 7 (seven) days. 12 capsule 0   No current facility-administered medications for this visit.   No results found.  Review of Systems:   A ROS was performed including pertinent positives and negatives as documented in the HPI.  Physical Exam :   Constitutional: NAD and appears stated age Neurological: Alert and oriented Psych: Appropriate affect and cooperative There were no vitals taken for this visit.   Comprehensive Musculoskeletal Exam:    Tenderness with palpation of the left shoulder over the anterior  glenohumeral joint and AC joint.  She demonstrates 80 degrees of active forward elevation and abduction as well as 20 degrees ER and IR to back pocket.  No erythema or warmth.  Distal neurosensory exam intact.  Imaging:     Assessment:   63 y.o. female experiencing chronic left shoulder pain in the setting of osteoarthritis.  She has undergone cortisone injections in the past and has gotten much  more relief from glenohumeral as opposed to subacromial.  Pain suddenly returned 2 days ago without any known cause.  Her range of motion is significantly limited today which could suggest early adhesive capsulitis versus osteoarthritis flare.  Given that she has not gotten any relief with over-the-counter medications and symptoms are interfering with her ability to work, I have recommended repeating injection as it is now been over 3 months.  Glenohumeral injection was performed in the left shoulder under ultrasound guidance and she tolerated this well.  Recommend a follow-up within 2 weeks if pain or range of motion deficits persist post injection.  Plan :    - Left shoulder ultrasound-guided glenohumeral injection performed today - Follow-up in clinic as needed     Procedure Note  Patient: Vanessa [name]             Date of Birth: 04-04-1961           MRN: [id number]             Visit Date: 03/13/2024  Procedures: Visit Diagnoses:  1. Primary osteoarthritis, left shoulder     Large Joint Inj: L glenohumeral on 03/13/2024 9:32 AM Indications: pain Details: 22 G 1.5 in needle, ultrasound-guided anterior approach Medications: 4 mL lidocaine  1 %; 2 mL triamcinolone  acetonide 40 MG/ML Outcome: tolerated well, no immediate complications Procedure, treatment alternatives, risks and benefits explained, specific risks discussed. Consent was given by the patient. Immediately prior to procedure a time out was called to verify the correct patient, procedure, equipment, support staff and site/side marked  as required. Patient was prepped and draped in the usual sterile fashion.      I personally saw and evaluated the patient, and participated in the management and treatment plan.  Leonce Reveal, PA-C Orthopedics

## 2024-03-27 ENCOUNTER — Encounter: Payer: Self-pay | Admitting: Radiology

## 2024-05-07 ENCOUNTER — Other Ambulatory Visit: Payer: Self-pay | Admitting: Family Medicine

## 2024-05-07 DIAGNOSIS — F331 Major depressive disorder, recurrent, moderate: Secondary | ICD-10-CM

## 2024-05-13 ENCOUNTER — Other Ambulatory Visit: Payer: Self-pay | Admitting: Family Medicine

## 2024-05-15 NOTE — Telephone Encounter (Signed)
 Requested Prescriptions   Pending Prescriptions Disp Refills   ALPRAZolam  (XANAX ) 0.5 MG tablet [Pharmacy Med Name: ALPRAZOLAM  0.5 MG TABLET] 60 tablet 0    Sig: TAKE 1 TABLET BY MOUTH TWICE A DAY AS NEEDED FOR ANXIETY     Date of patient request: 05/15/24 Last office visit: 02/24/2024 Upcoming visit: Visit date not found Date of last refill: 03/03/24 Last refill amount: 60

## 2024-05-23 ENCOUNTER — Other Ambulatory Visit: Payer: Self-pay | Admitting: Family Medicine

## 2024-05-26 ENCOUNTER — Ambulatory Visit

## 2024-05-26 VITALS — Ht 61.0 in | Wt 169.0 lb

## 2024-05-26 DIAGNOSIS — Z1211 Encounter for screening for malignant neoplasm of colon: Secondary | ICD-10-CM

## 2024-05-26 DIAGNOSIS — Z800 Family history of malignant neoplasm of digestive organs: Secondary | ICD-10-CM

## 2024-05-26 MED ORDER — NA SULFATE-K SULFATE-MG SULF 17.5-3.13-1.6 GM/177ML PO SOLN: 1.0000 | Freq: Once | ORAL | 0 refills | Status: AC

## 2024-05-26 MED ORDER — ONDANSETRON HCL 4 MG PO TABS: 4.0000 mg | ORAL_TABLET | Freq: Three times a day (TID) | ORAL | 1 refills | Status: AC | PRN

## 2024-05-26 NOTE — Progress Notes (Signed)
 RN confirmed patient name, date of birth, and address RN confirmed date and time of procedure RN reviewed and confirmed allergies  RN reviewed and updated current medications; confirmed preferred pharmacy Pt is not on diet pills nor GLP-1 medications Pt is not on blood thinners RN reviewed medical & surgical hx  Pt denies issues with chronic constipation  Diabetic - no No A fib or A flutter No cardiac tests are pending  Pt is not on home 02  No issues known with past sedation with any surgeries or procedures Patient denies ever being told they had issues or difficulty with intubation  Patient unaware of any fh of malignant hyperthermia Ambulates independently RN reviewed prep instructions and explained time frames for holding certain medications RN answered patient questions; patient stated understanding Prep instructions sent

## 2024-06-01 ENCOUNTER — Encounter: Payer: Self-pay | Admitting: Internal Medicine

## 2024-06-03 ENCOUNTER — Other Ambulatory Visit: Payer: Self-pay | Admitting: Family Medicine

## 2024-06-06 ENCOUNTER — Telehealth: Payer: Self-pay | Admitting: Internal Medicine

## 2024-06-06 NOTE — Telephone Encounter (Signed)
 Spoke with pt. Start times for prep medication provided.

## 2024-06-06 NOTE — Telephone Encounter (Signed)
 Inbound call from patient stating she would like to speak to nurse in regards to prep instructions  Patient is scheduled for a procedure on 06/08/24  Requesting a call back  Please advise  Thank you

## 2024-06-08 ENCOUNTER — Ambulatory Visit (AMBULATORY_SURGERY_CENTER): Admitting: Internal Medicine

## 2024-06-08 ENCOUNTER — Encounter: Payer: Self-pay | Admitting: Internal Medicine

## 2024-06-08 VITALS — BP 151/76 | HR 72 | Temp 98.6°F | Resp 19

## 2024-06-08 DIAGNOSIS — Z800 Family history of malignant neoplasm of digestive organs: Secondary | ICD-10-CM | POA: Diagnosis not present

## 2024-06-08 DIAGNOSIS — K5730 Diverticulosis of large intestine without perforation or abscess without bleeding: Secondary | ICD-10-CM | POA: Diagnosis not present

## 2024-06-08 DIAGNOSIS — K648 Other hemorrhoids: Secondary | ICD-10-CM

## 2024-06-08 DIAGNOSIS — Z1211 Encounter for screening for malignant neoplasm of colon: Secondary | ICD-10-CM

## 2024-06-08 MED ORDER — SODIUM CHLORIDE 0.9 % IV SOLN: 500.0000 mL | Freq: Once | INTRAVENOUS | Status: DC

## 2024-06-08 NOTE — Progress Notes (Signed)
 Sedate, gd SR, tolerated procedure well, VSS, report to RN

## 2024-06-08 NOTE — Progress Notes (Signed)
 "   GASTROENTEROLOGY PROCEDURE H&P NOTE   Primary Care Physician: Mahlon Comer BRAVO, MD    Reason for Procedure:   Family history of colon cancer  Plan:    Colonoscopy  Patient is appropriate for endoscopic procedure(s) in the ambulatory ([facility]) setting.  The nature of the procedure, as well as the risks, benefits, and alternatives were carefully and thoroughly reviewed with the patient. Ample time for discussion and questions allowed. The patient understood, was satisfied, and agreed to proceed.     HPI: Vanessa [name] is a 64 y.o. female who presents for colonoscopy for family history of colon cancer. Denies blood in stools, changes in bowel habits, or unintentional weight loss. Brother had colon cancer at age 13. Last colonoscopy in 2020 just showed some diverticulosis.  Past Medical History:  Diagnosis Date   Anxiety    Arthritis    Chronic knee pain    Depression    Diverticulosis    Gall stone    Hepatitis C    Hx of Hepatitis C   Hypertension    Syphilis     Past Surgical History:  Procedure Laterality Date   ANAL RECTAL MANOMETRY N/A 02/20/2019   Procedure: ANO RECTAL MANOMETRY;  Surgeon: Teressa Toribio SQUIBB, MD;  Location: [facility];  Service: Endoscopy;  Laterality: N/A;   CARPAL TUNNEL RELEASE Left    CHOLECYSTECTOMY N/A 11/24/2014   Procedure: LAPAROSCOPIC CHOLECYSTECTOMY WITH INTRAOPERATIVE CHOLANGIOGRAM;  Surgeon: Camellia Blush, MD;  Location: [facility];  Service: General;  Laterality: N/A;   ECTOPIC PREGNANCY SURGERY     TOTAL KNEE ARTHROPLASTY Right 03/15/2020   Procedure: TOTAL KNEE ARTHROPLASTY;  Surgeon: Yvone Rush, MD;  Location: [facility];  Service: Orthopedics;  Laterality: Right;    Prior to Admission medications  Medication Sig Start Date End Date Taking? Authorizing Provider  ALPRAZolam  (XANAX ) 0.5 MG tablet TAKE 1 TABLET BY MOUTH TWICE A DAY AS NEEDED FOR ANXIETY 05/15/24  Yes Tabori, Katherine E, MD  benazepril -hydrochlorthiazide (LOTENSIN  HCT)  10-12.5 MG tablet TAKE 1 TABLET BY MOUTH EVERY DAY 06/05/24  Yes Tabori, Katherine E, MD  Multiple Vitamin (MULTIVITAMIN WITH MINERALS) TABS tablet Take 1 tablet by mouth daily.   Yes [provider]  ondansetron  (ZOFRAN ) 4 MG tablet Take 1 tablet (4 mg total) by mouth every 8 (eight) hours as needed for nausea or vomiting. 05/26/24  Yes Federico Rosario BROCKS, MD  SUBOXONE 8-2 MG FILM Place 1 Film under the tongue 2 (two) times daily.  04/18/13  Yes [provider]  venlafaxine  XR (EFFEXOR -XR) 150 MG 24 hr capsule TAKE 1 CAPSULE BY MOUTH DAILY WITH BREAKFAST. TAKE IN ADDITION TO 75MG  FOR A TOTAL OF 225MG  DAILY 05/23/24  Yes Mahlon Comer BRAVO, MD  venlafaxine  XR (EFFEXOR -XR) 75 MG 24 hr capsule TAKE 1 CAPSULE BY MOUTH DAILY WITH BREAKFAST. 05/08/24  Yes Mahlon Comer BRAVO, MD  albuterol  (VENTOLIN  HFA) 108 (90 Base) MCG/ACT inhaler Inhale 2 puffs into the lungs every 4 (four) hours as needed. Patient not taking: No sig reported 05/08/22   [provider]  fluticasone  (FLONASE ) 50 MCG/ACT nasal spray SPRAY 2 SPRAYS INTO EACH NOSTRIL EVERY DAY Patient not taking: No sig reported 03/30/22   Mahlon Comer BRAVO, MD  hyoscyamine  (LEVSIN AMIEL) 0.125 MG SL tablet Place 1 tablet (0.125 mg total) under the tongue every 4 (four) hours as needed. Patient not taking: No sig reported 07/09/23   Ula Prentice SAUNDERS, MD  ketorolac  (TORADOL ) 10 MG tablet Take 1 tablet (10 mg total)  by mouth every 6 (six) hours as needed. Patient not taking: No sig reported 07/09/23   Ula Prentice SAUNDERS, MD  promethazine -dextromethorphan (PROMETHAZINE -DM) 6.25-15 MG/5ML syrup Take 5 mLs by mouth 4 (four) times daily as needed for cough. Patient not taking: No sig reported 07/12/23   Rising, Asberry, PA-C  Vitamin D , Ergocalciferol , (DRISDOL ) 1.25 MG (50000 UNIT) CAPS capsule Take 1 capsule (50,000 Units total) by mouth every 7 (seven) days. Patient not taking: No sig reported 02/25/24   Mahlon Comer BRAVO, MD    Current  Outpatient Medications  Medication Sig Dispense Refill   ALPRAZolam  (XANAX ) 0.5 MG tablet TAKE 1 TABLET BY MOUTH TWICE A DAY AS NEEDED FOR ANXIETY 60 tablet 0   benazepril -hydrochlorthiazide (LOTENSIN  HCT) 10-12.5 MG tablet TAKE 1 TABLET BY MOUTH EVERY DAY 30 tablet 0   Multiple Vitamin (MULTIVITAMIN WITH MINERALS) TABS tablet Take 1 tablet by mouth daily.     ondansetron  (ZOFRAN ) 4 MG tablet Take 1 tablet (4 mg total) by mouth every 8 (eight) hours as needed for nausea or vomiting. 30 tablet 1   SUBOXONE 8-2 MG FILM Place 1 Film under the tongue 2 (two) times daily.      venlafaxine  XR (EFFEXOR -XR) 150 MG 24 hr capsule TAKE 1 CAPSULE BY MOUTH DAILY WITH BREAKFAST. TAKE IN ADDITION TO 75MG  FOR A TOTAL OF 225MG  DAILY 90 capsule 0   venlafaxine  XR (EFFEXOR -XR) 75 MG 24 hr capsule TAKE 1 CAPSULE BY MOUTH DAILY WITH BREAKFAST. 90 capsule 0   albuterol  (VENTOLIN  HFA) 108 (90 Base) MCG/ACT inhaler Inhale 2 puffs into the lungs every 4 (four) hours as needed. (Patient not taking: No sig reported)     fluticasone  (FLONASE ) 50 MCG/ACT nasal spray SPRAY 2 SPRAYS INTO EACH NOSTRIL EVERY DAY (Patient not taking: No sig reported) 48 mL 1   hyoscyamine  (LEVSIN /SL) 0.125 MG SL tablet Place 1 tablet (0.125 mg total) under the tongue every 4 (four) hours as needed. (Patient not taking: No sig reported) 30 tablet 0   ketorolac  (TORADOL ) 10 MG tablet Take 1 tablet (10 mg total) by mouth every 6 (six) hours as needed. (Patient not taking: No sig reported) 20 tablet 0   promethazine -dextromethorphan (PROMETHAZINE -DM) 6.25-15 MG/5ML syrup Take 5 mLs by mouth 4 (four) times daily as needed for cough. (Patient not taking: No sig reported) 240 mL 0   Vitamin D , Ergocalciferol , (DRISDOL ) 1.25 MG (50000 UNIT) CAPS capsule Take 1 capsule (50,000 Units total) by mouth every 7 (seven) days. (Patient not taking: No sig reported) 12 capsule 0   Current Facility-Administered Medications  Medication Dose Route Frequency Provider  Last Rate Last Admin   0.9 %  sodium chloride  infusion  500 mL Intravenous Once Shalynn Jorstad C, MD        Allergies as of 06/08/2024   (No Known Allergies)    Family History  Problem Relation Age of Onset   Hypertension Mother    Diabetes Mother    Drug abuse Brother    Esophageal cancer Brother 48   Colon cancer Brother 56   Hypertension Sister        x's 2   Diabetes Sister        oldest sister   Stomach cancer Neg Hx    Breast cancer Neg Hx    Rectal cancer Neg Hx     Social History   Socioeconomic History   Marital status: Single    Spouse name: Not on file   Number of children: 1  Years of education: Not on file   Highest education level: Not on file  Occupational History   Occupation: Dialysis Tech  Tobacco Use   Smoking status: Every Day    Current packs/day: 0.00    Average packs/day: 1 pack/day for 35.0 years (35.0 ttl pk-yrs)    Types: E-cigarettes, Cigarettes    Start date: 05/25/1994    Last attempt to quit: 03/03/2016    Years since quitting: 8.2   Smokeless tobacco: Never  Vaping Use   Vaping status: Former   Substances: Nicotine, Flavoring  Substance and Sexual Activity   Alcohol use: No    Alcohol/week: 0.0 standard drinks of alcohol    Comment: last alcohol was in the 1990's   Drug use: Not Currently    Types: Marijuana, Crack cocaine    Comment: last drug use was in the 1990's; never used heroin   Sexual activity: Not Currently    Birth control/protection: Post-menopausal  Other Topics Concern   Not on file  Social History Narrative   Works at [employer] in [place]. Lives with daughter.   Social Drivers of Health   Tobacco Use: High Risk (06/08/2024)   Patient History    Smoking Tobacco Use: Every Day    Smokeless Tobacco Use: Never    Passive Exposure: Not on file  Financial Resource Strain: Not on file  Food Insecurity: Not on file  Transportation Needs: Not on file  Physical Activity: Not on file  Stress: Not on file   Social Connections: Not on file  Intimate Partner Violence: Not on file  Depression (PHQ2-9): Low Risk (02/24/2024)   Depression (PHQ2-9)    PHQ-2 Score: 0  Alcohol Screen: Not on file  Housing: Not on file  Utilities: Not on file  Health Literacy: Not on file    Physical Exam: Vital signs in last 24 hours: Temp 98.6 F (37 C)  GEN: NAD EYE: Sclerae anicteric ENT: MMM CV: Non-tachycardic Pulm: No increased work of breathing GI: Soft, NT/ND NEURO:  Alert & Oriented   Estefana Kidney, MD [facility]  06/08/2024 1:52 PM  "

## 2024-06-08 NOTE — Op Note (Addendum)
 [facility] Patient Name: Vanessa [name] Procedure Date: 06/08/2024 2:27 PM MRN: [id number] Endoscopist: Rosario Estefana Kidney , , [id number] Age: 64 Referring MD:  Date of Birth: June 28, 1960 Gender: Female Account #: [id number] Procedure:                Colonoscopy Indications:              Screening in patient at increased risk: Family                            history of 1st-degree relative with colorectal                            cancer before age 78 years Medicines:                Monitored Anesthesia Care Procedure:                Pre-Anesthesia Assessment:                           - Prior to the procedure, a History and Physical                            was performed, and patient medications and                            allergies were reviewed. The patient's tolerance of                            previous anesthesia was also reviewed. The risks                            and benefits of the procedure and the sedation                            options and risks were discussed with the patient.                            All questions were answered, and informed consent                            was obtained. Prior Anticoagulants: The patient has                            taken no anticoagulant or antiplatelet agents. ASA                            Grade Assessment: II - A patient with mild systemic                            disease. After reviewing the risks and benefits,                            the patient was deemed in satisfactory condition to  undergo the procedure.                           After obtaining informed consent, the colonoscope                            was passed under direct vision. Throughout the                            procedure, the patient's blood pressure, pulse, and                            oxygen saturations were monitored continuously. The                            CF [id number] #[id number] was  introduced through the anus                            and advanced to the the cecum, identified by                            appendiceal orifice and ileocecal valve. The                            colonoscopy was performed without difficulty. The                            patient tolerated the procedure well. The quality                            of the bowel preparation was inadequate. The                            ileocecal valve, appendiceal orifice, and rectum                            were photographed. Scope In: 2:45:23 PM Scope Out: 3:03:23 PM Scope Withdrawal Time: 0 hours 8 minutes 25 seconds  Total Procedure Duration: 0 hours 18 minutes 0 seconds  Findings:                 Semi-solid stool was found in the descending colon,                            in the ascending colon and in the cecum,                            interfering with visualization.                           Multiple diverticula were found in the sigmoid                            colon.  Non-bleeding internal hemorrhoids were found during                            retroflexion. Complications:            No immediate complications. Estimated Blood Loss:     Estimated blood loss: none. Impression:               - Preparation of the colon was inadequate.                           - Stool in the descending colon, in the ascending                            colon and in the cecum.                           - Diverticulosis in the sigmoid colon.                           - Non-bleeding internal hemorrhoids.                           - No specimens collected. Recommendation:           - Discharge patient to home (with escort).                           - Repeat colonoscopy at the next available                            appointment with 2-day bowel prep because the bowel                            preparation was poor.                           - The findings and recommendations were  discussed                            with the patient. Dr Estefana Federico Rosario Estefana Federico,  06/08/2024 3:06:19 PM

## 2024-06-08 NOTE — Patient Instructions (Signed)
 Please read handouts provided. Repeat colonoscopy.   YOU HAD AN ENDOSCOPIC PROCEDURE TODAY AT THE [facility]:   Refer to the procedure report that was given to you for any specific questions about what was found during the examination.  If the procedure report does not answer your questions, please call your gastroenterologist to clarify.  If you requested that your care partner not be given the details of your procedure findings, then the procedure report has been included in a sealed envelope for you to review at your convenience later.  YOU SHOULD EXPECT: Some feelings of bloating in the abdomen. Passage of more gas than usual.  Walking can help get rid of the air that was put into your GI tract during the procedure and reduce the bloating. If you had a lower endoscopy (such as a colonoscopy or flexible sigmoidoscopy) you may notice spotting of blood in your stool or on the toilet paper. If you underwent a bowel prep for your procedure, you may not have a normal bowel movement for a few days.  Please Note:  You might notice some irritation and congestion in your nose or some drainage.  This is from the oxygen used during your procedure.  There is no need for concern and it should clear up in a day or so.  SYMPTOMS TO REPORT IMMEDIATELY:  Following lower endoscopy (colonoscopy or flexible sigmoidoscopy):  Excessive amounts of blood in the stool  Significant tenderness or worsening of abdominal pains  Swelling of the abdomen that is new, acute  Fever of 100F or higher.  For urgent or emergent issues, a gastroenterologist can be reached at any hour by calling [phone]. Do not use MyChart messaging for urgent concerns.    DIET:  We do recommend a small meal at first, but then you may proceed to your regular diet.  Drink plenty of fluids but you should avoid alcoholic beverages for 24 hours.  ACTIVITY:  You should plan to take it easy for the rest of today and you should  NOT DRIVE or use heavy machinery until tomorrow (because of the sedation medicines used during the test).    FOLLOW UP: Our staff will call the number listed on your records the next business day following your procedure.  We will call around 7:15- 8:00 am to check on you and address any questions or concerns that you may have regarding the information given to you following your procedure. If we do not reach you, we will leave a message.     If any biopsies were taken you will be contacted by phone or by letter within the next 1-3 weeks.  Please call us  at [phone] if you have not heard about the biopsies in 3 weeks.    SIGNATURES/CONFIDENTIALITY: You and/or your care partner have signed paperwork which will be entered into your electronic medical record.  These signatures attest to the fact that that the information above on your After Visit Summary has been reviewed and is understood.  Full responsibility of the confidentiality of this discharge information lies with you and/or your care-partner.

## 2024-06-09 ENCOUNTER — Telehealth: Payer: Self-pay

## 2024-06-09 NOTE — Telephone Encounter (Signed)
" °  Follow up Call-     06/08/2024    1:21 PM  Call back number  Post procedure Call Back phone  # [phone]  Permission to leave phone message Yes     Patient questions:  Do you have a fever, pain , or abdominal swelling? No. Pain Score  0 *  Have you tolerated food without any problems? Yes.    Have you been able to return to your normal activities? Yes.    Do you have any questions about your discharge instructions: Diet   No. Medications  No. Follow up visit  No.  Do you have questions or concerns about your Care? No.  Actions: * If pain score is 4 or above: No action needed, pain <4.   "

## 2024-06-20 ENCOUNTER — Encounter

## 2024-06-20 ENCOUNTER — Telehealth: Payer: Self-pay

## 2024-06-20 NOTE — Telephone Encounter (Signed)
 RN called to complete PV on 06/20/2024 at 2:30pm. Patient reports she is at work and forgot about pre-visit appt.  RN rescheduled phone pre-visit appt. for 01/29 at 1:00 PM. Patient verbalizes understanding.

## 2024-06-22 ENCOUNTER — Encounter: Payer: Self-pay | Admitting: Internal Medicine

## 2024-06-22 ENCOUNTER — Ambulatory Visit

## 2024-06-22 VITALS — Ht 61.0 in | Wt 170.0 lb

## 2024-06-22 DIAGNOSIS — Z800 Family history of malignant neoplasm of digestive organs: Secondary | ICD-10-CM

## 2024-06-22 DIAGNOSIS — Z1211 Encounter for screening for malignant neoplasm of colon: Secondary | ICD-10-CM

## 2024-06-22 MED ORDER — NA SULFATE-K SULFATE-MG SULF 17.5-3.13-1.6 GM/177ML PO SOLN: 1.0000 | Freq: Once | ORAL | 0 refills | Status: AC

## 2024-06-22 NOTE — Progress Notes (Signed)
 PREP INSTRUCTIONS THOROUGHLY  REVIEWED WITH PATIENT. PATIENT VERBALIZES UNDERSTANDING. RN INFORMED PATIENT TO CALL IF FURTHER CLARIFICATION NEEDED ON PREP, PT VERBALIZES UNDERSTANDING.  No egg or soy allergy known to patient  No issues known to pt with past sedation with any surgeries or procedures Patient denies ever being told they had issues or difficulty with intubation  No FH of Malignant Hyperthermia Pt is not on diet pills Pt is not on  home 02  Pt is not on blood thinners  Pt denies issues with constipation  No A fib or A flutter Have any cardiac testing pending--NO Pt can ambulate-INDEPENDENTLY  Pt denies use of chewing tobacco Discussed diabetic I weight loss medication holds Discussed NSAID holds Checked BMI Pt instructed to use Singlecare.com or GoodRx for a price reduction on prep  Patient's chart reviewed by Norleen Schillings CNRA prior to previsit and patient appropriate for the [facility].  Pre visit completed and red dot placed by patient's name on their procedure day (on provider's schedule).

## 2024-06-30 ENCOUNTER — Other Ambulatory Visit: Payer: Self-pay | Admitting: Family Medicine

## 2024-07-04 ENCOUNTER — Encounter: Admitting: Internal Medicine

## 2024-07-11 ENCOUNTER — Encounter: Admitting: Internal Medicine
